# Patient Record
Sex: Female | Born: 1965 | Race: White | Hispanic: No | Marital: Married | Smoking: Current every day smoker
Health system: Southern US, Community
[De-identification: ages and names within clinical notes are randomized; demographics above are authoritative.]

## PROBLEM LIST (undated history)

## (undated) DIAGNOSIS — I219 Acute myocardial infarction, unspecified: Secondary | ICD-10-CM

## (undated) DIAGNOSIS — M5430 Sciatica, unspecified side: Secondary | ICD-10-CM

## (undated) DIAGNOSIS — M722 Plantar fascial fibromatosis: Secondary | ICD-10-CM

## (undated) DIAGNOSIS — M549 Dorsalgia, unspecified: Secondary | ICD-10-CM

## (undated) DIAGNOSIS — I10 Essential (primary) hypertension: Secondary | ICD-10-CM

## (undated) DIAGNOSIS — F419 Anxiety disorder, unspecified: Secondary | ICD-10-CM

## (undated) DIAGNOSIS — G8929 Other chronic pain: Secondary | ICD-10-CM

## (undated) HISTORY — PX: TUBAL LIGATION: SHX77

## (undated) HISTORY — DX: Sciatica, unspecified side: M54.30

## (undated) HISTORY — DX: Other chronic pain: M54.9

## (undated) HISTORY — DX: Other chronic pain: G89.29

## (undated) HISTORY — PX: APPENDECTOMY: SHX54

## (undated) HISTORY — PX: CARDIAC CATHETERIZATION: SHX172

## (undated) HISTORY — DX: Plantar fascial fibromatosis: M72.2

## (undated) HISTORY — DX: Anxiety disorder, unspecified: F41.9

## (undated) HISTORY — DX: Essential (primary) hypertension: I10

---

## 2000-10-20 ENCOUNTER — Emergency Department (HOSPITAL_COMMUNITY): Admission: EM | Admit: 2000-10-20 | Discharge: 2000-10-20 | Payer: Self-pay | Admitting: Dermatology

## 2003-05-11 ENCOUNTER — Emergency Department (HOSPITAL_COMMUNITY): Admission: EM | Admit: 2003-05-11 | Discharge: 2003-05-11 | Payer: Self-pay | Admitting: Emergency Medicine

## 2011-03-16 ENCOUNTER — Emergency Department (HOSPITAL_COMMUNITY)
Admission: EM | Admit: 2011-03-16 | Discharge: 2011-03-16 | Disposition: A | Discharge status: Home / Self Care | Payer: Medicaid Other | Attending: Emergency Medicine | Admitting: Emergency Medicine

## 2011-03-16 ENCOUNTER — Encounter: Payer: Self-pay | Admitting: *Deleted

## 2011-03-16 ENCOUNTER — Emergency Department (HOSPITAL_COMMUNITY): Payer: Medicaid Other

## 2011-03-16 ENCOUNTER — Other Ambulatory Visit: Payer: Self-pay

## 2011-03-16 DIAGNOSIS — R110 Nausea: Secondary | ICD-10-CM | POA: Insufficient documentation

## 2011-03-16 DIAGNOSIS — R079 Chest pain, unspecified: Secondary | ICD-10-CM | POA: Insufficient documentation

## 2011-03-16 DIAGNOSIS — R062 Wheezing: Secondary | ICD-10-CM

## 2011-03-16 DIAGNOSIS — R42 Dizziness and giddiness: Secondary | ICD-10-CM | POA: Insufficient documentation

## 2011-03-16 DIAGNOSIS — F17200 Nicotine dependence, unspecified, uncomplicated: Secondary | ICD-10-CM | POA: Insufficient documentation

## 2011-03-16 DIAGNOSIS — R0602 Shortness of breath: Secondary | ICD-10-CM | POA: Insufficient documentation

## 2011-03-16 DIAGNOSIS — I10 Essential (primary) hypertension: Secondary | ICD-10-CM | POA: Insufficient documentation

## 2011-03-16 LAB — CARDIAC PANEL(CRET KIN+CKTOT+MB+TROPI): Relative Index: INVALID (ref 0.0–2.5)

## 2011-03-16 LAB — CBC
HCT: 39.8 % (ref 36.0–46.0)
Hemoglobin: 13.2 g/dL (ref 12.0–15.0)
MCHC: 33.2 g/dL (ref 30.0–36.0)
MCV: 91.1 fL (ref 78.0–100.0)
RDW: 13.9 % (ref 11.5–15.5)
WBC: 7.5 10*3/uL (ref 4.0–10.5)

## 2011-03-16 LAB — BASIC METABOLIC PANEL
BUN: 8 mg/dL (ref 6–23)
CO2: 23 mEq/L (ref 19–32)
Calcium: 9.1 mg/dL (ref 8.4–10.5)
Chloride: 111 mEq/L (ref 96–112)
Creatinine, Ser: 0.67 mg/dL (ref 0.50–1.10)

## 2011-03-16 LAB — DIFFERENTIAL
Basophils Absolute: 0 10*3/uL (ref 0.0–0.1)
Eosinophils Relative: 4 % (ref 0–5)
Lymphocytes Relative: 25 % (ref 12–46)
Monocytes Absolute: 0.6 10*3/uL (ref 0.1–1.0)
Monocytes Relative: 8 % (ref 3–12)
Neutro Abs: 4.7 10*3/uL (ref 1.7–7.7)

## 2011-03-16 MED ORDER — ASPIRIN 81 MG PO CHEW: 324.0000 mg | CHEWABLE_TABLET | Freq: Once | ORAL | Status: DC

## 2011-03-16 MED ORDER — ALBUTEROL SULFATE (5 MG/ML) 0.5% IN NEBU
2.5000 mg | INHALATION_SOLUTION | Freq: Once | RESPIRATORY_TRACT | Status: AC
  Administered 2011-03-16: 2.5 mg via RESPIRATORY_TRACT
  Filled 2011-03-16: qty 0.5

## 2011-03-16 MED ORDER — ALBUTEROL SULFATE HFA 108 (90 BASE) MCG/ACT IN AERS: 1.0000 | INHALATION_SPRAY | Freq: Four times a day (QID) | RESPIRATORY_TRACT | Status: AC | PRN

## 2011-03-16 NOTE — ED Notes (Signed)
Pt awoken with left sided chest pain.  States has since resolved.  Per EMS pain did radiate to left side of neck.

## 2011-03-16 NOTE — ED Notes (Signed)
Edp at bedside °

## 2011-03-16 NOTE — ED Notes (Signed)
Paged respiratory 

## 2011-03-16 NOTE — ED Provider Notes (Signed)
Scribed for [employer]. Colon Branch, MD, the patient was seen in room APA14/APA14 . This chart was scribed by Ellie Lunch.   CSN: [id number] Arrival date & time: 03/16/2011  5:54 AM   First MD Initiated Contact with Patient 03/16/11 [phone]      Chief Complaint  Patient presents with  . Chest Pain    (Consider location/radiation/quality/duration/timing/severity/associated sxs/prior treatment) Patient is a 45 y.o. female presenting with chest pain. The history is provided by the patient. No language interpreter was used.  Chest Pain   Pt seen at 7:18 AM Jean Nelson is a 45 y.o. female brought in by ambulance, who presents to the Emergency Department complaining of 1 episode of left sided chest pain. Pt c/o a sharp, stabbing pain that awoke her ~3.5 hours ago. Pt was in supine position when pain started in her left axilla and radiated to her left arm.  Cp was associated with SOB, dizziness, and nausea. Pain was made worse by movement, but then resolved when she went for a walk outside. Pt denies vomiting, fever, or chills. Pt currently reports no CP. Additionally Pt reports some wheezing. Pt is a daily smoker ~1pack/day.   PCP Dr. Denzil Magnuson Past Medical History  Diagnosis Date  . Hypertension     History reviewed. No pertinent past surgical history.  No family history on file.  History  Substance Use Topics  . Smoking status: Current Everyday Smoker  . Smokeless tobacco: Not on file  . Alcohol Use: No    Review of Systems  All other systems reviewed and are negative.  10 Systems reviewed and are negative for acute change except as noted in the HPI.  Allergies  Codeine  Home Medications   Current Outpatient Rx  Name Route Sig Dispense Refill  . HYDROCODONE-ACETAMINOPHEN 5-325 MG PO TABS Oral Take 1 tablet by mouth every 6 (six) hours as needed.      . IBUPROFEN 800 MG PO TABS Oral Take 800 mg by mouth every 8 (eight) hours as needed.      Marland Kitchen LISINOPRIL 10 MG PO TABS Oral Take 20  mg by mouth daily.      . MELOXICAM 7.5 MG PO TABS Oral Take 7.5 mg by mouth daily.        BP 143/73  Pulse 68  Temp(Src) 98 F (36.7 C) (Oral)  Resp 18  Ht 5\' 3"  (1.6 m)  Wt 171 lb (77.565 kg)  BMI 30.29 kg/m2  SpO2 98%  LMP 02/16/2011  Physical Exam  Nursing note and vitals reviewed. Constitutional: She is oriented to person, place, and time. She appears well-developed and well-nourished.  HENT:  Right Ear: Tympanic membrane and external ear normal.  Left Ear: Tympanic membrane and external ear normal.  Mouth/Throat: Oropharynx is clear and moist.  Eyes: EOM are normal. Pupils are equal, round, and reactive to light.  Neck: Neck supple.  Cardiovascular: Normal rate, regular rhythm and normal heart sounds.  Exam reveals no gallop and no friction rub.   No murmur heard. Pulmonary/Chest: Effort normal. She has wheezes (right greater than left).  Abdominal: Soft. Bowel sounds are normal. There is no tenderness.  Musculoskeletal: Normal range of motion. She exhibits no edema.  Neurological: She is alert and oriented to person, place, and time.  Skin: Skin is warm and dry.    ED Course  Procedures (including critical care time) OTHER DATA REVIEWED: Nursing notes, vital signs, and past medical records reviewed.   DIAGNOSTIC STUDIES: Oxygen Saturation is 98%  on room air, normal by my interpretation.     Results for orders placed during the hospital encounter of 03/16/11  CBC      Component Value Range   WBC 7.5  4.0 - 10.5 (K/uL)   RBC 4.37  3.87 - 5.11 (MIL/uL)   Hemoglobin 13.2  12.0 - 15.0 (g/dL)   HCT 40.9  81.1 - 91.4 (%)   MCV 91.1  78.0 - 100.0 (fL)   MCH 30.2  26.0 - 34.0 (pg)   MCHC 33.2  30.0 - 36.0 (g/dL)   RDW 78.2  95.6 - 21.3 (%)   Platelets 205  150 - 400 (K/uL)  DIFFERENTIAL      Component Value Range   Neutrophils Relative 62  43 - 77 (%)   Neutro Abs 4.7  1.7 - 7.7 (K/uL)   Lymphocytes Relative 25  12 - 46 (%)   Lymphs Abs 1.9  0.7 - 4.0 (K/uL)     Monocytes Relative 8  3 - 12 (%)   Monocytes Absolute 0.6  0.1 - 1.0 (K/uL)   Eosinophils Relative 4  0 - 5 (%)   Eosinophils Absolute 0.3  0.0 - 0.7 (K/uL)   Basophils Relative 0  0 - 1 (%)   Basophils Absolute 0.0  0.0 - 0.1 (K/uL)  BASIC METABOLIC PANEL      Component Value Range   Sodium 141  135 - 145 (mEq/L)   Potassium 3.4 (*) 3.5 - 5.1 (mEq/L)   Chloride 111  96 - 112 (mEq/L)   CO2 23  19 - 32 (mEq/L)   Glucose, Bld 108 (*) 70 - 99 (mg/dL)   BUN 8  6 - 23 (mg/dL)   Creatinine, Ser 0.86  0.50 - 1.10 (mg/dL)   Calcium 9.1  8.4 - 57.8 (mg/dL)   GFR calc non Af Amer >90  >90 (mL/min)   GFR calc Af Amer >90  >90 (mL/min)  CARDIAC PANEL(CRET KIN+CKTOT+MB+TROPI)      Component Value Range   Total CK 97  7 - 177 (U/L)   CK, MB 2.6  0.3 - 4.0 (ng/mL)   Troponin I <0.30  <0.30 (ng/mL)   Relative Index RELATIVE INDEX IS INVALID  0.0 - 2.5    Dg Chest 2 View  03/16/2011  *RADIOLOGY REPORT*  Clinical Data: Chest pain.  CHEST - 2 VIEW  Comparison: Report from 05/11/2003  Findings: Cardiac and mediastinal contours appear normal.  The lungs appear clear.  No pleural effusion is identified.  IMPRESSION:  No significant abnormality identified.  Original Report Authenticated By: Dellia Cloud, M.D.    Date: 03/16/2011  0554  Rate: 78  Rhythm: normal sinus rhythm  QRS Axis: normal  Intervals: normal  ST/T Wave abnormalities: normal  Conduction Disutrbances:none  Narrative Interpretation:   Old EKG Reviewed: none available  ED MEDICATIONS  Medications  aspirin chewable tablet 324 mg (324 mg Oral Not Given 03/16/11 0615)  albuterol (PROVENTIL) (5 MG/ML) 0.5% nebulizer solution 2.5 mg (2.5 mg Nebulization Given 03/16/11 0834)   9:08 AM Pt recheck. Pt feels improved after breathing treatment. Discussed labs and xray look good. Plan to discharge with inhaler.     MDM  Patient with sharp left axillary pain that radiated to the left neck. Pain resolved on its own. PE with  wheezing throughout. Improved with albuterol. Pt feels improved after observation and/or treatment in ED.Pt stable in ED with no significant deterioration in condition.The patient appears reasonably screened and/or stabilized for discharge and  I doubt any other medical condition or other [facility] requiring further screening, evaluation, or treatment in the ED at this time prior to discharge. MDM Reviewed: nursing note and vitals Interpretation: labs, ECG and x-ray    I personally performed the services described in this documentation, which was scribed in my presence. The recorded information has been reviewed and considered.       Nicoletta Dress. Colon Branch, MD 03/16/11 [phone]

## 2013-11-29 ENCOUNTER — Telehealth (HOSPITAL_COMMUNITY): Payer: Self-pay

## 2014-01-18 ENCOUNTER — Ambulatory Visit (INDEPENDENT_AMBULATORY_CARE_PROVIDER_SITE_OTHER): Payer: 59 | Admitting: Psychiatry

## 2014-01-18 ENCOUNTER — Encounter (HOSPITAL_COMMUNITY): Payer: Self-pay | Admitting: Psychiatry

## 2014-01-18 VITALS — BP 180/90 | Ht 63.0 in | Wt 176.0 lb

## 2014-01-18 DIAGNOSIS — F909 Attention-deficit hyperactivity disorder, unspecified type: Secondary | ICD-10-CM

## 2014-01-18 DIAGNOSIS — F902 Attention-deficit hyperactivity disorder, combined type: Secondary | ICD-10-CM

## 2014-01-18 DIAGNOSIS — F411 Generalized anxiety disorder: Secondary | ICD-10-CM | POA: Insufficient documentation

## 2014-01-18 MED ORDER — AMPHETAMINE-DEXTROAMPHETAMINE 20 MG PO TABS: 20.0000 mg | ORAL_TABLET | Freq: Four times a day (QID) | ORAL | Status: DC

## 2014-01-18 MED ORDER — ALPRAZOLAM 2 MG PO TABS: 2.0000 mg | ORAL_TABLET | Freq: Two times a day (BID) | ORAL | Status: DC
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
## 2014-02-08 ENCOUNTER — Ambulatory Visit (INDEPENDENT_AMBULATORY_CARE_PROVIDER_SITE_OTHER): Payer: MEDICAID | Admitting: Psychiatry

## 2014-02-08 ENCOUNTER — Encounter (HOSPITAL_COMMUNITY): Payer: Self-pay | Admitting: Psychiatry

## 2014-02-08 DIAGNOSIS — F411 Generalized anxiety disorder: Secondary | ICD-10-CM

## 2014-02-08 DIAGNOSIS — F902 Attention-deficit hyperactivity disorder, combined type: Secondary | ICD-10-CM
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
## 2014-02-08 NOTE — Patient Instructions (Signed)
Discussed orally 

## 2014-02-15 ENCOUNTER — Ambulatory Visit (HOSPITAL_COMMUNITY): Payer: Self-pay | Admitting: Psychiatry

## 2014-02-15 ENCOUNTER — Ambulatory Visit (INDEPENDENT_AMBULATORY_CARE_PROVIDER_SITE_OTHER): Payer: MEDICAID | Admitting: Psychiatry

## 2014-02-15 ENCOUNTER — Encounter (HOSPITAL_COMMUNITY): Payer: Self-pay | Admitting: Psychiatry

## 2014-02-15 VITALS — BP 159/87 | HR 84 | Ht 63.0 in | Wt 177.0 lb

## 2014-02-15 DIAGNOSIS — F411 Generalized anxiety disorder: Secondary | ICD-10-CM

## 2014-02-15 DIAGNOSIS — F902 Attention-deficit hyperactivity disorder, combined type: Secondary | ICD-10-CM

## 2014-02-15 MED ORDER — ALPRAZOLAM 2 MG PO TABS: 2.0000 mg | ORAL_TABLET | Freq: Two times a day (BID) | ORAL | Status: DC

## 2014-02-15 MED ORDER — AMPHETAMINE-DEXTROAMPHETAMINE 20 MG PO TABS: 20.0000 mg | ORAL_TABLET | Freq: Four times a day (QID) | ORAL | Status: DC

## 2014-02-15 MED ORDER — BUPROPION HCL ER (XL) 150 MG PO TB24
150.0000 mg | ORAL_TABLET | ORAL | Status: DC
Start: 2014-02-15 — End: 2014-04-17
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
## 2014-03-06 ENCOUNTER — Ambulatory Visit (HOSPITAL_COMMUNITY): Payer: Self-pay | Admitting: Psychiatry

## 2014-03-27 ENCOUNTER — Ambulatory Visit (HOSPITAL_COMMUNITY): Payer: MEDICAID | Admitting: Psychiatry

## 2014-04-17 ENCOUNTER — Encounter (HOSPITAL_COMMUNITY): Payer: Self-pay | Admitting: Psychiatry

## 2014-04-17 ENCOUNTER — Ambulatory Visit (INDEPENDENT_AMBULATORY_CARE_PROVIDER_SITE_OTHER): Payer: MEDICAID | Admitting: Psychiatry

## 2014-04-17 VITALS — BP 166/96 | HR 60 | Ht 63.0 in | Wt 181.6 lb

## 2014-04-17 DIAGNOSIS — F902 Attention-deficit hyperactivity disorder, combined type: Secondary | ICD-10-CM

## 2014-04-17 DIAGNOSIS — F411 Generalized anxiety disorder: Secondary | ICD-10-CM

## 2014-04-17 MED ORDER — AMPHETAMINE-DEXTROAMPHETAMINE 20 MG PO TABS: 20.0000 mg | ORAL_TABLET | Freq: Four times a day (QID) | ORAL | Status: DC

## 2014-04-17 MED ORDER — ALPRAZOLAM 2 MG PO TABS: 2.0000 mg | ORAL_TABLET | Freq: Two times a day (BID) | ORAL | Status: DC

## 2014-04-17 MED ORDER — AMPHETAMINE-DEXTROAMPHETAMINE 20 MG PO TABS: 20.0000 mg | ORAL_TABLET | Freq: Every day | ORAL | Status: DC

## 2014-04-17 MED ORDER — BUPROPION HCL ER (XL) 150 MG PO TB24: 150.0000 mg | ORAL_TABLET | ORAL | Status: DC
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
## 2014-06-08 ENCOUNTER — Telehealth (HOSPITAL_COMMUNITY): Payer: Self-pay | Admitting: *Deleted

## 2014-06-08 ENCOUNTER — Other Ambulatory Visit (HOSPITAL_COMMUNITY): Payer: Self-pay | Admitting: Psychiatry

## 2014-06-08 MED ORDER — AMPHETAMINE-DEXTROAMPHETAMINE 20 MG PO TABS: 20.0000 mg | ORAL_TABLET | Freq: Four times a day (QID) | ORAL | Status: DC

## 2014-06-08 NOTE — Telephone Encounter (Signed)
last prescription written ( Adderall) can't be filled. patient says she take 4 a day.  script says take 1 a day.

## 2014-06-08 NOTE — Telephone Encounter (Signed)
reprinted

## 2014-06-08 NOTE — Telephone Encounter (Signed)
Pt is aware printed script is ready for pick up and per pt she will come by office 06-11-14 to pick up printed script

## 2014-06-11 ENCOUNTER — Telehealth (HOSPITAL_COMMUNITY): Payer: Self-pay | Admitting: *Deleted

## 2014-06-11 NOTE — Telephone Encounter (Signed)
Pt came to pick up printed script. Pt D/L is [id number]. Pt also dropped off extra printed script for her Adderall that had the wrong SIG. Pt agreed with printed script.

## 2014-07-17 ENCOUNTER — Ambulatory Visit (INDEPENDENT_AMBULATORY_CARE_PROVIDER_SITE_OTHER): Payer: MEDICAID | Admitting: Psychiatry

## 2014-07-17 ENCOUNTER — Encounter (HOSPITAL_COMMUNITY): Payer: Self-pay | Admitting: Psychiatry

## 2014-07-17 VITALS — BP 178/100 | HR 76 | Ht 63.0 in | Wt 183.0 lb

## 2014-07-17 DIAGNOSIS — F902 Attention-deficit hyperactivity disorder, combined type: Secondary | ICD-10-CM

## 2014-07-17 DIAGNOSIS — F411 Generalized anxiety disorder: Secondary | ICD-10-CM

## 2014-07-17 MED ORDER — ALPRAZOLAM 2 MG PO TABS: 2.0000 mg | ORAL_TABLET | Freq: Two times a day (BID) | ORAL | Status: DC

## 2014-07-17 MED ORDER — AMPHETAMINE-DEXTROAMPHETAMINE 20 MG PO TABS: 20.0000 mg | ORAL_TABLET | Freq: Four times a day (QID) | ORAL | Status: DC

## 2014-07-17 MED ORDER — BUPROPION HCL ER (XL) 150 MG PO TB24: 150.0000 mg | ORAL_TABLET | ORAL | Status: DC
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
## 2014-10-09 ENCOUNTER — Ambulatory Visit (INDEPENDENT_AMBULATORY_CARE_PROVIDER_SITE_OTHER): Payer: MEDICAID | Admitting: Psychiatry

## 2014-10-09 ENCOUNTER — Encounter (HOSPITAL_COMMUNITY): Payer: Self-pay | Admitting: Psychiatry

## 2014-10-09 VITALS — BP 171/99 | HR 80 | Ht 63.0 in | Wt 176.4 lb

## 2014-10-09 DIAGNOSIS — F411 Generalized anxiety disorder: Secondary | ICD-10-CM

## 2014-10-09 DIAGNOSIS — F902 Attention-deficit hyperactivity disorder, combined type: Secondary | ICD-10-CM | POA: Diagnosis not present

## 2014-10-09 MED ORDER — AMPHETAMINE-DEXTROAMPHETAMINE 20 MG PO TABS: 20.0000 mg | ORAL_TABLET | Freq: Four times a day (QID) | ORAL | Status: DC

## 2014-10-09 MED ORDER — ALPRAZOLAM 2 MG PO TABS: 2.0000 mg | ORAL_TABLET | Freq: Two times a day (BID) | ORAL | Status: DC

## 2014-10-09 MED ORDER — BUPROPION HCL ER (XL) 150 MG PO TB24: 150.0000 mg | ORAL_TABLET | ORAL | Status: DC
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
## 2014-10-16 ENCOUNTER — Ambulatory Visit (HOSPITAL_COMMUNITY): Payer: Self-pay | Admitting: Psychiatry

## 2015-01-09 ENCOUNTER — Encounter (HOSPITAL_COMMUNITY): Payer: Self-pay | Admitting: Psychiatry

## 2015-01-09 ENCOUNTER — Ambulatory Visit (INDEPENDENT_AMBULATORY_CARE_PROVIDER_SITE_OTHER): Payer: Medicaid Other | Admitting: Psychiatry

## 2015-01-09 VITALS — BP 165/91 | HR 81 | Ht 63.0 in | Wt 172.2 lb

## 2015-01-09 DIAGNOSIS — F902 Attention-deficit hyperactivity disorder, combined type: Secondary | ICD-10-CM

## 2015-01-09 DIAGNOSIS — F411 Generalized anxiety disorder: Secondary | ICD-10-CM

## 2015-01-09 MED ORDER — BUPROPION HCL ER (XL) 150 MG PO TB24: 150.0000 mg | ORAL_TABLET | ORAL | Status: DC

## 2015-01-09 MED ORDER — AMPHETAMINE-DEXTROAMPHETAMINE 20 MG PO TABS: 20.0000 mg | ORAL_TABLET | Freq: Four times a day (QID) | ORAL | Status: DC

## 2015-01-09 MED ORDER — ALPRAZOLAM 2 MG PO TABS: 2.0000 mg | ORAL_TABLET | Freq: Two times a day (BID) | ORAL | Status: DC
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
## 2015-04-10 ENCOUNTER — Encounter (HOSPITAL_COMMUNITY): Payer: Self-pay | Admitting: Psychiatry

## 2015-04-10 ENCOUNTER — Ambulatory Visit (INDEPENDENT_AMBULATORY_CARE_PROVIDER_SITE_OTHER): Payer: Medicaid Other | Admitting: Psychiatry

## 2015-04-10 VITALS — BP 179/123 | HR 77 | Ht 63.0 in | Wt 172.8 lb

## 2015-04-10 DIAGNOSIS — F902 Attention-deficit hyperactivity disorder, combined type: Secondary | ICD-10-CM | POA: Diagnosis not present

## 2015-04-10 DIAGNOSIS — F411 Generalized anxiety disorder: Secondary | ICD-10-CM

## 2015-04-10 MED ORDER — ALPRAZOLAM 2 MG PO TABS: 2.0000 mg | ORAL_TABLET | Freq: Two times a day (BID) | ORAL | Status: DC

## 2015-04-10 MED ORDER — BUPROPION HCL ER (XL) 150 MG PO TB24: 150.0000 mg | ORAL_TABLET | ORAL | Status: DC

## 2015-04-10 MED ORDER — AMPHETAMINE-DEXTROAMPHETAMINE 20 MG PO TABS: 20.0000 mg | ORAL_TABLET | Freq: Four times a day (QID) | ORAL | Status: DC
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
## 2015-07-08 ENCOUNTER — Telehealth (HOSPITAL_COMMUNITY): Payer: Self-pay | Admitting: *Deleted

## 2015-07-09 ENCOUNTER — Ambulatory Visit (HOSPITAL_COMMUNITY): Payer: Self-pay | Admitting: Psychiatry

## 2015-07-10 ENCOUNTER — Other Ambulatory Visit (HOSPITAL_COMMUNITY): Payer: Self-pay | Admitting: Psychiatry

## 2015-07-11 ENCOUNTER — Telehealth (HOSPITAL_COMMUNITY): Payer: Self-pay | Admitting: *Deleted

## 2015-07-11 ENCOUNTER — Other Ambulatory Visit (HOSPITAL_COMMUNITY): Payer: Self-pay | Admitting: Psychiatry

## 2015-07-11 MED ORDER — BUPROPION HCL ER (XL) 150 MG PO TB24: 150.0000 mg | ORAL_TABLET | ORAL | Status: DC

## 2015-07-11 MED ORDER — ALPRAZOLAM 2 MG PO TABS: 2.0000 mg | ORAL_TABLET | Freq: Two times a day (BID) | ORAL | Status: DC

## 2015-07-11 MED ORDER — AMPHETAMINE-DEXTROAMPHETAMINE 20 MG PO TABS: 20.0000 mg | ORAL_TABLET | Freq: Four times a day (QID) | ORAL | Status: DC

## 2015-07-11 NOTE — Telephone Encounter (Signed)
phone call from patient.   her appointment was rescheduled and she need refills.

## 2015-07-11 NOTE — Telephone Encounter (Signed)
Pt is aware scripts are ready for pick up

## 2015-07-11 NOTE — Telephone Encounter (Signed)
Pt called stating she is out of her Adderall, Xanax and Wellbutrin. Pt appt from 07-09-15 was resch due to provider out of office. Pt f/u appt is scheduled for 07-24-15.

## 2015-07-11 NOTE — Telephone Encounter (Signed)
wellbutrin sent. She will need to pick up the others

## 2015-07-12 ENCOUNTER — Encounter (HOSPITAL_COMMUNITY): Payer: Self-pay | Admitting: *Deleted

## 2015-07-12 NOTE — Progress Notes (Signed)
Pt came into office to pick up printed scripts for Xanax and Adderall. Pt D/L number is [phone] with expiration date of 12-21-15. Pt showed understanding and agreed with scripts.

## 2015-07-24 ENCOUNTER — Ambulatory Visit (HOSPITAL_COMMUNITY): Payer: Self-pay | Admitting: Psychiatry

## 2015-07-25 ENCOUNTER — Encounter (HOSPITAL_COMMUNITY): Payer: Self-pay | Admitting: Psychiatry

## 2015-07-31 ENCOUNTER — Encounter (HOSPITAL_COMMUNITY): Payer: Self-pay | Admitting: Psychiatry

## 2015-07-31 ENCOUNTER — Ambulatory Visit (INDEPENDENT_AMBULATORY_CARE_PROVIDER_SITE_OTHER): Payer: Medicaid Other | Admitting: Psychiatry

## 2015-07-31 VITALS — BP 170/99 | HR 84 | Ht 63.0 in | Wt 169.0 lb

## 2015-07-31 DIAGNOSIS — F902 Attention-deficit hyperactivity disorder, combined type: Secondary | ICD-10-CM | POA: Diagnosis not present

## 2015-07-31 DIAGNOSIS — F411 Generalized anxiety disorder: Secondary | ICD-10-CM

## 2015-07-31 MED ORDER — AMPHETAMINE-DEXTROAMPHETAMINE 20 MG PO TABS: 20.0000 mg | ORAL_TABLET | Freq: Four times a day (QID) | ORAL | Status: DC

## 2015-07-31 MED ORDER — BUPROPION HCL ER (XL) 150 MG PO TB24: 150.0000 mg | ORAL_TABLET | ORAL | Status: DC

## 2015-07-31 MED ORDER — ALPRAZOLAM 2 MG PO TABS: 2.0000 mg | ORAL_TABLET | Freq: Two times a day (BID) | ORAL | Status: DC
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
## 2015-10-19 ENCOUNTER — Other Ambulatory Visit (HOSPITAL_COMMUNITY): Payer: Self-pay | Admitting: Psychiatry

## 2015-10-31 ENCOUNTER — Encounter (HOSPITAL_COMMUNITY): Payer: Self-pay | Admitting: Psychiatry

## 2015-10-31 ENCOUNTER — Ambulatory Visit (INDEPENDENT_AMBULATORY_CARE_PROVIDER_SITE_OTHER): Payer: Medicaid Other | Admitting: Psychiatry

## 2015-10-31 VITALS — BP 174/106 | HR 88 | Ht 63.0 in | Wt 164.6 lb

## 2015-10-31 DIAGNOSIS — F902 Attention-deficit hyperactivity disorder, combined type: Secondary | ICD-10-CM | POA: Diagnosis not present

## 2015-10-31 MED ORDER — AMPHETAMINE-DEXTROAMPHETAMINE 20 MG PO TABS: 20.0000 mg | ORAL_TABLET | Freq: Three times a day (TID) | ORAL | Status: DC

## 2015-10-31 MED ORDER — ALPRAZOLAM 2 MG PO TABS: 2.0000 mg | ORAL_TABLET | Freq: Two times a day (BID) | ORAL | Status: DC

## 2015-10-31 MED ORDER — BUPROPION HCL ER (XL) 150 MG PO TB24: 150.0000 mg | ORAL_TABLET | ORAL | Status: DC
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
## 2015-12-27 ENCOUNTER — Ambulatory Visit (INDEPENDENT_AMBULATORY_CARE_PROVIDER_SITE_OTHER): Payer: Medicaid Other | Admitting: Psychiatry

## 2015-12-27 ENCOUNTER — Encounter (HOSPITAL_COMMUNITY): Payer: Self-pay | Admitting: Psychiatry

## 2015-12-27 VITALS — BP 146/85 | HR 81 | Ht 63.0 in | Wt 165.8 lb

## 2015-12-27 DIAGNOSIS — F902 Attention-deficit hyperactivity disorder, combined type: Secondary | ICD-10-CM | POA: Diagnosis not present

## 2015-12-27 MED ORDER — BUPROPION HCL ER (XL) 150 MG PO TB24: 150.0000 mg | ORAL_TABLET | ORAL | 2 refills | Status: DC

## 2015-12-27 MED ORDER — AMPHETAMINE-DEXTROAMPHETAMINE 20 MG PO TABS: 20.0000 mg | ORAL_TABLET | Freq: Three times a day (TID) | ORAL | 0 refills | Status: DC
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
## 2015-12-31 ENCOUNTER — Ambulatory Visit (HOSPITAL_COMMUNITY): Payer: Self-pay | Admitting: Psychiatry

## 2016-01-22 ENCOUNTER — Telehealth (HOSPITAL_COMMUNITY): Payer: Self-pay | Admitting: *Deleted

## 2016-01-22 NOTE — Telephone Encounter (Signed)
lmtcb

## 2016-01-22 NOTE — Telephone Encounter (Signed)
returned phone call, not mentioned what call was about.  no answer, left voice message.

## 2016-01-27 ENCOUNTER — Telehealth (HOSPITAL_COMMUNITY): Payer: Self-pay | Admitting: *Deleted

## 2016-01-27 NOTE — Telephone Encounter (Signed)
Pt called stating she need refills for her Xanax. Per pt the pharmacy do not have refills on file. Called pt pharmacy and spoke with Arlys JohnBrian the pharmacist. Per Arlys JohnBrian, the last written script they received for pt was 07-09-2015 which was filled on 10-19-2015 and pt is out of refills. Called pt and informed her that per her chart, Dr. Tenny Crawoss printed Xanax script for her on 10-31-2015 and to see if she can locate that script. Per pt, she will look around to see if she can find script and call office back tomorrow if she can not find it. Staff agreed.

## 2016-02-03 ENCOUNTER — Other Ambulatory Visit (HOSPITAL_COMMUNITY): Payer: Self-pay | Admitting: Psychiatry

## 2016-02-06 ENCOUNTER — Telehealth (HOSPITAL_COMMUNITY): Payer: Self-pay | Admitting: *Deleted

## 2016-02-10 ENCOUNTER — Telehealth (HOSPITAL_COMMUNITY): Payer: Self-pay | Admitting: *Deleted

## 2016-02-10 MED ORDER — ALPRAZOLAM 2 MG PO TABS: 2.0000 mg | ORAL_TABLET | Freq: Two times a day (BID) | ORAL | 0 refills | Status: DC

## 2016-02-10 NOTE — Telephone Encounter (Signed)
Per Dr. Tenny Crawoss to call in 30days refills for pt. Ca;;ed pharmacy and spoke with [facility]. Pt is aware

## 2016-02-10 NOTE — Telephone Encounter (Signed)
You may call in one month supply, she has appt 11/3

## 2016-02-10 NOTE — Telephone Encounter (Signed)
Called medication into pt pharmacy and spoke with Herbert SetaHeather ([facility] [place]).

## 2016-02-10 NOTE — Telephone Encounter (Signed)
Pt calling stating she is out of her Xanax. Per pt, he last fill was 01-07-2016 and not she is out of tablets. Per pt chart, her Xanax was last filled on 10-31-2015 with 60 tablets 2 refills. [facility] and pt phone number is [phone].

## 2016-03-06 ENCOUNTER — Ambulatory Visit (INDEPENDENT_AMBULATORY_CARE_PROVIDER_SITE_OTHER): Payer: Medicaid Other | Admitting: Psychiatry

## 2016-03-06 ENCOUNTER — Encounter (HOSPITAL_COMMUNITY): Payer: Self-pay | Admitting: Psychiatry

## 2016-03-06 VITALS — BP 181/105 | HR 81 | Ht 63.0 in | Wt 168.2 lb

## 2016-03-06 DIAGNOSIS — F411 Generalized anxiety disorder: Secondary | ICD-10-CM | POA: Diagnosis not present

## 2016-03-06 DIAGNOSIS — F902 Attention-deficit hyperactivity disorder, combined type: Secondary | ICD-10-CM | POA: Diagnosis not present

## 2016-03-06 DIAGNOSIS — Z811 Family history of alcohol abuse and dependence: Secondary | ICD-10-CM | POA: Diagnosis not present

## 2016-03-06 DIAGNOSIS — Z818 Family history of other mental and behavioral disorders: Secondary | ICD-10-CM

## 2016-03-06 DIAGNOSIS — Z79899 Other long term (current) drug therapy: Secondary | ICD-10-CM

## 2016-03-06 MED ORDER — ALPRAZOLAM 1 MG PO TABS: 1.0000 mg | ORAL_TABLET | Freq: Three times a day (TID) | ORAL | 0 refills | Status: DC

## 2016-03-06 MED ORDER — BUPROPION HCL ER (XL) 150 MG PO TB24: 150.0000 mg | ORAL_TABLET | ORAL | 2 refills | Status: DC

## 2016-03-06 MED ORDER — AMPHETAMINE-DEXTROAMPHETAMINE 20 MG PO TABS: 20.0000 mg | ORAL_TABLET | Freq: Three times a day (TID) | ORAL | 0 refills | Status: DC

## 2016-03-06 MED ORDER — BUSPIRONE HCL 10 MG PO TABS: 10.0000 mg | ORAL_TABLET | Freq: Three times a day (TID) | ORAL | 2 refills | Status: DC
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
## 2016-03-24 ENCOUNTER — Encounter (HOSPITAL_COMMUNITY): Payer: Self-pay | Admitting: Psychiatry

## 2016-03-24 ENCOUNTER — Ambulatory Visit (INDEPENDENT_AMBULATORY_CARE_PROVIDER_SITE_OTHER): Payer: Medicaid Other | Admitting: Psychiatry

## 2016-03-24 VITALS — BP 175/115 | HR 77 | Wt 166.4 lb

## 2016-03-24 DIAGNOSIS — F902 Attention-deficit hyperactivity disorder, combined type: Secondary | ICD-10-CM | POA: Diagnosis not present

## 2016-03-24 DIAGNOSIS — F411 Generalized anxiety disorder: Secondary | ICD-10-CM

## 2016-03-24 DIAGNOSIS — Z79899 Other long term (current) drug therapy: Secondary | ICD-10-CM | POA: Diagnosis not present

## 2016-03-24 DIAGNOSIS — Z818 Family history of other mental and behavioral disorders: Secondary | ICD-10-CM

## 2016-03-24 DIAGNOSIS — Z811 Family history of alcohol abuse and dependence: Secondary | ICD-10-CM | POA: Diagnosis not present

## 2016-03-24 MED ORDER — ALPRAZOLAM 1 MG PO TABS: 1.0000 mg | ORAL_TABLET | Freq: Three times a day (TID) | ORAL | 2 refills | Status: DC
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
## 2016-04-09 ENCOUNTER — Telehealth (HOSPITAL_COMMUNITY): Payer: Self-pay | Admitting: *Deleted

## 2016-04-09 NOTE — Telephone Encounter (Signed)
Prior authorization for Alprazolam received. Called [place] [facility] was told by Billey ChangJulianne that no authorization is required for generic Xanax. Called to notify pharmacy. It did go through without problems.

## 2016-04-10 NOTE — Telephone Encounter (Signed)
noted 

## 2016-05-22 ENCOUNTER — Telehealth (HOSPITAL_COMMUNITY): Payer: Self-pay | Admitting: *Deleted

## 2016-05-22 ENCOUNTER — Other Ambulatory Visit (HOSPITAL_COMMUNITY): Payer: Self-pay | Admitting: Psychiatry

## 2016-05-22 ENCOUNTER — Ambulatory Visit (HOSPITAL_COMMUNITY): Payer: Self-pay | Admitting: Psychiatry

## 2016-05-22 MED ORDER — AMPHETAMINE-DEXTROAMPHETAMINE 20 MG PO TABS: 20.0000 mg | ORAL_TABLET | Freq: Three times a day (TID) | ORAL | 0 refills | Status: DC

## 2016-05-22 MED ORDER — ALPRAZOLAM 1 MG PO TABS: 1.0000 mg | ORAL_TABLET | Freq: Three times a day (TID) | ORAL | 2 refills | Status: DC

## 2016-05-22 NOTE — Telephone Encounter (Signed)
patient was scheduled for this a.m.   she need refill of Adderall & Xanax.

## 2016-05-22 NOTE — Telephone Encounter (Signed)
printed

## 2016-05-25 ENCOUNTER — Encounter (HOSPITAL_COMMUNITY): Payer: Self-pay | Admitting: *Deleted

## 2016-05-25 NOTE — Progress Notes (Signed)
Pt came into office to pick up her printed script for Adderall and Xanax. Adderall ID number is [id number] and Order ID number is [id number] and pt Xanax ID number is [id number] and order ID number is [id number]. Pt D.L number is [id number] and expiration number is 12-21-2015. Pt showed understanding for both medications.

## 2016-05-26 NOTE — Telephone Encounter (Signed)
Pt came into office to pick up her printed script yesterday 05-25-2016

## 2016-06-01 ENCOUNTER — Inpatient Hospital Stay (HOSPITAL_COMMUNITY)
Admission: EM | Admit: 2016-06-01 | Discharge: 2016-06-03 | DRG: 690 | Disposition: A | Discharge status: Home / Self Care | Payer: Medicaid Other | Attending: Family Medicine | Admitting: Family Medicine

## 2016-06-01 ENCOUNTER — Emergency Department (HOSPITAL_COMMUNITY): Payer: Medicaid Other

## 2016-06-01 ENCOUNTER — Encounter (HOSPITAL_COMMUNITY): Payer: Self-pay | Admitting: Emergency Medicine

## 2016-06-01 DIAGNOSIS — I700 Atherosclerosis of aorta: Secondary | ICD-10-CM | POA: Diagnosis present

## 2016-06-01 DIAGNOSIS — Z791 Long term (current) use of non-steroidal anti-inflammatories (NSAID): Secondary | ICD-10-CM | POA: Diagnosis not present

## 2016-06-01 DIAGNOSIS — Z818 Family history of other mental and behavioral disorders: Secondary | ICD-10-CM | POA: Diagnosis not present

## 2016-06-01 DIAGNOSIS — E279 Disorder of adrenal gland, unspecified: Secondary | ICD-10-CM | POA: Diagnosis present

## 2016-06-01 DIAGNOSIS — E876 Hypokalemia: Secondary | ICD-10-CM | POA: Diagnosis present

## 2016-06-01 DIAGNOSIS — N10 Acute tubulo-interstitial nephritis: Principal | ICD-10-CM | POA: Diagnosis present

## 2016-06-01 DIAGNOSIS — N12 Tubulo-interstitial nephritis, not specified as acute or chronic: Secondary | ICD-10-CM | POA: Diagnosis present

## 2016-06-01 DIAGNOSIS — Z885 Allergy status to narcotic agent status: Secondary | ICD-10-CM | POA: Diagnosis not present

## 2016-06-01 DIAGNOSIS — I10 Essential (primary) hypertension: Secondary | ICD-10-CM | POA: Diagnosis present

## 2016-06-01 DIAGNOSIS — N151 Renal and perinephric abscess: Secondary | ICD-10-CM | POA: Diagnosis present

## 2016-06-01 DIAGNOSIS — Z79891 Long term (current) use of opiate analgesic: Secondary | ICD-10-CM

## 2016-06-01 DIAGNOSIS — Z79899 Other long term (current) drug therapy: Secondary | ICD-10-CM | POA: Diagnosis not present

## 2016-06-01 DIAGNOSIS — R1030 Lower abdominal pain, unspecified: Secondary | ICD-10-CM | POA: Diagnosis present

## 2016-06-01 DIAGNOSIS — F902 Attention-deficit hyperactivity disorder, combined type: Secondary | ICD-10-CM | POA: Diagnosis present

## 2016-06-01 DIAGNOSIS — F17200 Nicotine dependence, unspecified, uncomplicated: Secondary | ICD-10-CM | POA: Diagnosis present

## 2016-06-01 DIAGNOSIS — N179 Acute kidney failure, unspecified: Secondary | ICD-10-CM | POA: Diagnosis present

## 2016-06-01 DIAGNOSIS — F411 Generalized anxiety disorder: Secondary | ICD-10-CM | POA: Diagnosis present

## 2016-06-01 LAB — COMPREHENSIVE METABOLIC PANEL
ALT: 10 U/L — ABNORMAL LOW (ref 14–54)
AST: 14 U/L — ABNORMAL LOW (ref 15–41)
Albumin: 2.8 g/dL — ABNORMAL LOW (ref 3.5–5.0)
Alkaline Phosphatase: 82 U/L (ref 38–126)
Anion gap: 12 (ref 5–15)
BUN: 19 mg/dL (ref 6–20)
CHLORIDE: 98 mmol/L — AB (ref 101–111)
CO2: 26 mmol/L (ref 22–32)
CREATININE: 1.5 mg/dL — AB (ref 0.44–1.00)
Calcium: 8.1 mg/dL — ABNORMAL LOW (ref 8.9–10.3)
GFR, EST AFRICAN AMERICAN: 47 mL/min — AB (ref >60)
GFR, EST NON AFRICAN AMERICAN: 40 mL/min — AB (ref >60)
Glucose, Bld: 101 mg/dL — ABNORMAL HIGH (ref 65–99)
POTASSIUM: 2.3 mmol/L — AB (ref 3.5–5.1)
SODIUM: 136 mmol/L (ref 135–145)
Total Bilirubin: 0.4 mg/dL (ref 0.3–1.2)
Total Protein: 6.8 g/dL (ref 6.5–8.1)

## 2016-06-01 LAB — URINALYSIS, ROUTINE W REFLEX MICROSCOPIC
Bilirubin Urine: NEGATIVE
Glucose, UA: NEGATIVE mg/dL
Ketones, ur: NEGATIVE mg/dL
Nitrite: NEGATIVE
PROTEIN: 100 mg/dL — AB
SPECIFIC GRAVITY, URINE: 1.016 (ref 1.005–1.030)
pH: 5 (ref 5.0–8.0)

## 2016-06-01 LAB — CBC
HEMATOCRIT: 40.9 % (ref 36.0–46.0)
HEMOGLOBIN: 14 g/dL (ref 12.0–15.0)
MCH: 32.5 pg (ref 26.0–34.0)
MCHC: 34.2 g/dL (ref 30.0–36.0)
MCV: 94.9 fL (ref 78.0–100.0)
PLATELETS: 253 10*3/uL (ref 150–400)
RBC: 4.31 MIL/uL (ref 3.87–5.11)
RDW: 15.3 % (ref 11.5–15.5)
WBC: 15.3 10*3/uL — AB (ref 4.0–10.5)

## 2016-06-01 LAB — LIPASE, BLOOD: LIPASE: 11 U/L (ref 11–51)

## 2016-06-01 LAB — MAGNESIUM: MAGNESIUM: 1.8 mg/dL (ref 1.7–2.4)

## 2016-06-01 MED ORDER — SODIUM CHLORIDE 0.9 % IV BOLUS (SEPSIS)
1000.0000 mL | Freq: Once | INTRAVENOUS | Status: AC
  Administered 2016-06-01: 1000 mL via INTRAVENOUS

## 2016-06-01 MED ORDER — IOPAMIDOL (ISOVUE-300) INJECTION 61%
INTRAVENOUS | Status: AC
  Filled 2016-06-01: qty 30

## 2016-06-01 MED ORDER — DEXTROSE 5 % IV SOLN
INTRAVENOUS | Status: AC
  Filled 2016-06-01: qty 10

## 2016-06-01 MED ORDER — POTASSIUM CHLORIDE 20 MEQ/15ML (10%) PO SOLN
40.0000 meq | Freq: Once | ORAL | Status: AC
  Administered 2016-06-01: 40 meq via ORAL
  Filled 2016-06-01: qty 30

## 2016-06-01 MED ORDER — IOPAMIDOL (ISOVUE-300) INJECTION 61%
80.0000 mL | Freq: Once | INTRAVENOUS | Status: AC | PRN
  Administered 2016-06-01: 80 mL via INTRAVENOUS

## 2016-06-01 MED ORDER — POTASSIUM CHLORIDE 10 MEQ/100ML IV SOLN
10.0000 meq | INTRAVENOUS | Status: AC
  Administered 2016-06-01 (×3): 10 meq via INTRAVENOUS
  Filled 2016-06-01 (×3): qty 100

## 2016-06-01 MED ORDER — DEXTROSE 5 % IV SOLN
1.0000 g | Freq: Once | INTRAVENOUS | Status: AC
  Administered 2016-06-01: 1 g via INTRAVENOUS
  Filled 2016-06-01: qty 10

## 2016-06-01 MED ORDER — SODIUM CHLORIDE 0.9 % IV BOLUS (SEPSIS): 1000.0000 mL | Freq: Once | INTRAVENOUS | Status: DC

## 2016-06-01 NOTE — ED Notes (Signed)
CRITICAL VALUE ALERT  Critical value received:  Potassium 2.3  Date of notification:  06/01/16  Time of notification:  1428  Critical value read back:Yes.    Nurse who received alert:  RMinter, RN  MD notified (1st page):  Dr. Jodi MourningZavitz  Time of first page:  1429  MD notified (2nd page):  Time of second page:  Responding MD:  Dr. Jodi MourningZavitz  Time MD responded:  [phone]

## 2016-06-01 NOTE — ED Triage Notes (Signed)
Pt stated went to doctor be worked up for diverticulitis.  Pt states was called about lab work and was told to come to ED. WBC low.

## 2016-06-01 NOTE — ED Provider Notes (Signed)
[facility]EM[facility]Y DEPT Provider Note   CSN: [id number] Arrival date & time: 06/01/16  1156     History   Chief Complaint Chief Complaint  Patient presents with  . Abnormal Lab    HPI Jean Nelson is a 51 y.o. female.  HPI 51 year old female presents today to being seen by primary care for diverticulitis yesterday. She was started on antibiotics. She was called by her doctor and told that she had abnormal lab work and to come to the ED. He states that she's been having some lower abdominal pain for several days. She is having nausea but no vomiting, diarrhea, fever, or chills. She has had some increased frequency of urination. She denies similar symptoms in the past.  Past Medical History:  Diagnosis Date  . Back pain   . Hypertension     Patient Active Problem List   Diagnosis Date Noted  . ADHD (attention deficit hyperactivity disorder), combined type 01/18/2014  . Generalized anxiety disorder 01/18/2014    Past Surgical History:  Procedure Laterality Date  . APPENDECTOMY    . TUBAL LIGATION      OB History    No data available       Home Medications    Prior to Admission medications   Medication Sig Start Date End Date Taking? Authorizing Provider  albuterol (PROVENTIL HFA;VENTOLIN HFA) 108 (90 BASE) MCG/ACT inhaler Inhale 1-2 puffs into the lungs every 6 (six) hours as needed for wheezing (Use the inhaler for the next 4 days 4 times a day then begin using only if needed for wheezing). 03/16/11 06/01/16 Yes Annamarie Dawley, MD  ALPRAZolam Prudy Feeler) 1 MG tablet Take 1 tablet (1 mg total) by mouth 3 (three) times daily. 05/22/16 05/22/17 Yes Myrlene Broker, MD  amLODipine (NORVASC) 5 MG tablet Take 5 mg by mouth daily.   Yes Historical Provider, MD  amphetamine-dextroamphetamine (ADDERALL) 20 MG tablet Take 1 tablet (20 mg total) by mouth 3 (three) times daily. 03/06/16  Yes Myrlene Broker, MD  buPROPion (WELLBUTRIN XL) 150 MG 24 hr tablet Take 1 tablet (150 mg total) by  mouth every morning. 03/06/16 03/06/17 Yes Myrlene Broker, MD  busPIRone (BUSPAR) 10 MG tablet Take 1 tablet (10 mg total) by mouth 3 (three) times daily. 03/06/16  Yes Myrlene Broker, MD  ciprofloxacin (CIPRO) 500 MG tablet Take 500 mg by mouth 2 (two) times daily. 10 day course starting on 05/31/2016 05/31/16  Yes Historical Provider, MD  cyclobenzaprine (FLEXERIL) 10 MG tablet Take 1 tablet by mouth 3 (three) times daily as needed for muscle spasms.  05/26/16  Yes Historical Provider, MD  HYDROcodone-acetaminophen (NORCO/VICODIN) 5-325 MG tablet Take 1 tablet by mouth 3 (three) times daily. 12/21/15  Yes Historical Provider, MD  Ketorolac Tromethamine (TORADOL IJ) Inject as directed once.   Yes Historical Provider, MD  lisinopril (PRINIVIL,ZESTRIL) 20 MG tablet Take 30 mg by mouth at bedtime.    Yes Historical Provider, MD  meloxicam (MOBIC) 15 MG tablet Take 15 mg by mouth daily. 05/31/16  Yes Historical Provider, MD  metoprolol tartrate (LOPRESSOR) 25 MG tablet Take 25 mg by mouth 2 (two) times daily.   Yes Historical Provider, MD  metroNIDAZOLE (FLAGYL) 500 MG tablet Take 500 mg by mouth 3 (three) times daily. 10 day course starting on 05/31/2016 05/31/16  Yes Historical Provider, MD  potassium chloride (K-DUR) 10 MEQ tablet Take 10 mEq by mouth daily.   Yes Historical Provider, MD  traZODone (DESYREL) 50 MG tablet  Take 50 mg by mouth at bedtime. 05/05/16  Yes Historical Provider, MD  amphetamine-dextroamphetamine (ADDERALL) 20 MG tablet Take 1 tablet (20 mg total) by mouth 3 (three) times daily. 03/06/16 03/06/17  Myrlene Broker, MD  amphetamine-dextroamphetamine (ADDERALL) 20 MG tablet Take 1 tablet (20 mg total) by mouth 3 (three) times daily. 05/22/16 05/22/17  Myrlene Broker, MD    Family History Family History  Problem Relation Age of Onset  . ADD / ADHD Mother   . Alcohol abuse Maternal Uncle   . Depression Maternal Grandfather   . Alcohol abuse Maternal Grandfather     Social History Social  History  Substance Use Topics  . Smoking status: Current Every Day Smoker    Packs/day: 1.00  . Smokeless tobacco: Never Used  . Alcohol use Yes     Allergies   Codeine   Review of Systems Review of Systems  All other systems reviewed and are negative.    Physical Exam Updated Vital Signs BP 159/91 (BP Location: Right Wrist)   Pulse 78   Temp 98.9 F (37.2 C) (Oral)   Resp 16   Ht 5\' 3"  (1.6 m)   Wt 73.5 kg   LMP 05/25/2016   SpO2 99%   BMI 28.70 kg/m   Physical Exam  Constitutional: She is oriented to person, place, and time. She appears well-developed and well-nourished. No distress.  HENT:  Head: Normocephalic and atraumatic.  Right Ear: External ear normal.  Left Ear: External ear normal.  Nose: Nose normal.  Eyes: Conjunctivae and EOM are normal. Pupils are equal, round, and reactive to light.  Neck: Normal range of motion. Neck supple.  Cardiovascular: Normal rate, regular rhythm and normal heart sounds.   Pulmonary/Chest: Effort normal.  Mild diffuse rhonchi  Abdominal: Soft. Bowel sounds are normal. There is tenderness.    Musculoskeletal: Normal range of motion.  Neurological: She is alert and oriented to person, place, and time. She exhibits normal muscle tone. Coordination normal.  Skin: Skin is warm and dry.  Psychiatric: She has a normal mood and affect. Her behavior is normal. Thought content normal.  Nursing note and vitals reviewed.    ED Treatments / Results  Labs (all labs ordered are listed, but only abnormal results are displayed) Labs Reviewed  COMPREHENSIVE METABOLIC PANEL - Abnormal; Notable for the following:       Result Value   Potassium 2.3 (*)    Chloride 98 (*)    Glucose, Bld 101 (*)    Creatinine, Ser 1.50 (*)    Calcium 8.1 (*)    Albumin 2.8 (*)    AST 14 (*)    ALT 10 (*)    GFR calc non Af Amer 40 (*)    GFR calc Af Amer 47 (*)    All other components within normal limits  CBC - Abnormal; Notable for the  following:    WBC 15.3 (*)    All other components within normal limits  URINALYSIS, ROUTINE W REFLEX MICROSCOPIC - Abnormal; Notable for the following:    APPearance HAZY (*)    Hgb urine dipstick SMALL (*)    Protein, ur 100 (*)    Leukocytes, UA MODERATE (*)    Bacteria, UA FEW (*)    All other components within normal limits  LIPASE, BLOOD    EKG  EKG Interpretation None       Radiology Ct Abdomen Pelvis W Contrast  Result Date: 06/01/2016 CLINICAL DATA:  Left lower quadrant  pain for 3 days. Elevated white count. EXAM: CT ABDOMEN AND PELVIS WITH CONTRAST TECHNIQUE: Multidetector CT imaging of the abdomen and pelvis was performed using the standard protocol following bolus administration of intravenous contrast. CONTRAST:  80mL ISOVUE-300 IOPAMIDOL (ISOVUE-300) INJECTION 61% COMPARISON:  None. FINDINGS: Lower chest: No acute abnormality. Hepatobiliary: Stones are seen within the contracted gallbladder but there is no evidence of acute cholecystitis. No bile duct dilatation. Liver appears normal. Pancreas: Unremarkable. No pancreatic ductal dilatation or surrounding inflammatory changes. Spleen: Normal in size without focal abnormality. Adrenals/Urinary Tract: Ill-defined edema is seen throughout both kidneys, with associated perinephric edema/inflammation, indicating bilateral pyelonephritis. Low-density focus within the upper pole of the left renal cortex may indicate an early developing cortical abscess (series 7, image 17). There is associated mild renal pelviectasis bilaterally and periureteral inflammation bilaterally. Bladder is unremarkable. No ureteral or bladder calculi identified. There is a left adrenal mass measuring 3.1 x 2 cm. Right adrenal gland appears normal. Stomach/Bowel: Bowel is normal in caliber. No bowel wall thickening or evidence of bowel wall inflammation seen. Appendix is not convincingly seen but there are no inflammatory changes about the cecum to suggest acute  appendicitis. Per report, patient is status post appendectomy. Vascular/Lymphatic: Scattered atherosclerotic changes of the normal caliber abdominal aorta. No enlarged lymph nodes appreciated within the abdomen or pelvis. Reproductive: Uterus and bilateral adnexa are unremarkable. Other: No free intraperitoneal air. Musculoskeletal: No acute or suspicious osseous lesion. IMPRESSION: 1. Findings are consistent with acute bilateral pyelonephritis. Ill-defined edema is appreciated throughout both kidneys, best seen on delayed images. Low density focus within the upper pole of the left renal cortex is suspicious for early developing renal abscess. Recommend follow-up imaging after appropriate course of antibiotics to ensure resolution. 2. Left adrenal mass, measuring 3.1 x 2 cm. CT density measurements are not compatible with a benign lipid rich adrenal adenoma. Recommend nonemergent adrenal protocol MRI after current issues are resolved. 3. Cholelithiasis without evidence of acute cholecystitis. 4. Aortic atherosclerosis. Electronically Signed   By: Bary Richard M.D.   On: 06/01/2016 20:38    Procedures Procedures (including critical care time)  Medications Ordered in ED Medications  potassium chloride 10 mEq in 100 mL IVPB (10 mEq Intravenous New Bag/Given 06/01/16 1811)  iopamidol (ISOVUE-300) 61 % injection (not administered)  sodium chloride 0.9 % bolus 1,000 mL (1,000 mLs Intravenous New Bag/Given 06/01/16 1811)  potassium chloride 20 MEQ/15ML (10%) solution 40 mEq (40 mEq Oral Given 06/01/16 1828)  cefTRIAXone (ROCEPHIN) 1 g in dextrose 5 % 50 mL IVPB (0 g Intravenous Stopped 06/01/16 1850)     Initial Impression / Assessment and Plan / ED Course  I have reviewed the triage vital signs and the nursing notes.  Pertinent labs & imaging results that were available during my care of the patient were reviewed by me and considered in my medical decision making (see chart for details).     She'll  with hypokalemia here. By mouth oral repletion and IV repletion and sitting. Patient with urine consistent with urinary tract infection 1 g of Rocephin being given. Plan CT of abdomen for further evaluation.  1- pyelonephritis- iv rocephin given here 2- hypokalemia- repletion ensuing 3-left adrrenal mass-will need mri 4- increased creatinine- 1.5 last in system .6 in 2012  Discussed with Dr.David and temp admit orders for telemetry bed placed.  Final Clinical Impressions(s) / ED Diagnoses   Final diagnoses:  Pyelonephritis    New Prescriptions New Prescriptions   No medications on file  Margarita Grizzleanielle Reilley Latorre, MD 06/01/16 2103

## 2016-06-01 NOTE — ED Notes (Signed)
Pt transported to CT ?

## 2016-06-01 NOTE — H&P (Signed)
History and Physical    Jean Nelson:[id number] DOB: [birth year] DOA: 06/01/2016  PCP: [facility]  Patient coming from:  home  Chief Complaint:  Lower abdominal pain, bilateral flank pain  HPI: Jean Nelson is a 51 y.o. female with medical history significant of HTN has had abdominal and back pain for a week.  She went to see her PCP yesterday it was thought she had acute diverticulitis and she was started on flagyl and cipro.  Her pain has continued.  She reports it started off in both flank areas and now has moved to across the lower abdomen.  She denies any dysuria or hematuria.  No fevers or chills.  She has not had kidney infections before.  No n/v/d.  Labs were checked yesterday in the office and they were abnormal she was called and advised to come to the ED.  She has not been eating or drinking well for over 3 days.  She has been overall very weak.  Pt found to have significant hypokalemia and bilateral pyelonephritis and referred for admission for treatment of both.  Review of Systems: As per HPI otherwise 10 point review of systems negative.   Past Medical History:  Diagnosis Date  . Back pain   . Hypertension     Past Surgical History:  Procedure Laterality Date  . APPENDECTOMY    . TUBAL LIGATION       reports that she has been smoking.  She has been smoking about 1.00 pack per day. She has never used smokeless tobacco. She reports that she drinks alcohol. She reports that she does not use drugs.  Allergies  Allergen Reactions  . Codeine Hives    Family History  Problem Relation Age of Onset  . ADD / ADHD Mother   . Alcohol abuse Maternal Uncle   . Depression Maternal Grandfather   . Alcohol abuse Maternal Grandfather     Prior to Admission medications   Medication Sig Start Date End Date Taking? Authorizing Provider  albuterol (PROVENTIL HFA;VENTOLIN HFA) 108 (90 BASE) MCG/ACT inhaler Inhale 1-2 puffs into the lungs every 6 (six) hours as  needed for wheezing (Use the inhaler for the next 4 days 4 times a day then begin using only if needed for wheezing). 03/16/11 06/01/16 Yes Annamarie Dawley, MD  ALPRAZolam Prudy Feeler) 1 MG tablet Take 1 tablet (1 mg total) by mouth 3 (three) times daily. 05/22/16 05/22/17 Yes Myrlene Broker, MD  amLODipine (NORVASC) 5 MG tablet Take 5 mg by mouth daily.   Yes Historical Provider, MD  amphetamine-dextroamphetamine (ADDERALL) 20 MG tablet Take 1 tablet (20 mg total) by mouth 3 (three) times daily. 03/06/16  Yes Myrlene Broker, MD  buPROPion (WELLBUTRIN XL) 150 MG 24 hr tablet Take 1 tablet (150 mg total) by mouth every morning. 03/06/16 03/06/17 Yes Myrlene Broker, MD  busPIRone (BUSPAR) 10 MG tablet Take 1 tablet (10 mg total) by mouth 3 (three) times daily. 03/06/16  Yes Myrlene Broker, MD  ciprofloxacin (CIPRO) 500 MG tablet Take 500 mg by mouth 2 (two) times daily. 10 day course starting on 05/31/2016 05/31/16  Yes Historical Provider, MD  cyclobenzaprine (FLEXERIL) 10 MG tablet Take 1 tablet by mouth 3 (three) times daily as needed for muscle spasms.  05/26/16  Yes Historical Provider, MD  HYDROcodone-acetaminophen (NORCO/VICODIN) 5-325 MG tablet Take 1 tablet by mouth 3 (three) times daily. 12/21/15  Yes Historical Provider, MD  Ketorolac Tromethamine (TORADOL IJ) Inject as  directed once.   Yes Historical Provider, MD  lisinopril (PRINIVIL,ZESTRIL) 20 MG tablet Take 30 mg by mouth at bedtime.    Yes Historical Provider, MD  meloxicam (MOBIC) 15 MG tablet Take 15 mg by mouth daily. 05/31/16  Yes Historical Provider, MD  metoprolol tartrate (LOPRESSOR) 25 MG tablet Take 25 mg by mouth 2 (two) times daily.   Yes Historical Provider, MD  metroNIDAZOLE (FLAGYL) 500 MG tablet Take 500 mg by mouth 3 (three) times daily. 10 day course starting on 05/31/2016 05/31/16  Yes Historical Provider, MD  potassium chloride (K-DUR) 10 MEQ tablet Take 10 mEq by mouth daily.   Yes Historical Provider, MD  traZODone (DESYREL) 50 MG  tablet Take 50 mg by mouth at bedtime. 05/05/16  Yes Historical Provider, MD  amphetamine-dextroamphetamine (ADDERALL) 20 MG tablet Take 1 tablet (20 mg total) by mouth 3 (three) times daily. 03/06/16 03/06/17  Myrlene Broker, MD  amphetamine-dextroamphetamine (ADDERALL) 20 MG tablet Take 1 tablet (20 mg total) by mouth 3 (three) times daily. 05/22/16 05/22/17  Myrlene Broker, MD    Physical Exam: Vitals:   06/01/16 1843 06/01/16 1910 06/01/16 2030 06/01/16 2130  BP: 159/91 103/82 146/80 148/78  Pulse: 78 91 80 75  Resp: 16 17 17 18   Temp: 98.9 F (37.2 C)     TempSrc: Oral     SpO2: 99% 98% 97% 96%  Weight:      Height:        Constitutional: NAD, calm, comfortable Vitals:   06/01/16 1843 06/01/16 1910 06/01/16 2030 06/01/16 2130  BP: 159/91 103/82 146/80 148/78  Pulse: 78 91 80 75  Resp: 16 17 17 18   Temp: 98.9 F (37.2 C)     TempSrc: Oral     SpO2: 99% 98% 97% 96%  Weight:      Height:       Eyes: PERRL, lids and conjunctivae normal ENMT: Mucous membranes are moist. Posterior pharynx clear of any exudate or lesions.Normal dentition.  Neck: normal, supple, no masses, no thyromegaly Respiratory: clear to auscultation bilaterally, no wheezing, no crackles. Normal respiratory effort. No accessory muscle use.  Cardiovascular: Regular rate and rhythm, no murmurs / rubs / gallops. No extremity edema. 2+ pedal pulses. No carotid bruits.  Abdomen:  Lower abd tenderness, no masses palpated. No hepatosplenomegaly. Bowel sounds positive.  Musculoskeletal: no clubbing / cyanosis. No joint deformity upper and lower extremities. Good ROM, no contractures. Normal muscle tone.  Skin: no rashes, lesions, ulcers. No induration Neurologic: CN 2-12 grossly intact. Sensation intact, DTR normal. Strength 5/5 in all 4.  Psychiatric: Normal judgment and insight. Alert and oriented x 3. Normal mood.    Labs on Admission: I have personally reviewed following labs and imaging studies  CBC:  Recent  Labs Lab 06/01/16 1406  WBC 15.3*  HGB 14.0  HCT 40.9  MCV 94.9  PLT 253   Basic Metabolic Panel:  Recent Labs Lab 06/01/16 1406  NA 136  K 2.3*  CL 98*  CO2 26  GLUCOSE 101*  BUN 19  CREATININE 1.50*  CALCIUM 8.1*  MG 1.8   GFR: Estimated Creatinine Clearance: 44 mL/min (by C-G formula based on SCr of 1.5 mg/dL (H)). Liver Function Tests:  Recent Labs Lab 06/01/16 1406  AST 14*  ALT 10*  ALKPHOS 82  BILITOT 0.4  PROT 6.8  ALBUMIN 2.8*    Recent Labs Lab 06/01/16 1406  LIPASE 11    Urine analysis:    Component Value Date/Time  COLORURINE YELLOW 06/01/2016 1227   APPEARANCEUR HAZY (A) 06/01/2016 1227   LABSPEC 1.016 06/01/2016 1227   PHURINE 5.0 06/01/2016 1227   GLUCOSEU NEGATIVE 06/01/2016 1227   HGBUR SMALL (A) 06/01/2016 1227   BILIRUBINUR NEGATIVE 06/01/2016 1227   KETONESUR NEGATIVE 06/01/2016 1227   PROTEINUR 100 (A) 06/01/2016 1227   NITRITE NEGATIVE 06/01/2016 1227   LEUKOCYTESUR MODERATE (A) 06/01/2016 1227   Radiological Exams on Admission: Ct Abdomen Pelvis W Contrast  Result Date: 06/01/2016 CLINICAL DATA:  Left lower quadrant pain for 3 days. Elevated white count. EXAM: CT ABDOMEN AND PELVIS WITH CONTRAST TECHNIQUE: Multidetector CT imaging of the abdomen and pelvis was performed using the standard protocol following bolus administration of intravenous contrast. CONTRAST:  80mL ISOVUE-300 IOPAMIDOL (ISOVUE-300) INJECTION 61% COMPARISON:  None. FINDINGS: Lower chest: No acute abnormality. Hepatobiliary: Stones are seen within the contracted gallbladder but there is no evidence of acute cholecystitis. No bile duct dilatation. Liver appears normal. Pancreas: Unremarkable. No pancreatic ductal dilatation or surrounding inflammatory changes. Spleen: Normal in size without focal abnormality. Adrenals/Urinary Tract: Ill-defined edema is seen throughout both kidneys, with associated perinephric edema/inflammation, indicating bilateral  pyelonephritis. Low-density focus within the upper pole of the left renal cortex may indicate an early developing cortical abscess (series 7, image 17). There is associated mild renal pelviectasis bilaterally and periureteral inflammation bilaterally. Bladder is unremarkable. No ureteral or bladder calculi identified. There is a left adrenal mass measuring 3.1 x 2 cm. Right adrenal gland appears normal. Stomach/Bowel: Bowel is normal in caliber. No bowel wall thickening or evidence of bowel wall inflammation seen. Appendix is not convincingly seen but there are no inflammatory changes about the cecum to suggest acute appendicitis. Per report, patient is status post appendectomy. Vascular/Lymphatic: Scattered atherosclerotic changes of the normal caliber abdominal aorta. No enlarged lymph nodes appreciated within the abdomen or pelvis. Reproductive: Uterus and bilateral adnexa are unremarkable. Other: No free intraperitoneal air. Musculoskeletal: No acute or suspicious osseous lesion. IMPRESSION: 1. Findings are consistent with acute bilateral pyelonephritis. Ill-defined edema is appreciated throughout both kidneys, best seen on delayed images. Low density focus within the upper pole of the left renal cortex is suspicious for early developing renal abscess. Recommend follow-up imaging after appropriate course of antibiotics to ensure resolution. 2. Left adrenal mass, measuring 3.1 x 2 cm. CT density measurements are not compatible with a benign lipid rich adrenal adenoma. Recommend nonemergent adrenal protocol MRI after current issues are resolved. 3. Cholelithiasis without evidence of acute cholecystitis. 4. Aortic atherosclerosis. Electronically Signed   By: Bary Richard M.D.   On: 06/01/2016 20:38    EKG:  Has not been done yet in ED, pending   Assessment/Plan 51 yo female with a week of abdominal pain and not eating well found to have bilateral pyelonephritis and hypokalemia  Principal Problem:    Pyelonephritis- bilateral without any hydronephrosis but with possible evidence of early absess forming.  Place on iv rocephin, if clinically does not improve would repeat imaging in 48 hours or so to reassess kidney.  Urine cx sent.    Active Problems:   Generalized anxiety disorder- stable at this time   Hypokalemia- replete, repeat level around 1100pm tonight and in the am.  Check mag level   AKI (acute kidney injury) (HCC)- mild, should improve with ivf, cr 1.5   Adrenal mass- outpt follow up    DVT prophylaxis:  scds  Code Status:  full Family Communication: none  Disposition Plan:  Per day team Consults called:  none Admission status:  admission   Jaquail Mclees A MD [facility]  If 7PM-7AM, please contact night-coverage [email or web address] Password [email or web address]  06/01/2016, 10:04 PM

## 2016-06-01 NOTE — ED Notes (Signed)
Pt given meal tray- ate 100%.

## 2016-06-02 ENCOUNTER — Encounter (HOSPITAL_COMMUNITY): Payer: Self-pay

## 2016-06-02 LAB — BASIC METABOLIC PANEL
ANION GAP: 9 (ref 5–15)
Anion gap: 10 (ref 5–15)
BUN: 15 mg/dL (ref 6–20)
BUN: 17 mg/dL (ref 6–20)
CALCIUM: 7.7 mg/dL — AB (ref 8.9–10.3)
CALCIUM: 7.9 mg/dL — AB (ref 8.9–10.3)
CO2: 23 mmol/L (ref 22–32)
CO2: 25 mmol/L (ref 22–32)
Chloride: 104 mmol/L (ref 101–111)
Chloride: 106 mmol/L (ref 101–111)
Creatinine, Ser: 1.17 mg/dL — ABNORMAL HIGH (ref 0.44–1.00)
Creatinine, Ser: 1.27 mg/dL — ABNORMAL HIGH (ref 0.44–1.00)
GFR calc Af Amer: 57 mL/min — ABNORMAL LOW (ref >60)
GFR calc Af Amer: >60 mL/min (ref >60)
GFR calc non Af Amer: 54 mL/min — ABNORMAL LOW (ref >60)
GFR, EST NON AFRICAN AMERICAN: 49 mL/min — AB (ref >60)
GLUCOSE: 128 mg/dL — AB (ref 65–99)
GLUCOSE: 93 mg/dL (ref 65–99)
Potassium: 2.7 mmol/L — CL (ref 3.5–5.1)
Potassium: 3 mmol/L — ABNORMAL LOW (ref 3.5–5.1)
Sodium: 138 mmol/L (ref 135–145)
Sodium: 139 mmol/L (ref 135–145)

## 2016-06-02 LAB — CBC
HCT: 36.9 % (ref 36.0–46.0)
HEMOGLOBIN: 12.3 g/dL (ref 12.0–15.0)
MCH: 31.8 pg (ref 26.0–34.0)
MCHC: 33.3 g/dL (ref 30.0–36.0)
MCV: 95.3 fL (ref 78.0–100.0)
Platelets: 227 10*3/uL (ref 150–400)
RBC: 3.87 MIL/uL (ref 3.87–5.11)
RDW: 15.4 % (ref 11.5–15.5)
WBC: 9.7 10*3/uL (ref 4.0–10.5)

## 2016-06-02 LAB — MAGNESIUM: Magnesium: 1.6 mg/dL — ABNORMAL LOW (ref 1.7–2.4)

## 2016-06-02 MED ORDER — POTASSIUM CHLORIDE IN NACL 40-0.9 MEQ/L-% IV SOLN
INTRAVENOUS | Status: DC
  Administered 2016-06-02 (×2): 100 mL/h via INTRAVENOUS
  Filled 2016-06-02 (×2): qty 1000

## 2016-06-02 MED ORDER — POTASSIUM CHLORIDE CRYS ER 20 MEQ PO TBCR
40.0000 meq | EXTENDED_RELEASE_TABLET | Freq: Once | ORAL | Status: AC
  Administered 2016-06-02: 40 meq via ORAL
  Filled 2016-06-02: qty 2

## 2016-06-02 MED ORDER — OXYCODONE HCL 5 MG PO TABS
5.0000 mg | ORAL_TABLET | ORAL | Status: DC | PRN
  Administered 2016-06-02 – 2016-06-03 (×7): 5 mg via ORAL
  Filled 2016-06-02 (×7): qty 1

## 2016-06-02 MED ORDER — BUSPIRONE HCL 5 MG PO TABS
10.0000 mg | ORAL_TABLET | Freq: Three times a day (TID) | ORAL | Status: DC
  Administered 2016-06-02 – 2016-06-03 (×4): 10 mg via ORAL
  Filled 2016-06-02 (×4): qty 2
  Filled 2016-06-02 (×2): qty 1
  Filled 2016-06-02 (×2): qty 2
  Filled 2016-06-02: qty 1
  Filled 2016-06-02 (×4): qty 2
  Filled 2016-06-02: qty 1

## 2016-06-02 MED ORDER — BUPROPION HCL ER (XL) 150 MG PO TB24
150.0000 mg | ORAL_TABLET | ORAL | Status: DC
  Administered 2016-06-02 – 2016-06-03 (×2): 150 mg via ORAL
  Filled 2016-06-02 (×2): qty 1

## 2016-06-02 MED ORDER — MAGNESIUM SULFATE 2 GM/50ML IV SOLN
2.0000 g | Freq: Once | INTRAVENOUS | Status: AC
  Administered 2016-06-02: 2 g via INTRAVENOUS
  Filled 2016-06-02: qty 50

## 2016-06-02 MED ORDER — ALBUTEROL SULFATE HFA 108 (90 BASE) MCG/ACT IN AERS
1.0000 | INHALATION_SPRAY | Freq: Four times a day (QID) | RESPIRATORY_TRACT | Status: DC | PRN
  Filled 2016-06-02: qty 6.7

## 2016-06-02 MED ORDER — DEXTROSE 5 % IV SOLN
2.0000 g | INTRAVENOUS | Status: DC
  Administered 2016-06-02: 2 g via INTRAVENOUS
  Filled 2016-06-02 (×2): qty 2

## 2016-06-02 MED ORDER — ALPRAZOLAM 1 MG PO TABS
1.0000 mg | ORAL_TABLET | Freq: Three times a day (TID) | ORAL | Status: DC
  Administered 2016-06-02 – 2016-06-03 (×2): 1 mg via ORAL
  Filled 2016-06-02 (×4): qty 1

## 2016-06-02 MED ORDER — ONDANSETRON HCL 4 MG PO TABS: 4.0000 mg | ORAL_TABLET | Freq: Four times a day (QID) | ORAL | Status: DC | PRN

## 2016-06-02 MED ORDER — ALBUTEROL SULFATE (2.5 MG/3ML) 0.083% IN NEBU: 3.0000 mL | INHALATION_SOLUTION | Freq: Four times a day (QID) | RESPIRATORY_TRACT | Status: DC | PRN

## 2016-06-02 MED ORDER — AMLODIPINE BESYLATE 5 MG PO TABS
5.0000 mg | ORAL_TABLET | Freq: Every day | ORAL | Status: DC
  Administered 2016-06-02 – 2016-06-03 (×2): 5 mg via ORAL
  Filled 2016-06-02 (×2): qty 1

## 2016-06-02 MED ORDER — POTASSIUM CHLORIDE 10 MEQ/100ML IV SOLN
10.0000 meq | INTRAVENOUS | Status: AC
  Administered 2016-06-02 (×3): 10 meq via INTRAVENOUS
  Filled 2016-06-02 (×3): qty 100

## 2016-06-02 MED ORDER — METOPROLOL TARTRATE 25 MG PO TABS
25.0000 mg | ORAL_TABLET | Freq: Two times a day (BID) | ORAL | Status: DC
  Administered 2016-06-02 – 2016-06-03 (×4): 25 mg via ORAL
  Filled 2016-06-02 (×4): qty 1

## 2016-06-02 MED ORDER — ONDANSETRON HCL 4 MG/2ML IJ SOLN: 4.0000 mg | Freq: Four times a day (QID) | INTRAMUSCULAR | Status: DC | PRN

## 2016-06-02 MED ORDER — POTASSIUM CHLORIDE IN NACL 40-0.9 MEQ/L-% IV SOLN
INTRAVENOUS | Status: AC
  Filled 2016-06-02: qty 1000

## 2016-06-02 MED ORDER — DEXTROSE 5 % IV SOLN: 2.0000 g | INTRAVENOUS | Status: DC

## 2016-06-02 NOTE — Progress Notes (Signed)
  PROGRESS NOTE  Jean LericheCandy V Nelson RUE:[id number]:[id number] DOB: 01/10/1966 DOA: 06/01/2016 [facility]  Brief Narrative: 51 year old woman presented with abdominal and back pain, started on Flagyl and Cipro for possible diverticulitis by PCP. Evaluation in the emergency department admitted pyelonephritis with possible developing abscess.  Assessment/Plan 1. Acute bilateral pyelonephritis with possible developing left renal abscess. 2. Hypokalemia, improved with repletion. 3. Acute kidney injury. Resolving with IV fluids. 4. Left adrenal mass. Recommend MRI after acute illness resolved. 5. Aortic atherosclerosis.   Subjectively much improved. Hemodynamics stable. Continue empiric antibiotics, follow-up culture data. Consider repeat imaging in the near future to reassess possible abscess.  Replace potassium.  DVT prophylaxis: SCDs Code Status: full code Family Communication: none Disposition Plan: home   Brendia Sacksaniel Khristian Seals, MD  [facility] Direct contact: [phone] --Via amion app OR  --[email or web address]; password [email or web address]  7PM-7AM contact night coverage as above 06/02/2016, 12:23 PM  LOS: 1 day   Consultants:  None   Procedures:    Antimicrobials:  Ceftriaxone 1/29 >>  Interval history/Subjective: Feels better today. Painless. No nausea or vomiting.  Objective: Vitals:   06/02/16 0030 06/02/16 0100 06/02/16 0300 06/02/16 0522  BP: 149/85 153/92 (!) 163/75 (!) 154/77  Pulse: 86 78 78 66  Resp: 20 20 (!) 21 20  Temp:   98.5 F (36.9 C) 99 F (37.2 C)  TempSrc:   Oral Oral  SpO2: 96% 98% 97% 98%  Weight:   74.6 kg (164 lb 8 oz)   Height:   5\' 3"  (1.6 m)     Intake/Output Summary (Last 24 hours) at 06/02/16 1223 Last data filed at 06/02/16 0345  Gross per 24 hour  Intake          2526.67 ml  Output                0 ml  Net          2526.67 ml     Filed Weights   06/01/16 1225 06/02/16 0300  Weight: 73.5 kg (162 lb) 74.6 kg (164 lb 8 oz)     Exam:    Constitutional: Appears calm, comfortable. Cardiovascular. Regular rate and rhythm. No murmur, rub or gallop. No lower extremity edema. Telemetry sinus rhythm. Respiratory clear to auscultation bilaterally. No wheezes, rales or rhonchi. Normal respiratory effort. Abdomen soft, nontender, nondistended. Moderate left flank pain with palpation.   I have personally reviewed following labs and imaging studies:  Potassium 3.0. Creatinine trending down, 1.17.  CBC unremarkable. Leukocytosis has resolved.  Scheduled Meds: . ALPRAZolam  1 mg Oral TID  . amLODipine  5 mg Oral Daily  . buPROPion  150 mg Oral [id number]  . busPIRone  10 mg Oral TID  . cefTRIAXone (ROCEPHIN)  IV  2 g Intravenous Q24H  . metoprolol tartrate  25 mg Oral BID   Continuous Infusions: . 0.9 % NaCl with KCl 40 mEq / L 100 mL/hr (06/02/16 0059)    Principal Problem:   Pyelonephritis Active Problems:   Generalized anxiety disorder   Hypokalemia   AKI (acute kidney injury) (HCC)   LOS: 1 day

## 2016-06-02 NOTE — ED Notes (Signed)
CRITICAL VALUE ALERT  Critical value received:  K+- 2.7  Date of notification:  06/02/16  Time of notification:  0046  Critical value read back:Yes.    Nurse who received alert:  Marisa Hua Bryli Mantey, RN  MD notified (1st page):  Dr Onalee Huaavid  Time of first page:  0047  MD notified (2nd page):  Time of second page:  Responding MD:  Dr Onalee Huaavid  Time MD responded:  [phone]

## 2016-06-02 NOTE — ED Notes (Signed)
Olegario MessierKathy, [facility] notified [facility] need for 0.9 % NS with 40 mEq of K+

## 2016-06-03 ENCOUNTER — Telehealth (HOSPITAL_COMMUNITY): Payer: Self-pay | Admitting: *Deleted

## 2016-06-03 ENCOUNTER — Ambulatory Visit (HOSPITAL_COMMUNITY): Payer: Self-pay | Admitting: Psychiatry

## 2016-06-03 ENCOUNTER — Encounter (HOSPITAL_COMMUNITY): Payer: Self-pay

## 2016-06-03 LAB — BASIC METABOLIC PANEL
Anion gap: 10 (ref 5–15)
BUN: 10 mg/dL (ref 6–20)
CALCIUM: 8.1 mg/dL — AB (ref 8.9–10.3)
CO2: 24 mmol/L (ref 22–32)
CREATININE: 0.97 mg/dL (ref 0.44–1.00)
Chloride: 105 mmol/L (ref 101–111)
GFR calc Af Amer: >60 mL/min (ref >60)
GLUCOSE: 114 mg/dL — AB (ref 65–99)
Potassium: 3.7 mmol/L (ref 3.5–5.1)
Sodium: 139 mmol/L (ref 135–145)

## 2016-06-03 MED ORDER — HYDRALAZINE HCL 20 MG/ML IJ SOLN
10.0000 mg | INTRAMUSCULAR | Status: DC | PRN
Start: 2016-06-03 — End: 2016-06-03

## 2016-06-03 MED ORDER — AMLODIPINE BESYLATE 10 MG PO TABS: 10.0000 mg | ORAL_TABLET | Freq: Every day | ORAL | 0 refills | Status: DC

## 2016-06-03 MED ORDER — CEFPODOXIME PROXETIL 200 MG PO TABS: 200.0000 mg | ORAL_TABLET | Freq: Two times a day (BID) | ORAL | 0 refills | Status: AC

## 2016-06-03 MED ORDER — BLISTEX MEDICATED EX OINT
TOPICAL_OINTMENT | CUTANEOUS | Status: DC | PRN
  Filled 2016-06-03: qty 6.3

## 2016-06-03 MED ORDER — LISINOPRIL 40 MG PO TABS: 40.0000 mg | ORAL_TABLET | Freq: Every day | ORAL | 0 refills | Status: DC

## 2016-06-03 MED ORDER — METOPROLOL TARTRATE 37.5 MG PO TABS: 37.5000 mg | ORAL_TABLET | Freq: Two times a day (BID) | ORAL | 0 refills | Status: DC

## 2016-06-03 NOTE — Progress Notes (Signed)
Patient discharged with instructions, prescription, and care notes.  Verbalized understanding via teach back.  IV was removed and the site was WNL. Patient voiced no further complaints or concerns at the time of discharge.  Appointments scheduled per instructions.  Patient left the floor via w/c family  And staff in stable condition. 

## 2016-06-03 NOTE — Discharge Instructions (Signed)
Pyelonephritis, Adult °Introduction °Pyelonephritis is a kidney infection. The kidneys are organs that help clean your blood by moving waste out of your blood and into your pee (urine). This infection can happen quickly, or it can last for a long time. In most cases, it clears up with treatment and does not cause other problems. °Follow these instructions at home: °Medicines °· Take over-the-counter and prescription medicines only as told by your doctor. °· Take your antibiotic medicine as told by your doctor. Do not stop taking the medicine even if you start to feel better. °General instructions °· Drink enough fluid to keep your pee clear or pale yellow. °· Avoid caffeine, tea, and carbonated drinks. °· Pee (urinate) often. Avoid holding in pee for long periods of time. °· Pee before and after sex. °· After pooping (having a bowel movement), women should wipe from front to back. Use each tissue only once. °· Keep all follow-up visits as told by your doctor. This is important. °Contact a doctor if: °· You do not feel better after 2 days. °· Your symptoms get worse. °· You have a fever. °Get help right away if: °· You cannot take your medicine or drink fluids as told. °· You have chills and shaking. °· You throw up (vomit). °· You have very bad pain in your side (flank) or back. °· You feel very weak or you pass out (faint). °This information is not intended to replace advice given to you by your health care provider. Make sure you discuss any questions you have with your health care provider. °Document Released: 05/28/2004 Document Revised: 09/26/2015 Document Reviewed: 08/13/2014 °© 2017 [employer] ° °

## 2016-06-03 NOTE — Telephone Encounter (Signed)
PT CALLED AT 1:38PM ON 06-03-2016 STATING [facility] AND JUST REALIZED SHE HAD AN APPT TODAY 06-03-2016 AND WANTS TO RESCH APPT. PER PT SHE WILL NOT RUN OUT OF MEDICATIONS BEFORE NEXT APPT.../OR

## 2016-06-03 NOTE — Discharge Summary (Signed)
Physician Discharge Summary  [name] ZOX:[id number] DOB: [birth year] DOA: 06/01/2016  PCP: [facility]  Dr. Wynelle Link is her PCP  Admit date: 06/01/2016 Discharge date: 06/03/2016  Admitted From: Home  Disposition:  Home   Recommendations for Outpatient Follow-up:  1. Follow up with PCP in 2 days as scheduled 2. Please get follow up adrenal protocol MRI to evaluate left adrenal mass when completed treatment for infection 3. Please obtain BMP/CBC in one week.  Please follow up on final culture results.  4. Please adjust blood pressure meds as needed for better blood pressure control  Discharge Condition: STABLE  CODE STATUS: FULL  Diet recommendation: Heart Healthy   Brief/Interim Summary: HPI: Jean Nelson is a 51 y.o. female with medical history significant of HTN has had abdominal and back pain for a week.  She went to see her PCP yesterday it was thought she had acute diverticulitis and she was started on flagyl and cipro.  Her pain has continued.  She reports it started off in both flank areas and now has moved to across the lower abdomen.  She denies any dysuria or hematuria.  No fevers or chills.  She has not had kidney infections before.  No n/v/d.  Labs were checked yesterday in the office and they were abnormal she was called and advised to come to the ED.  She has not been eating or drinking well for over 3 days.  She has been overall very weak.  Pt found to have significant hypokalemia and bilateral pyelonephritis and referred for admission for treatment of both.  Assessment/Plan 1. Acute bilateral pyelonephritis with possible developing left renal abscess - Pt was treated with IV ceftriaxone and rapidly improved and now asking to go home.  Cultures pending at time of discharge.  Pt discharged home on cefpodoxime x 12 more days.  2. Hypokalemia - repleted. 3. Acute kidney injury. Resolved with IV fluids. 4. Left adrenal mass. Recommend adrenal protocol MRI after acute  illness resolved. Pt to ask PCP to get study done. Pt has appt in 2 days.  5. Aortic atherosclerosis. 6. Essential hypertension - suboptimally controlled, increased amlodipine to 10 mg daily, lisinopril increased to 40 mg and metoprolol increased to 37.5 mg BID, close outpatient follow up with PCP already arranged in 2 days.   7. Leukocytosis - resolved.    Subjectively much improved. Hemodynamics stable. Continue empiric antibiotics, follow-up culture data. Consider repeat imaging in the near future to reassess possible abscess.  Replaced potassium.  DVT prophylaxis: SCDs Code Status: full code Family Communication: none Disposition Plan: home   Discharge Diagnoses:  Principal Problem:   Pyelonephritis Active Problems:   Generalized anxiety disorder   Hypokalemia   AKI (acute kidney injury) [facility])  Discharge Instructions  Discharge Instructions    Diet - low sodium heart healthy    Complete by:  As directed    Increase activity slowly    Complete by:  As directed      Allergies as of 06/03/2016      Reactions   Codeine Hives      Medication List    STOP taking these medications   ciprofloxacin 500 MG tablet Commonly known as:  CIPRO   meloxicam 15 MG tablet Commonly known as:  MOBIC   metroNIDAZOLE 500 MG tablet Commonly known as:  FLAGYL   TORADOL IJ     TAKE these medications   albuterol 108 (90 Base) MCG/ACT inhaler Commonly known as:  PROVENTIL HFA;VENTOLIN  HFA Inhale 1-2 puffs into the lungs every 6 (six) hours as needed for wheezing (Use the inhaler for the next 4 days 4 times a day then begin using only if needed for wheezing).   ALPRAZolam 1 MG tablet Commonly known as:  XANAX Take 1 tablet (1 mg total) by mouth 3 (three) times daily.   amLODipine 10 MG tablet Commonly known as:  NORVASC Take 1 tablet (10 mg total) by mouth daily. What changed:  medication strength  how much to take   amphetamine-dextroamphetamine 20 MG tablet Commonly  known as:  ADDERALL Take 1 tablet (20 mg total) by mouth 3 (three) times daily. What changed:  Another medication with the same name was removed. Continue taking this medication, and follow the directions you see here.   buPROPion 150 MG 24 hr tablet Commonly known as:  WELLBUTRIN XL Take 1 tablet (150 mg total) by mouth every morning.   busPIRone 10 MG tablet Commonly known as:  BUSPAR Take 1 tablet (10 mg total) by mouth 3 (three) times daily.   cefpodoxime 200 MG tablet Commonly known as:  VANTIN Take 1 tablet (200 mg total) by mouth 2 (two) times daily.   cyclobenzaprine 10 MG tablet Commonly known as:  FLEXERIL Take 1 tablet by mouth 3 (three) times daily as needed for muscle spasms.   HYDROcodone-acetaminophen 5-325 MG tablet Commonly known as:  NORCO/VICODIN Take 1 tablet by mouth 3 (three) times daily.   lisinopril 40 MG tablet Commonly known as:  PRINIVIL,ZESTRIL Take 1 tablet (40 mg total) by mouth at bedtime. What changed:  medication strength  how much to take   Metoprolol Tartrate 37.5 MG Tabs Take 37.5 mg by mouth 2 (two) times daily. What changed:  medication strength  how much to take   potassium chloride 10 MEQ tablet Commonly known as:  K-DUR Take 10 mEq by mouth daily.   traZODone 50 MG tablet Commonly known as:  DESYREL Take 50 mg by mouth at bedtime.      Follow-up Information    [facility] Follow up on 06/05/2016.   Why:  As already scheduled with Dr. Elmer Sow information: [phone] [address] [place] [address] [phone]          Allergies  Allergen Reactions  . Codeine Hives    Procedures/Studies: Ct Abdomen Pelvis W Contrast  Result Date: 06/01/2016 CLINICAL DATA:  Left lower quadrant pain for 3 days. Elevated white count. EXAM: CT ABDOMEN AND PELVIS WITH CONTRAST TECHNIQUE: Multidetector CT imaging of the abdomen and pelvis was performed using the standard protocol following bolus administration of  intravenous contrast. CONTRAST:  80mL ISOVUE-300 IOPAMIDOL (ISOVUE-300) INJECTION 61% COMPARISON:  None. FINDINGS: Lower chest: No acute abnormality. Hepatobiliary: Stones are seen within the contracted gallbladder but there is no evidence of acute cholecystitis. No bile duct dilatation. Liver appears normal. Pancreas: Unremarkable. No pancreatic ductal dilatation or surrounding inflammatory changes. Spleen: Normal in size without focal abnormality. Adrenals/Urinary Tract: Ill-defined edema is seen throughout both kidneys, with associated perinephric edema/inflammation, indicating bilateral pyelonephritis. Low-density focus within the upper pole of the left renal cortex may indicate an early developing cortical abscess (series 7, image 17). There is associated mild renal pelviectasis bilaterally and periureteral inflammation bilaterally. Bladder is unremarkable. No ureteral or bladder calculi identified. There is a left adrenal mass measuring 3.1 x 2 cm. Right adrenal gland appears normal. Stomach/Bowel: Bowel is normal in caliber. No bowel wall thickening or evidence of bowel wall inflammation seen. Appendix  is not convincingly seen but there are no inflammatory changes about the cecum to suggest acute appendicitis. Per report, patient is status post appendectomy. Vascular/Lymphatic: Scattered atherosclerotic changes of the normal caliber abdominal aorta. No enlarged lymph nodes appreciated within the abdomen or pelvis. Reproductive: Uterus and bilateral adnexa are unremarkable. Other: No free intraperitoneal air. Musculoskeletal: No acute or suspicious osseous lesion. IMPRESSION: 1. Findings are consistent with acute bilateral pyelonephritis. Ill-defined edema is appreciated throughout both kidneys, best seen on delayed images. Low density focus within the upper pole of the left renal cortex is suspicious for early developing renal abscess. Recommend follow-up imaging after appropriate course of antibiotics to  ensure resolution. 2. Left adrenal mass, measuring 3.1 x 2 cm. CT density measurements are not compatible with a benign lipid rich adrenal adenoma. Recommend nonemergent adrenal protocol MRI after current issues are resolved. 3. Cholelithiasis without evidence of acute cholecystitis. 4. Aortic atherosclerosis. Electronically Signed   By: Bary RichardStan  Maynard M.D.   On: 06/01/2016 20:38   Subjective: Pt reports that she feels much better.  Pt wants to go home.  Pt has follow up in 2 days with PCP.   Discharge Exam: Vitals:   06/02/16 2024 06/03/16 0545  BP: (!) 165/91 (!) 173/97  Pulse: 70 71  Resp: 18 19  Temp: 98.8 F (37.1 C) 99.3 F (37.4 C)   Vitals:   06/02/16 0300 06/02/16 0522 06/02/16 2024 06/03/16 0545  BP: (!) 163/75 (!) 154/77 (!) 165/91 (!) 173/97  Pulse: 78 66 70 71  Resp: (!) 21 20 18 19   Temp: 98.5 F (36.9 C) 99 F (37.2 C) 98.8 F (37.1 C) 99.3 F (37.4 C)  TempSrc: Oral Oral Oral Oral  SpO2: 97% 98% 100% 99%  Weight: 74.6 kg (164 lb 8 oz)   75.1 kg (165 lb 9.6 oz)  Height: 5\' 3"  (1.6 m)       General: Pt is alert, awake, not in acute distress Cardiovascular: RRR, S1/S2 +, no rubs, no gallops Respiratory: CTA bilaterally, no wheezing, no rhonchi Abdominal: Soft, NT, ND, bowel sounds + Extremities: no edema, no cyanosis  The results of significant diagnostics from this hospitalization (including imaging, microbiology, ancillary and laboratory) are listed below for reference.     Microbiology: No results found for this or any previous visit (from the past 240 hour(s)).   Labs: BNP (last 3 results) No results for input(s): BNP in the last 8760 hours. Basic Metabolic Panel:  Recent Labs Lab 06/01/16 1406 06/02/16 0014 06/02/16 0426 06/03/16 0448  NA 136 139 138 139  K 2.3* 2.7* 3.0* 3.7  CL 98* 106 104 105  CO2 26 23 25 24   GLUCOSE 101* 128* 93 114*  BUN 19 17 15 10   CREATININE 1.50* 1.27* 1.17* 0.97  CALCIUM 8.1* 7.7* 7.9* 8.1*  MG 1.8 1.6*  --    --    Liver Function Tests:  Recent Labs Lab 06/01/16 1406  AST 14*  ALT 10*  ALKPHOS 82  BILITOT 0.4  PROT 6.8  ALBUMIN 2.8*    Recent Labs Lab 06/01/16 1406  LIPASE 11   No results for input(s): AMMONIA in the last 168 hours. CBC:  Recent Labs Lab 06/01/16 1406 06/02/16 0426  WBC 15.3* 9.7  HGB 14.0 12.3  HCT 40.9 36.9  MCV 94.9 95.3  PLT 253 227   Cardiac Enzymes: No results for input(s): CKTOTAL, CKMB, CKMBINDEX, TROPONINI in the last 168 hours. BNP: Invalid input(s): POCBNP CBG: No results for input(s): GLUCAP  in the last 168 hours. D-Dimer No results for input(s): DDIMER in the last 72 hours. Hgb A1c No results for input(s): HGBA1C in the last 72 hours. Lipid Profile No results for input(s): CHOL, HDL, LDLCALC, TRIG, CHOLHDL, LDLDIRECT in the last 72 hours. Thyroid function studies No results for input(s): TSH, T4TOTAL, T3FREE, THYROIDAB in the last 72 hours.  Invalid input(s): FREET3 Anemia work up No results for input(s): VITAMINB12, FOLATE, FERRITIN, TIBC, IRON, RETICCTPCT in the last 72 hours. Urinalysis    Component Value Date/Time   COLORURINE YELLOW 06/01/2016 1227   APPEARANCEUR HAZY (A) 06/01/2016 1227   LABSPEC 1.016 06/01/2016 1227   PHURINE 5.0 06/01/2016 1227   GLUCOSEU NEGATIVE 06/01/2016 1227   HGBUR SMALL (A) 06/01/2016 1227   BILIRUBINUR NEGATIVE 06/01/2016 1227   KETONESUR NEGATIVE 06/01/2016 1227   PROTEINUR 100 (A) 06/01/2016 1227   NITRITE NEGATIVE 06/01/2016 1227   LEUKOCYTESUR MODERATE (A) 06/01/2016 1227   Sepsis Labs Invalid input(s): PROCALCITONIN,  WBC,  LACTICIDVEN Microbiology No results found for this or any previous visit (from the past 240 hour(s)).  Time coordinating discharge: 33 minutes  SIGNED:   Standley Dakins, MD  [facility] 06/03/2016, 12:37 PM Pager   If 7PM-7AM, please contact night-coverage [email or web address] Password [email or web address]

## 2016-06-10 ENCOUNTER — Encounter (HOSPITAL_COMMUNITY): Payer: Self-pay

## 2016-06-10 ENCOUNTER — Ambulatory Visit (HOSPITAL_COMMUNITY): Payer: Medicaid Other | Admitting: Psychiatry

## 2016-06-15 ENCOUNTER — Telehealth (HOSPITAL_COMMUNITY): Payer: Self-pay | Admitting: *Deleted

## 2016-06-15 NOTE — Telephone Encounter (Signed)
Pt would like to reschedule appt. Pt has 3 "no shows". Would like to reschedule appt? Informed pt office will contact her tomorrow. Pt cb # [phone].

## 2016-06-16 ENCOUNTER — Telehealth (HOSPITAL_COMMUNITY): Payer: Self-pay | Admitting: *Deleted

## 2016-06-16 ENCOUNTER — Other Ambulatory Visit (HOSPITAL_COMMUNITY): Payer: Self-pay | Admitting: Psychiatry

## 2016-06-16 MED ORDER — AMPHETAMINE-DEXTROAMPHETAMINE 20 MG PO TABS: 20.0000 mg | ORAL_TABLET | Freq: Three times a day (TID) | ORAL | 0 refills | Status: DC

## 2016-06-16 MED ORDER — BUSPIRONE HCL 10 MG PO TABS: 10.0000 mg | ORAL_TABLET | Freq: Three times a day (TID) | ORAL | 2 refills | Status: DC

## 2016-06-16 MED ORDER — BUPROPION HCL ER (XL) 150 MG PO TB24: 150.0000 mg | ORAL_TABLET | ORAL | 2 refills | Status: DC

## 2016-06-16 MED ORDER — ALPRAZOLAM 1 MG PO TABS: 1.0000 mg | ORAL_TABLET | Freq: Three times a day (TID) | ORAL | 2 refills | Status: DC

## 2016-06-16 NOTE — Telephone Encounter (Signed)
Pt called wanting to make an appt with Dr. Tenny Crawoss. Pt was informed by staff that staff was unable to sch appt right now due to no show policy. Per pt Chart, pt no showed with Dr. Tenny Crawoss on 06-10-2016, 06-03-2016, 05-22-2016, 07-24-2015 and no showed with Peggy on 03-27-2014. Pt number is [phone].

## 2016-06-16 NOTE — Telephone Encounter (Signed)
She can have one more chance to see me, then that's it

## 2016-06-16 NOTE — Telephone Encounter (Signed)
buspar and wellbutrin sent. Others printed

## 2016-06-16 NOTE — Telephone Encounter (Signed)
phone call from patient.   She had two NO Shows back to back.  She said she was in the hospital both times.  She is scheduled for 07/01/16, but said she will be out of her meds on 06/25/16 and need refills.

## 2016-06-16 NOTE — Telephone Encounter (Signed)
Message sent to provider 

## 2016-06-17 NOTE — Telephone Encounter (Signed)
Spoke with pt and informed her with what provider stated and she verbalized understanding  

## 2016-06-17 NOTE — Telephone Encounter (Signed)
Called pt and informed her that printed script is ready for pick up 

## 2016-06-23 ENCOUNTER — Encounter (HOSPITAL_COMMUNITY): Payer: Self-pay | Admitting: *Deleted

## 2016-06-23 NOTE — Progress Notes (Signed)
Pt came into office to pick up her printed script for her Xanax and Adderall. Scripts ID number is [id number]. Xanax order ID is [id number] and Adderall order ID number is [id number]. Pt showed understanding.

## 2016-07-01 ENCOUNTER — Ambulatory Visit (INDEPENDENT_AMBULATORY_CARE_PROVIDER_SITE_OTHER): Payer: Medicaid Other | Admitting: Psychiatry

## 2016-07-01 ENCOUNTER — Encounter (HOSPITAL_COMMUNITY): Payer: Self-pay | Admitting: Psychiatry

## 2016-07-01 ENCOUNTER — Encounter (INDEPENDENT_AMBULATORY_CARE_PROVIDER_SITE_OTHER): Payer: Self-pay

## 2016-07-01 VITALS — BP 158/100 | HR 71 | Ht 63.0 in | Wt 153.4 lb

## 2016-07-01 DIAGNOSIS — Z79899 Other long term (current) drug therapy: Secondary | ICD-10-CM

## 2016-07-01 DIAGNOSIS — Z818 Family history of other mental and behavioral disorders: Secondary | ICD-10-CM

## 2016-07-01 DIAGNOSIS — F411 Generalized anxiety disorder: Secondary | ICD-10-CM | POA: Diagnosis not present

## 2016-07-01 DIAGNOSIS — Z888 Allergy status to other drugs, medicaments and biological substances status: Secondary | ICD-10-CM | POA: Diagnosis not present

## 2016-07-01 DIAGNOSIS — F902 Attention-deficit hyperactivity disorder, combined type: Secondary | ICD-10-CM | POA: Diagnosis not present

## 2016-07-01 DIAGNOSIS — Z811 Family history of alcohol abuse and dependence: Secondary | ICD-10-CM

## 2016-07-01 MED ORDER — AMPHETAMINE-DEXTROAMPHETAMINE 20 MG PO TABS: 20.0000 mg | ORAL_TABLET | Freq: Three times a day (TID) | ORAL | 0 refills | Status: DC

## 2016-07-01 MED ORDER — BUPROPION HCL ER (XL) 150 MG PO TB24: 150.0000 mg | ORAL_TABLET | ORAL | 2 refills | Status: DC

## 2016-07-01 MED ORDER — BUSPIRONE HCL 10 MG PO TABS: 10.0000 mg | ORAL_TABLET | Freq: Three times a day (TID) | ORAL | 2 refills | Status: DC

## 2016-07-01 MED ORDER — ALPRAZOLAM 1 MG PO TABS: 1.0000 mg | ORAL_TABLET | Freq: Three times a day (TID) | ORAL | 2 refills | Status: DC
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
## 2016-07-20 ENCOUNTER — Other Ambulatory Visit (HOSPITAL_COMMUNITY): Payer: Self-pay | Admitting: Psychiatry

## 2016-08-12 ENCOUNTER — Other Ambulatory Visit (HOSPITAL_COMMUNITY): Payer: Self-pay | Admitting: Psychiatry

## 2016-08-13 ENCOUNTER — Other Ambulatory Visit (HOSPITAL_COMMUNITY): Payer: Self-pay | Admitting: Psychiatry

## 2016-08-25 ENCOUNTER — Encounter (HOSPITAL_COMMUNITY): Payer: Self-pay | Admitting: Psychiatry

## 2016-08-25 ENCOUNTER — Ambulatory Visit (INDEPENDENT_AMBULATORY_CARE_PROVIDER_SITE_OTHER): Payer: Medicaid Other | Admitting: Psychiatry

## 2016-08-25 VITALS — BP 156/111 | HR 78 | Ht 63.0 in | Wt 155.2 lb

## 2016-08-25 DIAGNOSIS — F411 Generalized anxiety disorder: Secondary | ICD-10-CM

## 2016-08-25 DIAGNOSIS — F902 Attention-deficit hyperactivity disorder, combined type: Secondary | ICD-10-CM

## 2016-08-25 DIAGNOSIS — Z818 Family history of other mental and behavioral disorders: Secondary | ICD-10-CM | POA: Diagnosis not present

## 2016-08-25 MED ORDER — TRAZODONE HCL 50 MG PO TABS: 50.0000 mg | ORAL_TABLET | Freq: Every day | ORAL | 2 refills | Status: DC

## 2016-08-25 MED ORDER — BUSPIRONE HCL 10 MG PO TABS: 10.0000 mg | ORAL_TABLET | Freq: Three times a day (TID) | ORAL | 2 refills | Status: DC

## 2016-08-25 MED ORDER — AMPHETAMINE-DEXTROAMPHETAMINE 20 MG PO TABS: 20.0000 mg | ORAL_TABLET | Freq: Three times a day (TID) | ORAL | 0 refills | Status: DC

## 2016-08-25 MED ORDER — BUPROPION HCL ER (XL) 150 MG PO TB24: 150.0000 mg | ORAL_TABLET | ORAL | 2 refills | Status: DC

## 2016-08-25 MED ORDER — ALPRAZOLAM 1 MG PO TABS: 1.0000 mg | ORAL_TABLET | Freq: Three times a day (TID) | ORAL | 2 refills | Status: DC
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
## 2016-10-12 ENCOUNTER — Other Ambulatory Visit (HOSPITAL_COMMUNITY): Payer: Self-pay | Admitting: Psychiatry

## 2016-10-16 ENCOUNTER — Telehealth (HOSPITAL_COMMUNITY): Payer: Self-pay | Admitting: *Deleted

## 2016-10-16 NOTE — Telephone Encounter (Signed)
Prior authorization for Adderall received. Called [facility] spoke with [place] who states that an over ride code is needed on the pharmacy end and they must document why dosage is needed on the hard copy. Called to notify pharmacy and they did get it approved.

## 2016-10-19 NOTE — Telephone Encounter (Signed)
noted 

## 2016-11-13 ENCOUNTER — Telehealth (HOSPITAL_COMMUNITY): Payer: Self-pay | Admitting: *Deleted

## 2016-11-18 ENCOUNTER — Encounter (HOSPITAL_COMMUNITY): Payer: Self-pay | Admitting: Psychiatry

## 2016-11-18 ENCOUNTER — Ambulatory Visit (INDEPENDENT_AMBULATORY_CARE_PROVIDER_SITE_OTHER): Payer: Medicaid Other | Admitting: Psychiatry

## 2016-11-18 VITALS — BP 211/111 | HR 80 | Ht 63.0 in | Wt 158.0 lb

## 2016-11-18 DIAGNOSIS — F902 Attention-deficit hyperactivity disorder, combined type: Secondary | ICD-10-CM | POA: Diagnosis not present

## 2016-11-18 DIAGNOSIS — F411 Generalized anxiety disorder: Secondary | ICD-10-CM

## 2016-11-18 DIAGNOSIS — Z818 Family history of other mental and behavioral disorders: Secondary | ICD-10-CM | POA: Diagnosis not present

## 2016-11-18 DIAGNOSIS — Z811 Family history of alcohol abuse and dependence: Secondary | ICD-10-CM

## 2016-11-18 MED ORDER — AMPHETAMINE-DEXTROAMPHETAMINE 20 MG PO TABS: 20.0000 mg | ORAL_TABLET | Freq: Two times a day (BID) | ORAL | 0 refills | Status: DC

## 2016-11-18 MED ORDER — ALPRAZOLAM 1 MG PO TABS: 1.0000 mg | ORAL_TABLET | Freq: Three times a day (TID) | ORAL | 2 refills | Status: DC

## 2016-11-18 MED ORDER — BUSPIRONE HCL 10 MG PO TABS: 10.0000 mg | ORAL_TABLET | Freq: Three times a day (TID) | ORAL | 2 refills | Status: DC

## 2016-11-18 MED ORDER — TRAZODONE HCL 50 MG PO TABS: 50.0000 mg | ORAL_TABLET | Freq: Every day | ORAL | 2 refills | Status: DC

## 2016-11-18 MED ORDER — BUPROPION HCL ER (XL) 150 MG PO TB24: 150.0000 mg | ORAL_TABLET | ORAL | 2 refills | Status: DC
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
## 2017-01-19 ENCOUNTER — Ambulatory Visit (INDEPENDENT_AMBULATORY_CARE_PROVIDER_SITE_OTHER): Payer: Medicaid Other | Admitting: Psychiatry

## 2017-01-19 ENCOUNTER — Encounter (HOSPITAL_COMMUNITY): Payer: Self-pay | Admitting: Psychiatry

## 2017-01-19 VITALS — BP 154/100 | HR 73 | Ht 63.0 in | Wt 154.0 lb

## 2017-01-19 DIAGNOSIS — Z79899 Other long term (current) drug therapy: Secondary | ICD-10-CM

## 2017-01-19 DIAGNOSIS — I10 Essential (primary) hypertension: Secondary | ICD-10-CM | POA: Diagnosis not present

## 2017-01-19 DIAGNOSIS — F17210 Nicotine dependence, cigarettes, uncomplicated: Secondary | ICD-10-CM

## 2017-01-19 DIAGNOSIS — F902 Attention-deficit hyperactivity disorder, combined type: Secondary | ICD-10-CM

## 2017-01-19 DIAGNOSIS — F329 Major depressive disorder, single episode, unspecified: Secondary | ICD-10-CM

## 2017-01-19 DIAGNOSIS — F411 Generalized anxiety disorder: Secondary | ICD-10-CM

## 2017-01-19 MED ORDER — AMPHETAMINE-DEXTROAMPHETAMINE 20 MG PO TABS: 20.0000 mg | ORAL_TABLET | Freq: Two times a day (BID) | ORAL | 0 refills | Status: DC

## 2017-01-19 MED ORDER — TRAZODONE HCL 50 MG PO TABS: 50.0000 mg | ORAL_TABLET | Freq: Every day | ORAL | 2 refills | Status: DC

## 2017-01-19 MED ORDER — ALPRAZOLAM 1 MG PO TABS: 1.0000 mg | ORAL_TABLET | Freq: Three times a day (TID) | ORAL | 2 refills | Status: DC

## 2017-01-19 MED ORDER — BUSPIRONE HCL 10 MG PO TABS: 10.0000 mg | ORAL_TABLET | Freq: Three times a day (TID) | ORAL | 2 refills | Status: DC

## 2017-01-19 MED ORDER — BUPROPION HCL ER (XL) 150 MG PO TB24: 150.0000 mg | ORAL_TABLET | ORAL | 2 refills | Status: DC
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
## 2017-01-29 ENCOUNTER — Encounter: Payer: Self-pay | Admitting: Cardiology

## 2017-01-29 NOTE — Progress Notes (Deleted)
Cardiology Office Note  Date: 01/29/2017   ID: Jean Nelson, DOB Nov 11, 1965, MRN [id number]  PCP: [facility]  Consulting Cardiologist: Nona Dell, MD   No chief complaint on file.   History of Present Illness: Jean Nelson is a 51 y.o. female referred for cardiology consultation by Dr. Charm Barges for the evaluation of hypertension.  Records indicate that she saw Dr. Charm Barges in late August for follow-up of blood pressure, had not been taking her medications regularly at that point. Blood pressure was 219/113. She was started on a door RV 80 mg daily with refill given for Norvasc 10 mg twice daily, and Lopressor 25 mg twice daily.  Past Medical History:  Diagnosis Date  . Back pain   . Hypertension     Past Surgical History:  Procedure Laterality Date  . APPENDECTOMY    . TUBAL LIGATION      Current Outpatient Prescriptions  Medication Sig Dispense Refill  . albuterol (PROVENTIL HFA;VENTOLIN HFA) 108 (90 BASE) MCG/ACT inhaler Inhale 1-2 puffs into the lungs every 6 (six) hours as needed for wheezing (Use the inhaler for the next 4 days 4 times a day then begin using only if needed for wheezing). 1 Inhaler 0  . ALPRAZolam (XANAX) 1 MG tablet Take 1 tablet (1 mg total) by mouth 3 (three) times daily. 90 tablet 2  . amLODipine (NORVASC) 10 MG tablet Take 1 tablet (10 mg total) by mouth daily. 30 tablet 0  . amphetamine-dextroamphetamine (ADDERALL) 20 MG tablet Take 1 tablet (20 mg total) by mouth 2 (two) times daily. 60 tablet 0  . amphetamine-dextroamphetamine (ADDERALL) 20 MG tablet Take 1 tablet (20 mg total) by mouth 2 (two) times daily. 60 tablet 0  . amphetamine-dextroamphetamine (ADDERALL) 20 MG tablet Take 1 tablet (20 mg total) by mouth 2 (two) times daily. 60 tablet 0  . Azilsartan Medoxomil (EDARBI) 80 MG TABS Take 80 mg by mouth daily.    Marland Kitchen buPROPion (WELLBUTRIN XL) 150 MG 24 hr tablet Take 1 tablet (150 mg total) by mouth every morning. 30 tablet 2    . busPIRone (BUSPAR) 10 MG tablet Take 1 tablet (10 mg total) by mouth 3 (three) times daily. 90 tablet 2  . cyclobenzaprine (FLEXERIL) 10 MG tablet Take 1 tablet by mouth 3 (three) times daily as needed for muscle spasms.   5  . metoprolol tartrate 37.5 MG TABS Take 37.5 mg by mouth 2 (two) times daily. 60 tablet 0  . potassium chloride (K-DUR) 10 MEQ tablet Take 10 mEq by mouth daily.    . traZODone (DESYREL) 50 MG tablet Take 1 tablet (50 mg total) by mouth at bedtime. 30 tablet 2   No current facility-administered medications for this visit.    Allergies:  Codeine   Social History: The patient  reports that she has been smoking.  She has been smoking about 1.00 pack per day. She has never used smokeless tobacco. She reports that she drinks alcohol. She reports that she does not use drugs.   Family History: The patient's family history includes ADD / ADHD in her mother; Alcohol abuse in her maternal grandfather and maternal uncle; Depression in her maternal grandfather.   ROS:  Please see the history of present illness. Otherwise, complete review of systems is positive for {NONE DEFAULTED:18576::"none"}.  All other systems are reviewed and negative.   Physical Exam: VS:  There were no vitals taken for this visit., BMI There is no height or weight on  file to calculate BMI.  Wt Readings from Last 3 Encounters:  06/03/16 165 lb 9.6 oz (75.1 kg)  03/16/11 171 lb (77.6 kg)    General: Patient appears comfortable at rest. HEENT: Conjunctiva and lids normal, oropharynx clear with moist mucosa. Neck: Supple, no elevated JVP or carotid bruits, no thyromegaly. Lungs: Clear to auscultation, nonlabored breathing at rest. Cardiac: Regular rate and rhythm, no S3 or significant systolic murmur, no pericardial rub. Abdomen: Soft, nontender, no hepatomegaly, bowel sounds present, no guarding or rebound. Extremities: No pitting edema, distal pulses 2+. Skin: Warm and dry. Musculoskeletal: No  kyphosis. Neuropsychiatric: Alert and oriented x3, affect grossly appropriate.  ECG: I personally reviewed the tracing from 06/01/2016 which showed normal sinus rhythm.  Recent Labwork: 06/01/2016: ALT 10; AST 14 06/02/2016: Hemoglobin 12.3; Magnesium 1.6; Platelets 227 06/03/2016: BUN 10; Creatinine, Ser 0.97; Potassium 3.7; Sodium 139   Other Studies Reviewed Today:  Abdominal and pelvic CT 06/01/2016: IMPRESSION: 1. Findings are consistent with acute bilateral pyelonephritis. Ill-defined edema is appreciated throughout both kidneys, best seen on delayed images. Low density focus within the upper pole of the left renal cortex is suspicious for early developing renal abscess. Recommend follow-up imaging after appropriate course of antibiotics to ensure resolution. 2. Left adrenal mass, measuring 3.1 x 2 cm. CT density measurements are not compatible with a benign lipid rich adrenal adenoma. Recommend nonemergent adrenal protocol MRI after current issues are resolved. 3. Cholelithiasis without evidence of acute cholecystitis. 4. Aortic atherosclerosis.  Assessment and Plan:   Current medicines were reviewed with the patient today.  No orders of the defined types were placed in this encounter.   Disposition:  Signed, Jonelle Sidle, MD, [facility] 01/29/2017 1:54 PM    [facility] [address] [place], [place] [address] Phone: [phone]; Fax: [phone]

## 2017-02-01 ENCOUNTER — Encounter: Payer: Self-pay | Admitting: Cardiology

## 2017-02-01 ENCOUNTER — Ambulatory Visit: Payer: Self-pay | Admitting: Cardiology

## 2017-02-10 ENCOUNTER — Ambulatory Visit (HOSPITAL_COMMUNITY): Payer: Self-pay | Admitting: Psychiatry

## 2017-03-06 ENCOUNTER — Other Ambulatory Visit (HOSPITAL_COMMUNITY): Payer: Self-pay | Admitting: Psychiatry

## 2017-03-25 ENCOUNTER — Other Ambulatory Visit (HOSPITAL_COMMUNITY): Payer: Self-pay | Admitting: Psychiatry

## 2017-04-20 ENCOUNTER — Ambulatory Visit (HOSPITAL_COMMUNITY): Payer: Medicaid Other | Admitting: Psychiatry

## 2017-04-20 ENCOUNTER — Encounter (HOSPITAL_COMMUNITY): Payer: Self-pay | Admitting: Psychiatry

## 2017-04-20 VITALS — BP 168/93 | HR 76 | Ht 63.0 in | Wt 162.0 lb

## 2017-04-20 DIAGNOSIS — F411 Generalized anxiety disorder: Secondary | ICD-10-CM

## 2017-04-20 DIAGNOSIS — F17210 Nicotine dependence, cigarettes, uncomplicated: Secondary | ICD-10-CM | POA: Diagnosis not present

## 2017-04-20 DIAGNOSIS — Z818 Family history of other mental and behavioral disorders: Secondary | ICD-10-CM | POA: Diagnosis not present

## 2017-04-20 DIAGNOSIS — F332 Major depressive disorder, recurrent severe without psychotic features: Secondary | ICD-10-CM | POA: Diagnosis not present

## 2017-04-20 DIAGNOSIS — Z811 Family history of alcohol abuse and dependence: Secondary | ICD-10-CM

## 2017-04-20 DIAGNOSIS — F902 Attention-deficit hyperactivity disorder, combined type: Secondary | ICD-10-CM | POA: Diagnosis not present

## 2017-04-20 MED ORDER — BUPROPION HCL ER (XL) 150 MG PO TB24: 150.0000 mg | ORAL_TABLET | ORAL | 2 refills | Status: DC

## 2017-04-20 MED ORDER — TRAZODONE HCL 50 MG PO TABS: 50.0000 mg | ORAL_TABLET | Freq: Every day | ORAL | 2 refills | Status: DC

## 2017-04-20 MED ORDER — ALPRAZOLAM 1 MG PO TABS: 1.0000 mg | ORAL_TABLET | Freq: Three times a day (TID) | ORAL | 2 refills | Status: DC

## 2017-04-20 MED ORDER — BUSPIRONE HCL 10 MG PO TABS: 10.0000 mg | ORAL_TABLET | Freq: Three times a day (TID) | ORAL | 2 refills | Status: DC

## 2017-04-20 MED ORDER — AMPHETAMINE-DEXTROAMPHETAMINE 20 MG PO TABS: 20.0000 mg | ORAL_TABLET | Freq: Two times a day (BID) | ORAL | 0 refills | Status: DC
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
## 2017-07-19 ENCOUNTER — Ambulatory Visit (HOSPITAL_COMMUNITY): Payer: Medicaid Other | Admitting: Psychiatry

## 2017-07-20 ENCOUNTER — Encounter (HOSPITAL_COMMUNITY): Payer: Self-pay | Admitting: Psychiatry

## 2017-07-20 ENCOUNTER — Ambulatory Visit (HOSPITAL_COMMUNITY): Payer: Medicaid Other | Admitting: Psychiatry

## 2017-07-20 VITALS — BP 159/96 | HR 79 | Ht 63.0 in | Wt 167.0 lb

## 2017-07-20 DIAGNOSIS — F411 Generalized anxiety disorder: Secondary | ICD-10-CM

## 2017-07-20 DIAGNOSIS — F17210 Nicotine dependence, cigarettes, uncomplicated: Secondary | ICD-10-CM | POA: Diagnosis not present

## 2017-07-20 DIAGNOSIS — Z818 Family history of other mental and behavioral disorders: Secondary | ICD-10-CM

## 2017-07-20 DIAGNOSIS — Z811 Family history of alcohol abuse and dependence: Secondary | ICD-10-CM

## 2017-07-20 DIAGNOSIS — F902 Attention-deficit hyperactivity disorder, combined type: Secondary | ICD-10-CM

## 2017-07-20 MED ORDER — ALPRAZOLAM 1 MG PO TABS: 1.0000 mg | ORAL_TABLET | Freq: Three times a day (TID) | ORAL | 2 refills | Status: DC

## 2017-07-20 MED ORDER — BUPROPION HCL ER (XL) 150 MG PO TB24: 150.0000 mg | ORAL_TABLET | ORAL | 2 refills | Status: DC

## 2017-07-20 MED ORDER — BUSPIRONE HCL 10 MG PO TABS: 10.0000 mg | ORAL_TABLET | Freq: Three times a day (TID) | ORAL | 2 refills | Status: DC

## 2017-07-20 MED ORDER — AMPHETAMINE-DEXTROAMPHETAMINE 20 MG PO TABS: 20.0000 mg | ORAL_TABLET | Freq: Two times a day (BID) | ORAL | 0 refills | Status: DC

## 2017-07-20 MED ORDER — TRAZODONE HCL 50 MG PO TABS: 50.0000 mg | ORAL_TABLET | Freq: Every day | ORAL | 2 refills | Status: DC

## 2017-07-20 NOTE — Progress Notes (Signed)
BH MD/PA/NP OP Progress Note  07/20/2017 11:02 AM Jean Nelson  MRN:  [id number]  Chief Complaint:  Chief Complaint    Anxiety; ADHD; Follow-up     HPI: this patient is a 52 year old separated white female who lives alone in [place]. She works as a Pharmacist, hospital.  The patient was referred by Darryl Lent PA from [facility] for further treatment and assessment of ADHD and anxiety.  The patient states that she desires a hyperactive child and has been on Ritalin since elementary school.. She got off medication and high school and got pregnant and quit in the 11th grade. She later got a GED and was reevaluated by her primary doctor and was put on Adderall. She's tried other medicines such as Vyvanse and Concerta but Adderall helped her the most. Without it she is unable to stay focused.  The patient also admits that she's been depressed and anxious since age 69. Bedtime or grandfather killed himself with down in front of her. She still has occasional flashbacks memories and bad dreams about this and became tearful when talking about it. Apparently her mother was a teenager when she had her and her grandparents were actually acting like her real parents. She's never had any counseling to help deal with this.  The patient denies being depressed right now. She's not suicidal. She's overwhelmed because she is the main parent for her grandchildren because her daughter is "too lazy" to do anything with them. She is either taking care of children or working The Xanax 2 mg twice a day is controlling her anxiety symptoms for the most part. Her blood pressure tends to stay high the diastolic often being around 100. I told her this was a risk factor for heart disease and stroke in the 80 mg a day of Adderal may be contributing to this. Her primary provider does have her on Norvasc and lisinopril. I've explained that unless her blood pressure can get control the next 4 weeks and I  going to have to look at other options for ADHD treatment.  The patient returns after 3 months.  She states that she is doing fairly well.  Her daughter and the grandchildren moved out and got their own place so she has much more time to herself.  She and her husband are still separated.  She states that they do better just talking on the phone.  She states that her mood has been good she is generally sleeping well and staying pretty focused.  She is trying to cut down her cigarettes and is under a pack a day now.  Her blood pressure is still a little bit high.  She is also trying to cut down the [employer].   Visit Diagnosis:    ICD-10-CM   1. ADHD (attention deficit hyperactivity disorder), combined type F90.2   2. Generalized anxiety disorder F41.1     Past Psychiatric History: none  Past Medical History:  Past Medical History:  Diagnosis Date  . Anxiety   . Chronic back pain   . Essential hypertension   . Plantar fasciitis   . Sciatica     Family Psychiatric History: See below  Family History:  Family History  Problem Relation Age of Onset  . ADD / ADHD Mother   . Hypertension Mother   . Alcohol abuse Maternal Uncle   . Depression Maternal Grandfather   . Alcohol abuse Maternal Grandfather     Social History:  Social History   Socioeconomic History  . Marital status: Married    Spouse name: None  . Number of children: None  . Years of education: None  . Highest education level: None  Social Needs  . Financial resource strain: None  . Food insecurity - worry: None  . Food insecurity - inability: None  . Transportation needs - medical: None  . Transportation needs - non-medical: None  Occupational History  . None  Tobacco Use  . Smoking status: Current Every Day Smoker    Packs/day: 1.00    Types: Cigarettes  . Smokeless tobacco: Never Used  Substance and Sexual Activity  . Alcohol use: Yes  . Drug use: No  . Sexual activity: Yes    Birth  control/protection: Surgical  Other Topics Concern  . None  Social History Narrative  . None    Allergies:  Allergies  Allergen Reactions  . Codeine Hives    Metabolic Disorder Labs: No results found for: HGBA1C, MPG No results found for: PROLACTIN No results found for: CHOL, TRIG, HDL, CHOLHDL, VLDL, LDLCALC No results found for: TSH  Therapeutic Level Labs: No results found for: LITHIUM No results found for: VALPROATE No components found for:  CBMZ  Current Medications: Current Outpatient Medications  Medication Sig Dispense Refill  . ALPRAZolam (XANAX) 1 MG tablet Take 1 tablet (1 mg total) by mouth 3 (three) times daily. 90 tablet 2  . amLODipine (NORVASC) 10 MG tablet Take 1 tablet (10 mg total) by mouth daily. 30 tablet 0  . amphetamine-dextroamphetamine (ADDERALL) 20 MG tablet Take 1 tablet (20 mg total) by mouth 2 (two) times daily. 60 tablet 0  . amphetamine-dextroamphetamine (ADDERALL) 20 MG tablet Take 1 tablet (20 mg total) by mouth 2 (two) times daily. 60 tablet 0  . amphetamine-dextroamphetamine (ADDERALL) 20 MG tablet Take 1 tablet (20 mg total) by mouth 2 (two) times daily. 60 tablet 0  . Azilsartan Medoxomil (EDARBI) 80 MG TABS Take 80 mg by mouth daily.    Marland Kitchen buPROPion (WELLBUTRIN XL) 150 MG 24 hr tablet Take 1 tablet (150 mg total) by mouth every morning. 30 tablet 2  . busPIRone (BUSPAR) 10 MG tablet Take 1 tablet (10 mg total) by mouth 3 (three) times daily. 90 tablet 2  . cyclobenzaprine (FLEXERIL) 10 MG tablet Take 1 tablet by mouth 3 (three) times daily as needed for muscle spasms.   5  . metoprolol tartrate 37.5 MG TABS Take 37.5 mg by mouth 2 (two) times daily. 60 tablet 0  . potassium chloride (K-DUR) 10 MEQ tablet Take 10 mEq by mouth daily.    . traZODone (DESYREL) 50 MG tablet Take 1 tablet (50 mg total) by mouth at bedtime. 30 tablet 2  . albuterol (PROVENTIL HFA;VENTOLIN HFA) 108 (90 BASE) MCG/ACT inhaler Inhale 1-2 puffs into the lungs every 6  (six) hours as needed for wheezing (Use the inhaler for the next 4 days 4 times a day then begin using only if needed for wheezing). 1 Inhaler 0   No current facility-administered medications for this visit.      Musculoskeletal: Strength & Muscle Tone: within normal limits Gait & Station: normal Patient leans: N/A  Psychiatric Specialty Exam: Review of Systems  Genitourinary: Positive for frequency and urgency.  All other systems reviewed and are negative.   Blood pressure (!) 159/96, pulse 79, height 5\' 3"  (1.6 m), weight 167 lb (75.8 kg), SpO2 98 %.Body mass index is 29.58 kg/m.  General Appearance: Casual, Neat  and Well Groomed  Eye Contact:  Good  Speech:  Pressured  Volume:  Normal  Mood:  Anxious  Affect:  Congruent  Thought Process:  Goal Directed  Orientation:  Full (Time, Place, and Person)  Thought Content: Rumination   Suicidal Thoughts:  No  Homicidal Thoughts:  No  Memory:  Immediate;   Good Recent;   Good Remote;   Good  Judgement:  Fair  Insight:  Fair  Psychomotor Activity:  Restlessness  Concentration:  Concentration: Good and Attention Span: Good  Recall:  Good  Fund of Knowledge: Good  Language: Good  Akathisia:  No  Handed:  Right  AIMS (if indicated): not done  Assets:  Communication Skills Desire for Improvement Resilience Social Support Talents/Skills Transportation  ADL's:  Intact  Cognition: WNL  Sleep:  Good   Screenings:   Assessment and Plan: This patient is a 52 year old female with a history of depression anxiety and ADHD.  She continues to do well on her current regimen.  She will continue trazodone 50 mg at bedtime for sleep, Wellbutrin XL 150 mg daily for mood, BuSpar 10 mg 3 times a day for anxiety as well as Xanax 1 mg 3 times a day for anxiety and Adderall 20 mg twice a day for ADHD.  She is continuing to try to improve her diet and cut down her cigarettes.  She will return to see me in 3 months   Diannia Rudereborah Ross,  MD 07/20/2017, 11:02 AM

## 2017-10-20 ENCOUNTER — Other Ambulatory Visit (HOSPITAL_COMMUNITY): Payer: Self-pay | Admitting: Psychiatry

## 2017-10-20 ENCOUNTER — Ambulatory Visit (HOSPITAL_COMMUNITY): Payer: Medicaid Other | Admitting: Psychiatry

## 2017-10-25 ENCOUNTER — Telehealth (HOSPITAL_COMMUNITY): Payer: Self-pay | Admitting: *Deleted

## 2017-10-25 ENCOUNTER — Other Ambulatory Visit (HOSPITAL_COMMUNITY): Payer: Self-pay | Admitting: Psychiatry

## 2017-10-25 MED ORDER — AMPHETAMINE-DEXTROAMPHETAMINE 20 MG PO TABS: 20.0000 mg | ORAL_TABLET | Freq: Two times a day (BID) | ORAL | 0 refills | Status: DC

## 2017-10-25 NOTE — Telephone Encounter (Signed)
ordered

## 2017-10-25 NOTE — Telephone Encounter (Signed)
Dr Vanetta ShawlHisada Dr Tenny Crawoss patient has been bumped/reschedule  X 2 due to provider's schedule. Patient is requesting refill on Adderall & next appointment is 11/17/17

## 2017-11-03 ENCOUNTER — Ambulatory Visit (HOSPITAL_COMMUNITY): Payer: Medicaid Other | Admitting: Psychiatry

## 2017-11-17 ENCOUNTER — Encounter (HOSPITAL_COMMUNITY): Payer: Self-pay | Admitting: Psychiatry

## 2017-11-17 ENCOUNTER — Ambulatory Visit (HOSPITAL_COMMUNITY): Payer: Self-pay | Admitting: Psychiatry

## 2017-11-17 VITALS — BP 171/89 | HR 80 | Resp 16 | Wt 159.8 lb

## 2017-11-17 DIAGNOSIS — Z813 Family history of other psychoactive substance abuse and dependence: Secondary | ICD-10-CM

## 2017-11-17 DIAGNOSIS — Z811 Family history of alcohol abuse and dependence: Secondary | ICD-10-CM

## 2017-11-17 DIAGNOSIS — F411 Generalized anxiety disorder: Secondary | ICD-10-CM

## 2017-11-17 DIAGNOSIS — F902 Attention-deficit hyperactivity disorder, combined type: Secondary | ICD-10-CM

## 2017-11-17 DIAGNOSIS — F17210 Nicotine dependence, cigarettes, uncomplicated: Secondary | ICD-10-CM

## 2017-11-17 DIAGNOSIS — Z818 Family history of other mental and behavioral disorders: Secondary | ICD-10-CM

## 2017-11-17 MED ORDER — AMPHETAMINE-DEXTROAMPHETAMINE 20 MG PO TABS: 20.0000 mg | ORAL_TABLET | Freq: Two times a day (BID) | ORAL | 0 refills | Status: DC

## 2017-11-17 MED ORDER — ALPRAZOLAM 1 MG PO TABS: ORAL_TABLET | ORAL | 2 refills | Status: DC

## 2017-11-17 MED ORDER — TRAZODONE HCL 50 MG PO TABS: 50.0000 mg | ORAL_TABLET | Freq: Every day | ORAL | 2 refills | Status: DC

## 2017-11-17 MED ORDER — AMPHETAMINE-DEXTROAMPHETAMINE 20 MG PO TABS
20.0000 mg | ORAL_TABLET | Freq: Two times a day (BID) | ORAL | 0 refills | Status: DC
Start: 2017-11-17 — End: 2018-01-24

## 2017-11-17 MED ORDER — BUPROPION HCL ER (XL) 150 MG PO TB24: 150.0000 mg | ORAL_TABLET | ORAL | 2 refills | Status: DC

## 2017-11-17 MED ORDER — BUSPIRONE HCL 10 MG PO TABS: 10.0000 mg | ORAL_TABLET | Freq: Three times a day (TID) | ORAL | 2 refills | Status: DC

## 2017-11-17 NOTE — Progress Notes (Signed)
BH MD/PA/NP OP Progress Note  11/17/2017 3:43 PM Jean Nelson  MRN:  [id number]  Chief Complaint:  Chief Complaint    Depression; Anxiety; ADHD; Follow-up     HPI: this patient is a 52 year old separated white female who lives alonein [place]. She works as a Pharmacist, hospital.  The patient was referred by Darryl Lent PA from [facility] for further treatment and assessment of ADHD and anxiety.  The patient states that she desires a hyperactive child and has been on Ritalin since elementary school.. She got off medication and high school and got pregnant and quit in the 11th grade. She later got a GED and was reevaluated by her primary doctor and was put on Adderall. She's tried other medicines such as Vyvanse and Concerta but Adderall helped her the most. Without it she is unable to stay focused.  The patient also admits that she's been depressed and anxious since age 54. Bedtime or grandfather killed himself with down in front of her. She still has occasional flashbacks memories and bad dreams about this and became tearful when talking about it. Apparently her mother was a teenager when she had her and her grandparents were actually acting like her real parents. She's never had any counseling to help deal with this.  The patient denies being depressed right now. She's not suicidal. She's overwhelmed because she is the main parent for her grandchildren because her daughter is "too lazy" to do anything with them. She is either taking care of children or working The Xanax 2 mg twice a day is controlling her anxiety symptoms for the most part. Her blood pressure tends to stay high the diastolic often being around 100. I told her this was a risk factor for heart disease and stroke in the 80 mg a day of Adderal may be contributing to this. Her primary provider does have her on Norvasc and lisinopril. I've explained that unless her blood pressure can get control the next 4  weeks and I going to have to look at other options for ADHD treatment.  The patient returns after 3 months.  She states overall she is doing well.  She is now working at a [employer] and really enjoys it.  She has cut down her smoking.  Her blood pressure is still somewhat high but better than it used to be.  Her family has moved out and she and her husband are divorcing she thinks this is a good thing and she feels better off living on her own.  She denies significant symptoms of depression or anxiety and she is sleeping well her focus is good Visit Diagnosis:    ICD-10-CM   1. ADHD (attention deficit hyperactivity disorder), combined type F90.2   2. Generalized anxiety disorder F41.1     Past Psychiatric History: none  Past Medical History:  Past Medical History:  Diagnosis Date  . Anxiety   . Chronic back pain   . Essential hypertension   . Plantar fasciitis   . Sciatica     Past Surgical History:  Procedure Laterality Date  . APPENDECTOMY    . TUBAL LIGATION      Family Psychiatric History: See below  Family History:  Family History  Problem Relation Age of Onset  . ADD / ADHD Mother   . Hypertension Mother   . Alcohol abuse Maternal Uncle   . Depression Maternal Grandfather   . Alcohol abuse Maternal Grandfather     Social  History:  Social History   Socioeconomic History  . Marital status: Married    Spouse name: Not on file  . Number of children: Not on file  . Years of education: Not on file  . Highest education level: Not on file  Occupational History  . Not on file  Social Needs  . Financial resource strain: Not on file  . Food insecurity:    Worry: Not on file    Inability: Not on file  . Transportation needs:    Medical: Not on file    Non-medical: Not on file  Tobacco Use  . Smoking status: Current Every Day Smoker    Packs/day: 1.00    Types: Cigarettes  . Smokeless tobacco: Never Used  Substance and Sexual Activity  . Alcohol use: Yes   . Drug use: No  . Sexual activity: Yes    Birth control/protection: Surgical  Lifestyle  . Physical activity:    Days per week: Not on file    Minutes per session: Not on file  . Stress: Not on file  Relationships  . Social connections:    Talks on phone: Not on file    Gets together: Not on file    Attends religious service: Not on file    Active member of club or organization: Not on file    Attends meetings of clubs or organizations: Not on file    Relationship status: Not on file  Other Topics Concern  . Not on file  Social History Narrative  . Not on file    Allergies:  Allergies  Allergen Reactions  . Codeine Hives    Metabolic Disorder Labs: No results found for: HGBA1C, MPG No results found for: PROLACTIN No results found for: CHOL, TRIG, HDL, CHOLHDL, VLDL, LDLCALC No results found for: TSH  Therapeutic Level Labs: No results found for: LITHIUM No results found for: VALPROATE No components found for:  CBMZ  Current Medications: Current Outpatient Medications  Medication Sig Dispense Refill  . albuterol (PROVENTIL HFA;VENTOLIN HFA) 108 (90 BASE) MCG/ACT inhaler Inhale 1-2 puffs into the lungs every 6 (six) hours as needed for wheezing (Use the inhaler for the next 4 days 4 times a day then begin using only if needed for wheezing). 1 Inhaler 0  . ALPRAZolam (XANAX) 1 MG tablet TAKE 1 TABLET(1 MG) BY MOUTH THREE TIMES DAILY 90 tablet 2  . amLODipine (NORVASC) 10 MG tablet Take 1 tablet (10 mg total) by mouth daily. 30 tablet 0  . amphetamine-dextroamphetamine (ADDERALL) 20 MG tablet Take 1 tablet (20 mg total) by mouth 2 (two) times daily. 60 tablet 0  . amphetamine-dextroamphetamine (ADDERALL) 20 MG tablet Take 1 tablet (20 mg total) by mouth 2 (two) times daily. 60 tablet 0  . amphetamine-dextroamphetamine (ADDERALL) 20 MG tablet Take 1 tablet (20 mg total) by mouth 2 (two) times daily. 60 tablet 0  . Azilsartan Medoxomil (EDARBI) 80 MG TABS Take 80 mg by  mouth daily.    Marland Kitchen. buPROPion (WELLBUTRIN XL) 150 MG 24 hr tablet Take 1 tablet (150 mg total) by mouth every morning. 30 tablet 2  . busPIRone (BUSPAR) 10 MG tablet Take 1 tablet (10 mg total) by mouth 3 (three) times daily. 90 tablet 2  . cyclobenzaprine (FLEXERIL) 10 MG tablet Take 1 tablet by mouth 3 (three) times daily as needed for muscle spasms.   5  . metoprolol tartrate 37.5 MG TABS Take 37.5 mg by mouth 2 (two) times daily. 60 tablet 0  .  potassium chloride (K-DUR) 10 MEQ tablet Take 10 mEq by mouth daily.    . traZODone (DESYREL) 50 MG tablet Take 1 tablet (50 mg total) by mouth at bedtime. 30 tablet 2   No current facility-administered medications for this visit.      Musculoskeletal: Strength & Muscle Tone: within normal limits Gait & Station: normal Patient leans: N/A  Psychiatric Specialty Exam: Review of Systems  All other systems reviewed and are negative.   Blood pressure (!) 171/89, pulse 80, resp. rate 16, weight 159 lb 12.8 oz (72.5 kg).Body mass index is 28.31 kg/m.  General Appearance: Casual and Fairly Groomed  Eye Contact:  Good  Speech:  Clear and Coherent  Volume:  Normal  Mood:  Euthymic  Affect:  Congruent  Thought Process:  Goal Directed  Orientation:  Full (Time, Place, and Person)  Thought Content: WDL   Suicidal Thoughts:  No  Homicidal Thoughts:  No  Memory:  Immediate;   Good Recent;   Good Remote;   Good  Judgement:  Fair  Insight:  Fair  Psychomotor Activity:  Normal  Concentration:  Concentration: Good and Attention Span: Good  Recall:  Good  Fund of Knowledge: Good  Language: Good  Akathisia:  No  Handed:  Right  AIMS (if indicated): not done  Assets:  Communication Skills Desire for Improvement Physical Health Resilience Social Support Talents/Skills  ADL's:  Intact  Cognition: WNL  Sleep:  Good   Screenings:   Assessment and Plan: A 50-year-old female with a history of depression anxiety and ADD.  She continues to do  well but still needs to quit smoking and try to get her blood pressure down.  She will continue trazodone 25-50 mill grams at bedtime for sleep, BuSpar 10 mg 3 times daily for anxiety, Wellbutrin XL 150  mg daily for depression, Xanax 1 mg 3 times daily for anxiety and Adderall 20 mg twice daily for ADHD.  She will return to see me in 3 months   [name], MD 11/17/2017, 3:43 PM

## 2018-01-24 ENCOUNTER — Ambulatory Visit (HOSPITAL_COMMUNITY): Payer: Self-pay | Admitting: Psychiatry

## 2018-01-24 ENCOUNTER — Encounter (HOSPITAL_COMMUNITY): Payer: Self-pay | Admitting: Psychiatry

## 2018-01-24 VITALS — BP 178/108 | HR 66 | Ht 63.0 in | Wt 164.0 lb

## 2018-01-24 DIAGNOSIS — F411 Generalized anxiety disorder: Secondary | ICD-10-CM

## 2018-01-24 DIAGNOSIS — Z818 Family history of other mental and behavioral disorders: Secondary | ICD-10-CM

## 2018-01-24 DIAGNOSIS — F17210 Nicotine dependence, cigarettes, uncomplicated: Secondary | ICD-10-CM

## 2018-01-24 DIAGNOSIS — Z811 Family history of alcohol abuse and dependence: Secondary | ICD-10-CM

## 2018-01-24 DIAGNOSIS — F902 Attention-deficit hyperactivity disorder, combined type: Secondary | ICD-10-CM

## 2018-01-24 MED ORDER — AMPHETAMINE-DEXTROAMPHETAMINE 20 MG PO TABS: 20.0000 mg | ORAL_TABLET | Freq: Two times a day (BID) | ORAL | 0 refills | Status: DC

## 2018-01-24 MED ORDER — BUSPIRONE HCL 10 MG PO TABS: 10.0000 mg | ORAL_TABLET | Freq: Three times a day (TID) | ORAL | 2 refills | Status: DC

## 2018-01-24 MED ORDER — BUPROPION HCL ER (XL) 150 MG PO TB24: 150.0000 mg | ORAL_TABLET | ORAL | 2 refills | Status: DC

## 2018-01-24 MED ORDER — TRAZODONE HCL 50 MG PO TABS: 50.0000 mg | ORAL_TABLET | Freq: Every day | ORAL | 2 refills | Status: DC

## 2018-01-24 MED ORDER — ALPRAZOLAM 1 MG PO TABS: ORAL_TABLET | ORAL | 2 refills | Status: DC

## 2018-01-24 NOTE — Progress Notes (Signed)
BH MD/PA/NP OP Progress Note  01/24/2018 10:02 AM Jean Nelson  MRN:  [id number]  Chief Complaint:  Chief Complaint    Depression; Anxiety; ADHD; Follow-up     HPI: this patient is a 52 year old divorced white female who livesalonein [place]. She is currently unemployed  The patient was referred by Darryl Lent PA from [facility] for further treatment and assessment of ADHD and anxiety.  The patient states that she desires a hyperactive child and has been on Ritalin since elementary school.. She got off medication and high school and got pregnant and quit in the 11th grade. She later got a GED and was reevaluated by her primary doctor and was put on Adderall. She's tried other medicines such as Vyvanse and Concerta but Adderall helped her the most. Without it she is unable to stay focused.  The patient also admits that she's been depressed and anxious since age 89. Bedtime or grandfather killed himself with down in front of her. She still has occasional flashbacks memories and bad dreams about this and became tearful when talking about it. Apparently her mother was a teenager when she had her and her grandparents were actually acting like her real parents. She's never had any counseling to help deal with this.  The patient denies being depressed right now. She's not suicidal. She's overwhelmed because she is the main parent for her grandchildren because her daughter is "too lazy" to do anything with them. She is either taking care of children or working The Xanax 2 mg twice a day is controlling her anxiety symptoms for the most part. Her blood pressure tends to stay high the diastolic often being around 100. I told her this was a risk factor for heart disease and stroke in the 80 mg a day of Adderal may be contributing to this. Her primary provider does have her on Norvasc and lisinopril. I've explained that unless her blood pressure can get control the next 4 weeks and I  going to have to look at other options for ADHD treatment.  The patient returns after 3 months.  She states that her 72 year old son is working for [employer] and has gotten a job temporarily in [place].  He did not want to go alone and she is spent the last 6 weeks there.  She is going back up with him for another 6 weeks and then hope she will feel comfortable enough to stay there on his own.  For the most part is going okay but it is not like being at home.  She states her and anxiety and depression are in good control.  She is sleeping well and her energy is good.  She denies being suicidal.  Her blood pressure is still high today.  She claims it is much lower at home and she is on a new drug for this. Visit Diagnosis:    ICD-10-CM   1. ADHD (attention deficit hyperactivity disorder), combined type F90.2   2. Generalized anxiety disorder F41.1     Past Psychiatric History: none  Past Medical History:  Past Medical History:  Diagnosis Date  . Anxiety   . Chronic back pain   . Essential hypertension   . Plantar fasciitis   . Sciatica     Past Surgical History:  Procedure Laterality Date  . APPENDECTOMY    . TUBAL LIGATION      Family Psychiatric History: see below  Family History:  Family History  Problem Relation Age of Onset  .  ADD / ADHD Mother   . Hypertension Mother   . Alcohol abuse Maternal Uncle   . Depression Maternal Grandfather   . Alcohol abuse Maternal Grandfather     Social History:  Social History   Socioeconomic History  . Marital status: Married    Spouse name: Not on file  . Number of children: Not on file  . Years of education: Not on file  . Highest education level: Not on file  Occupational History  . Not on file  Social Needs  . Financial resource strain: Not on file  . Food insecurity:    Worry: Not on file    Inability: Not on file  . Transportation needs:    Medical: Not on file    Non-medical: Not on file  Tobacco Use  .  Smoking status: Current Every Day Smoker    Packs/day: 1.00    Types: Cigarettes  . Smokeless tobacco: Never Used  Substance and Sexual Activity  . Alcohol use: Yes  . Drug use: No  . Sexual activity: Yes    Birth control/protection: Surgical  Lifestyle  . Physical activity:    Days per week: Not on file    Minutes per session: Not on file  . Stress: Not on file  Relationships  . Social connections:    Talks on phone: Not on file    Gets together: Not on file    Attends religious service: Not on file    Active member of club or organization: Not on file    Attends meetings of clubs or organizations: Not on file    Relationship status: Not on file  Other Topics Concern  . Not on file  Social History Narrative  . Not on file    Allergies:  Allergies  Allergen Reactions  . Codeine Hives    Metabolic Disorder Labs: No results found for: HGBA1C, MPG No results found for: PROLACTIN No results found for: CHOL, TRIG, HDL, CHOLHDL, VLDL, LDLCALC No results found for: TSH  Therapeutic Level Labs: No results found for: LITHIUM No results found for: VALPROATE No components found for:  CBMZ  Current Medications: Current Outpatient Medications  Medication Sig Dispense Refill  . ALPRAZolam (XANAX) 1 MG tablet TAKE 1 TABLET(1 MG) BY MOUTH THREE TIMES DAILY 90 tablet 2  . amLODipine (NORVASC) 10 MG tablet Take 1 tablet (10 mg total) by mouth daily. 30 tablet 0  . amphetamine-dextroamphetamine (ADDERALL) 20 MG tablet Take 1 tablet (20 mg total) by mouth 2 (two) times daily. 60 tablet 0  . amphetamine-dextroamphetamine (ADDERALL) 20 MG tablet Take 1 tablet (20 mg total) by mouth 2 (two) times daily. 60 tablet 0  . amphetamine-dextroamphetamine (ADDERALL) 20 MG tablet Take 1 tablet (20 mg total) by mouth 2 (two) times daily. 60 tablet 0  . Azilsartan Medoxomil (EDARBI) 80 MG TABS Take 80 mg by mouth daily.    . buPROPion (WEMarland KitchenLLBUTRIN XL) 150 MG 24 hr tablet Take 1 tablet (150 mg  total) by mouth every morning. 30 tablet 2  . busPIRone (BUSPAR) 10 MG tablet Take 1 tablet (10 mg total) by mouth 3 (three) times daily. 90 tablet 2  . cyclobenzaprine (FLEXERIL) 10 MG tablet Take 1 tablet by mouth 3 (three) times daily as needed for muscle spasms.   5  . metoprolol tartrate 37.5 MG TABS Take 37.5 mg by mouth 2 (two) times daily. 60 tablet 0  . potassium chloride (K-DUR) 10 MEQ tablet Take 10 mEq by mouth daily.    .Marland Kitchen  traZODone (DESYREL) 50 MG tablet Take 1 tablet (50 mg total) by mouth at bedtime. 30 tablet 2  . albuterol (PROVENTIL HFA;VENTOLIN HFA) 108 (90 BASE) MCG/ACT inhaler Inhale 1-2 puffs into the lungs every 6 (six) hours as needed for wheezing (Use the inhaler for the next 4 days 4 times a day then begin using only if needed for wheezing). 1 Inhaler 0   No current facility-administered medications for this visit.      Musculoskeletal: Strength & Muscle Tone: within normal limits Gait & Station: normal Patient leans: N/A  Psychiatric Specialty Exam: Review of Systems  All other systems reviewed and are negative.   Blood pressure (!) 178/108, pulse 66, height 5\' 3"  (1.6 m), weight 164 lb (74.4 kg), SpO2 98 %.Body mass index is 29.05 kg/m.  General Appearance: Casual, Neat and Well Groomed  Eye Contact:  Good  Speech:  Clear and Coherent  Volume:  Normal  Mood:  Anxious  Affect:  Congruent  Thought Process:  Goal Directed  Orientation:  Full (Time, Place, and Person)  Thought Content: Rumination   Suicidal Thoughts:  No  Homicidal Thoughts:  No  Memory:  Immediate;   Good Recent;   Good Remote;   Fair  Judgement:  Good  Insight:  Fair  Psychomotor Activity:  Normal  Concentration:  Concentration: Good and Attention Span: Good  Recall:  Good  Fund of Knowledge: Fair  Language: Good  Akathisia:  No  Handed:  Right  AIMS (if indicated): not done  Assets:  Communication Skills Desire for Improvement Resilience Social Support Talents/Skills   ADL's:  Intact  Cognition: WNL  Sleep:  Good   Screenings:   Assessment and Plan: Patient is a 52 year old female with a history of depression and anxiety.  She also has ADHD.  She is doing well on her current regimen but still needs to get the blood pressure down.  She will continue trazodone 50 mill grams at bedtime for sleep, BuSpar 10 mg 3 times daily for anxiety as well as Xanax 1 mg 3 times daily for anxiety, Wellbutrin XL 150 mg every morning for depression and Adderall 20 mg twice daily for ADHD.  She will return to see me in 3 months   [name], MD 01/24/2018, 10:02 AM

## 2018-02-14 ENCOUNTER — Other Ambulatory Visit (HOSPITAL_COMMUNITY): Payer: Self-pay | Admitting: Psychiatry

## 2018-02-17 ENCOUNTER — Ambulatory Visit (HOSPITAL_COMMUNITY): Payer: Self-pay | Admitting: Psychiatry

## 2018-03-24 ENCOUNTER — Encounter (HOSPITAL_COMMUNITY): Payer: Self-pay | Admitting: Psychiatry

## 2018-03-24 ENCOUNTER — Ambulatory Visit (INDEPENDENT_AMBULATORY_CARE_PROVIDER_SITE_OTHER): Payer: Medicaid Other | Admitting: Psychiatry

## 2018-03-24 VITALS — BP 170/110 | HR 73 | Ht 63.0 in | Wt 168.0 lb

## 2018-03-24 DIAGNOSIS — F411 Generalized anxiety disorder: Secondary | ICD-10-CM

## 2018-03-24 DIAGNOSIS — F902 Attention-deficit hyperactivity disorder, combined type: Secondary | ICD-10-CM

## 2018-03-24 MED ORDER — BUPROPION HCL ER (XL) 150 MG PO TB24: 150.0000 mg | ORAL_TABLET | ORAL | 2 refills | Status: DC

## 2018-03-24 MED ORDER — AMPHETAMINE-DEXTROAMPHETAMINE 20 MG PO TABS: 20.0000 mg | ORAL_TABLET | Freq: Two times a day (BID) | ORAL | 0 refills | Status: DC

## 2018-03-24 MED ORDER — BUSPIRONE HCL 10 MG PO TABS: 10.0000 mg | ORAL_TABLET | Freq: Three times a day (TID) | ORAL | 2 refills | Status: DC

## 2018-03-24 MED ORDER — ALPRAZOLAM 1 MG PO TABS: ORAL_TABLET | ORAL | 2 refills | Status: DC

## 2018-03-24 MED ORDER — TRAZODONE HCL 50 MG PO TABS: 50.0000 mg | ORAL_TABLET | Freq: Every day | ORAL | 2 refills | Status: DC

## 2018-03-24 NOTE — Progress Notes (Signed)
BH MD/PA/NP OP Progress Note  03/24/2018 10:15 AM Jean Nelson  MRN:  [id number]  Chief Complaint:  Chief Complaint    Depression; Anxiety; Follow-up     HPI: this patient is a 52 year old divorced white female who livesalonein [place]. She is currently unemployed  The patient was referred by Darryl Lent PA from [facility] for further treatment and assessment of ADHD and anxiety.  The patient states that she desires a hyperactive child and has been on Ritalin since elementary school.. She got off medication and high school and got pregnant and quit in the 11th grade. She later got a GED and was reevaluated by her primary doctor and was put on Adderall. She's tried other medicines such as Vyvanse and Concerta but Adderall helped her the most. Without it she is unable to stay focused.  The patient also admits that she's been depressed and anxious since age 89. Bedtime or grandfather killed himself with down in front of her. She still has occasional flashbacks memories and bad dreams about this and became tearful when talking about it. Apparently her mother was a teenager when she had her and her grandparents were actually acting like her real parents. She's never had any counseling to help deal with this.  The patient denies being depressed right now. She's not suicidal. She's overwhelmed because she is the main parent for her grandchildren because her daughter is "too lazy" to do anything with them. She is either taking care of children or working The Xanax 2 mg twice a day is controlling her anxiety symptoms for the most part. Her blood pressure tends to stay high the diastolic often being around 100. I told her this was a risk factor for heart disease and stroke in the 80 mg a day of Adderal may be contributing to this. Her primary provider does have her on Norvasc and lisinopril. I've explained that unless her blood pressure can get control the next 4 weeks and I going  to have to look at other options for ADHD treatment.  The patient returns after 3 months.  She states that she traveled to [place] to drive her new boyfriend therefore his job.  She states they are getting along fairly well.  Her blood pressure is again high today at 180/118.  We rechecked it several times.  She promises that she will call her primary doctor for an appointment as soon as she leaves here.  She states when she is checks it at home is usually 160/90.  The Adderall does help her focus but I am concerned that perhaps is contributing to her blood pressure problem.  She is going to discuss this with her primary doctor and get back to me.  She denies being depressed or anxious.  Her mood has been good and she denies suicidal ideation.  She is now living by herself and she really likes it. Visit Diagnosis:    ICD-10-CM   1. ADHD (attention deficit hyperactivity disorder), combined type F90.2   2. Generalized anxiety disorder F41.1     Past Psychiatric History: none  Past Medical History:  Past Medical History:  Diagnosis Date  . Anxiety   . Chronic back pain   . Essential hypertension   . Plantar fasciitis   . Sciatica     Past Surgical History:  Procedure Laterality Date  . APPENDECTOMY    . TUBAL LIGATION      Family Psychiatric History: See below  Family History:  Family History  Problem Relation Age of Onset  . ADD / ADHD Mother   . Hypertension Mother   . Alcohol abuse Maternal Uncle   . Depression Maternal Grandfather   . Alcohol abuse Maternal Grandfather     Social History:  Social History   Socioeconomic History  . Marital status: Married    Spouse name: Not on file  . Number of children: Not on file  . Years of education: Not on file  . Highest education level: Not on file  Occupational History  . Not on file  Social Needs  . Financial resource strain: Not on file  . Food insecurity:    Worry: Not on file    Inability: Not on file  .  Transportation needs:    Medical: Not on file    Non-medical: Not on file  Tobacco Use  . Smoking status: Current Every Day Smoker    Packs/day: 1.00    Types: Cigarettes  . Smokeless tobacco: Never Used  Substance and Sexual Activity  . Alcohol use: Yes  . Drug use: No  . Sexual activity: Yes    Birth control/protection: Surgical  Lifestyle  . Physical activity:    Days per week: Not on file    Minutes per session: Not on file  . Stress: Not on file  Relationships  . Social connections:    Talks on phone: Not on file    Gets together: Not on file    Attends religious service: Not on file    Active member of club or organization: Not on file    Attends meetings of clubs or organizations: Not on file    Relationship status: Not on file  Other Topics Concern  . Not on file  Social History Narrative  . Not on file    Allergies:  Allergies  Allergen Reactions  . Codeine Hives    Metabolic Disorder Labs: No results found for: HGBA1C, MPG No results found for: PROLACTIN No results found for: CHOL, TRIG, HDL, CHOLHDL, VLDL, LDLCALC No results found for: TSH  Therapeutic Level Labs: No results found for: LITHIUM No results found for: VALPROATE No components found for:  CBMZ  Current Medications: Current Outpatient Medications  Medication Sig Dispense Refill  . ALPRAZolam (XANAX) 1 MG tablet TAKE 1 TABLET(1 MG) BY MOUTH THREE TIMES DAILY 90 tablet 2  . amLODipine (NORVASC) 10 MG tablet Take 1 tablet (10 mg total) by mouth daily. 30 tablet 0  . amphetamine-dextroamphetamine (ADDERALL) 20 MG tablet Take 1 tablet (20 mg total) by mouth 2 (two) times daily. 60 tablet 0  . amphetamine-dextroamphetamine (ADDERALL) 20 MG tablet Take 1 tablet (20 mg total) by mouth 2 (two) times daily. 60 tablet 0  . amphetamine-dextroamphetamine (ADDERALL) 20 MG tablet Take 1 tablet (20 mg total) by mouth 2 (two) times daily. 60 tablet 0  . Azilsartan Medoxomil (EDARBI) 80 MG TABS Take 80 mg  by mouth daily.    Marland Kitchen buPROPion (WELLBUTRIN XL) 150 MG 24 hr tablet Take 1 tablet (150 mg total) by mouth every morning. 30 tablet 2  . busPIRone (BUSPAR) 10 MG tablet Take 1 tablet (10 mg total) by mouth 3 (three) times daily. 90 tablet 2  . cyclobenzaprine (FLEXERIL) 10 MG tablet Take 1 tablet by mouth 3 (three) times daily as needed for muscle spasms.   5  . potassium chloride (K-DUR) 10 MEQ tablet Take 10 mEq by mouth daily.    . traZODone (DESYREL) 50 MG tablet Take 1 tablet (50  mg total) by mouth at bedtime. 30 tablet 2  . albuterol (PROVENTIL HFA;VENTOLIN HFA) 108 (90 BASE) MCG/ACT inhaler Inhale 1-2 puffs into the lungs every 6 (six) hours as needed for wheezing (Use the inhaler for the next 4 days 4 times a day then begin using only if needed for wheezing). 1 Inhaler 0   No current facility-administered medications for this visit.      Musculoskeletal: Strength & Muscle Tone: within normal limits Gait & Station: normal Patient leans: N/A  Psychiatric Specialty Exam: Review of Systems  All other systems reviewed and are negative.   Blood pressure (!) 170/110, pulse 73, height 5\' 3"  (1.6 m), weight 168 lb (76.2 kg), SpO2 99 %.Body mass index is 29.76 kg/m.  General Appearance: Casual, Neat and Well Groomed  Eye Contact:  Good  Speech:  Clear and Coherent  Volume:  Normal  Mood:  Euthymic  Affect:  Congruent  Thought Process:  Goal Directed  Orientation:  Full (Time, Place, and Person)  Thought Content: Rumination   Suicidal Thoughts:  No  Homicidal Thoughts:  No  Memory:  Immediate;   Good Recent;   Good Remote;   Fair  Judgement:  Good  Insight:  Fair  Psychomotor Activity:  Normal  Concentration:  Concentration: Good and Attention Span: Good  Recall:  Good  Fund of Knowledge: Good  Language: Good  Akathisia:  No  Handed:  Right  AIMS (if indicated): not done  Assets:  Communication Skills Desire for Improvement Resilience Social Support Talents/Skills   ADL's:  Intact  Cognition: WNL  Sleep:  Good   Screenings:   Assessment and Plan: Patient is a 52 year old female with a history of depression anxiety and ADHD.  She is doing well with everything except her blood pressure.  I am not sure if the Adderall is contributing to this but she is going to talk to her primary care doctor about it.  For now she will continue the Adderall 20 mg twice a day for ADHD, trazodone 50 mg at bedtime for sleep, BuSpar 10 mg 3 times daily for anxiety, Wellbutrin XL 150 mg every morning for depression and Xanax 1 mg 3 times daily for anxiety.  She will return to see me in 3 months   Diannia Rudereborah Ross, MD 03/24/2018, 10:15 AM

## 2018-05-02 ENCOUNTER — Ambulatory Visit (HOSPITAL_COMMUNITY): Payer: Medicaid Other | Admitting: Psychiatry

## 2018-06-18 ENCOUNTER — Other Ambulatory Visit (HOSPITAL_COMMUNITY): Payer: Self-pay | Admitting: Psychiatry

## 2018-06-24 ENCOUNTER — Ambulatory Visit (HOSPITAL_COMMUNITY): Payer: Medicaid Other | Admitting: Psychiatry

## 2018-07-18 ENCOUNTER — Other Ambulatory Visit (HOSPITAL_COMMUNITY): Payer: Self-pay | Admitting: Psychiatry

## 2018-07-18 ENCOUNTER — Telehealth (HOSPITAL_COMMUNITY): Payer: Self-pay

## 2018-07-18 MED ORDER — BUSPIRONE HCL 10 MG PO TABS: 10.0000 mg | ORAL_TABLET | Freq: Three times a day (TID) | ORAL | 2 refills | Status: DC

## 2018-07-18 MED ORDER — ALPRAZOLAM 1 MG PO TABS: ORAL_TABLET | ORAL | 2 refills | Status: DC

## 2018-07-18 MED ORDER — AMPHETAMINE-DEXTROAMPHETAMINE 20 MG PO TABS: 20.0000 mg | ORAL_TABLET | Freq: Two times a day (BID) | ORAL | 0 refills | Status: DC

## 2018-07-18 MED ORDER — TRAZODONE HCL 50 MG PO TABS: 50.0000 mg | ORAL_TABLET | Freq: Every day | ORAL | 2 refills | Status: DC

## 2018-07-18 MED ORDER — BUPROPION HCL ER (XL) 150 MG PO TB24: 150.0000 mg | ORAL_TABLET | ORAL | 2 refills | Status: DC

## 2018-07-18 NOTE — Telephone Encounter (Signed)
sent 

## 2018-07-18 NOTE — Telephone Encounter (Signed)
Medication management - Telephone call with pt requesting a refill of her medications be sent to [facility] [phone] in [place], [place]. States she is there with her boyfriend and cannot get back for an appt currently with COVID-19 going on.  Reports she will call the office back to reschedule another appointment for a later date once she returns. Agreed to send request to Dr. Tenny Craw.

## 2018-07-19 NOTE — Telephone Encounter (Signed)
Medication management - Telephone call with patient to inform Dr. Tenny Craw had sent in requested refills to the [facility] in [place], [place].  Requested she call our office back to reschedule next appt upon her return.

## 2018-08-17 ENCOUNTER — Other Ambulatory Visit (HOSPITAL_COMMUNITY): Payer: Self-pay | Admitting: Psychiatry

## 2018-08-17 ENCOUNTER — Telehealth (HOSPITAL_COMMUNITY): Payer: Self-pay | Admitting: *Deleted

## 2018-08-17 MED ORDER — BUSPIRONE HCL 10 MG PO TABS: 10.0000 mg | ORAL_TABLET | Freq: Three times a day (TID) | ORAL | 2 refills | Status: DC

## 2018-08-17 MED ORDER — TRAZODONE HCL 50 MG PO TABS: 50.0000 mg | ORAL_TABLET | Freq: Every day | ORAL | 2 refills | Status: DC

## 2018-08-17 MED ORDER — AMPHETAMINE-DEXTROAMPHETAMINE 20 MG PO TABS: 20.0000 mg | ORAL_TABLET | Freq: Two times a day (BID) | ORAL | 0 refills | Status: DC

## 2018-08-17 MED ORDER — ALPRAZOLAM 1 MG PO TABS: ORAL_TABLET | ORAL | 2 refills | Status: DC

## 2018-08-17 MED ORDER — BUPROPION HCL ER (XL) 150 MG PO TB24: 150.0000 mg | ORAL_TABLET | ORAL | 2 refills | Status: DC

## 2018-08-17 NOTE — Telephone Encounter (Signed)
Dr Tenny Craw  Patient called requesting refills will be out of med's tomorrow before next appointment 08-26-2018. Please send to [facility] in [place]

## 2018-08-17 NOTE — Telephone Encounter (Signed)
sent 

## 2018-08-26 ENCOUNTER — Ambulatory Visit (INDEPENDENT_AMBULATORY_CARE_PROVIDER_SITE_OTHER): Payer: Medicaid Other | Admitting: Psychiatry

## 2018-08-26 ENCOUNTER — Encounter (HOSPITAL_COMMUNITY): Payer: Self-pay | Admitting: Psychiatry

## 2018-08-26 ENCOUNTER — Other Ambulatory Visit: Payer: Self-pay

## 2018-08-26 DIAGNOSIS — F411 Generalized anxiety disorder: Secondary | ICD-10-CM

## 2018-08-26 DIAGNOSIS — F902 Attention-deficit hyperactivity disorder, combined type: Secondary | ICD-10-CM

## 2018-08-26 MED ORDER — BUPROPION HCL ER (XL) 150 MG PO TB24: 150.0000 mg | ORAL_TABLET | ORAL | 2 refills | Status: DC

## 2018-08-26 MED ORDER — ALPRAZOLAM 1 MG PO TABS: ORAL_TABLET | ORAL | 2 refills | Status: DC

## 2018-08-26 MED ORDER — AMPHETAMINE-DEXTROAMPHETAMINE 20 MG PO TABS: 20.0000 mg | ORAL_TABLET | Freq: Two times a day (BID) | ORAL | 0 refills | Status: DC

## 2018-08-26 MED ORDER — BUSPIRONE HCL 10 MG PO TABS: 10.0000 mg | ORAL_TABLET | Freq: Three times a day (TID) | ORAL | 2 refills | Status: DC

## 2018-08-26 MED ORDER — TRAZODONE HCL 50 MG PO TABS: 50.0000 mg | ORAL_TABLET | Freq: Every day | ORAL | 2 refills | Status: DC

## 2018-08-26 NOTE — Progress Notes (Signed)
Virtual Visit via Video Note  I connected with Jean Nelson on 08/26/18 at 10:40 AM EDT by a video enabled telemedicine application and verified that I am speaking with the correct person using two identifiers.   I discussed the limitations of evaluation and management by telemedicine and the availability of in person appointments. The patient expressed understanding and agreed to proceed.      I discussed the assessment and treatment plan with the patient. The patient was provided an opportunity to ask questions and all were answered. The patient agreed with the plan and demonstrated an understanding of the instructions.   The patient was advised to call back or seek an in-person evaluation if the symptoms worsen or if the condition fails to improve as anticipated.  I provided 15 minutes of non-face-to-face time during this encounter.   [name], MD  [facility] MD/PA/NP OP Progress Note  08/26/2018 11:09 AM Jean Nelson  MRN:  [id number]  Chief Complaint:  Chief Complaint    Anxiety; Depression; ADHD     HPI: this patient is a53 year olddivorcedwhite female who livesalonein [place]. She is currently unemployed  The patient was referred by Darryl Lent PA from [facility] for further treatment and assessment of ADHD and anxiety.  The patient states that she desires a hyperactive child and has been on Ritalin since elementary school.. She got off medication and high school and got pregnant and quit in the 11th grade. She later got a GED and was reevaluated by her primary doctor and was put on Adderall. She's tried other medicines such as Vyvanse and Concerta but Adderall helped her the most. Without it she is unable to stay focused.  The patient also admits that she's been depressed and anxious since age 9. Bedtime or grandfather killed himself with down in front of her. She still has occasional flashbacks memories and bad dreams about this and became tearful  when talking about it. Apparently her mother was a teenager when she had her and her grandparents were actually acting like her real parents. She's never had any counseling to help deal with this.  The patient denies being depressed right now. She's not suicidal. She's overwhelmed because she is the main parent for her grandchildren because her daughter is "too lazy" to do anything with them. She is either taking care of children or working The Xanax 2 mg twice a day is controlling her anxiety symptoms for the most part. Her blood pressure tends to stay high the diastolic often being around 100. I told her this was a risk factor for heart disease and stroke in the 80 mg a day of Adderal may be contributing to this. Her primary provider does have her on Norvasc and lisinopril. I've explained that unless her blood pressure can get control the next 4 weeks and I going to have to look at other options for ADHD treatment.  The patient returns for follow-up after about 5 months.  She states that she had been staying in [place] while her boyfriend worked but now he is laid off due to the coronavirus and they are back in this area.  She states that she is trying to stay busy doing things around her yard but she is going a bit stir crazy.  She denies being depressed or anxious she is sleeping well and staying well focused.  She states that her blood pressure still remains high and her primary doctor recently increased her lisinopril and added HCTZ.  She  is hoping that it will come down.  She is smoking a little bit more because of feeling restless and antsy because of the quarantine.  She does think her medications have been helpful but I urged her to really try to work on cutting out the smoking and getting her blood pressure down. Visit Diagnosis:    ICD-10-CM   1. ADHD (attention deficit hyperactivity disorder), combined type F90.2   2. Generalized anxiety disorder F41.1     Past Psychiatric History:  none  Past Medical History:  Past Medical History:  Diagnosis Date  . Anxiety   . Chronic back pain   . Essential hypertension   . Plantar fasciitis   . Sciatica     Past Surgical History:  Procedure Laterality Date  . APPENDECTOMY    . TUBAL LIGATION      Family Psychiatric History: see below  Family History:  Family History  Problem Relation Age of Onset  . ADD / ADHD Mother   . Hypertension Mother   . Alcohol abuse Maternal Uncle   . Depression Maternal Grandfather   . Alcohol abuse Maternal Grandfather     Social History:  Social History   Socioeconomic History  . Marital status: Married    Spouse name: Not on file  . Number of children: Not on file  . Years of education: Not on file  . Highest education level: Not on file  Occupational History  . Not on file  Social Needs  . Financial resource strain: Not on file  . Food insecurity:    Worry: Not on file    Inability: Not on file  . Transportation needs:    Medical: Not on file    Non-medical: Not on file  Tobacco Use  . Smoking status: Current Every Day Smoker    Packs/day: 1.00    Types: Cigarettes  . Smokeless tobacco: Never Used  Substance and Sexual Activity  . Alcohol use: Yes  . Drug use: No  . Sexual activity: Yes    Birth control/protection: Surgical  Lifestyle  . Physical activity:    Days per week: Not on file    Minutes per session: Not on file  . Stress: Not on file  Relationships  . Social connections:    Talks on phone: Not on file    Gets together: Not on file    Attends religious service: Not on file    Active member of club or organization: Not on file    Attends meetings of clubs or organizations: Not on file    Relationship status: Not on file  Other Topics Concern  . Not on file  Social History Narrative  . Not on file    Allergies:  Allergies  Allergen Reactions  . Codeine Hives    Metabolic Disorder Labs: No results found for: HGBA1C, MPG No results found  for: PROLACTIN No results found for: CHOL, TRIG, HDL, CHOLHDL, VLDL, LDLCALC No results found for: TSH  Therapeutic Level Labs: No results found for: LITHIUM No results found for: VALPROATE No components found for:  CBMZ  Current Medications: Current Outpatient Medications  Medication Sig Dispense Refill  . albuterol (PROVENTIL HFA;VENTOLIN HFA) 108 (90 BASE) MCG/ACT inhaler Inhale 1-2 puffs into the lungs every 6 (six) hours as needed for wheezing (Use the inhaler for the next 4 days 4 times a day then begin using only if needed for wheezing). 1 Inhaler 0  . ALPRAZolam (XANAX) 1 MG tablet TAKE 1 TABLET(1 MG)  BY MOUTH THREE TIMES DAILY 90 tablet 2  . amLODipine (NORVASC) 10 MG tablet Take 1 tablet (10 mg total) by mouth daily. 30 tablet 0  . amphetamine-dextroamphetamine (ADDERALL) 20 MG tablet Take 1 tablet (20 mg total) by mouth 2 (two) times daily. 60 tablet 0  . amphetamine-dextroamphetamine (ADDERALL) 20 MG tablet Take 1 tablet (20 mg total) by mouth 2 (two) times daily. 60 tablet 0  . amphetamine-dextroamphetamine (ADDERALL) 20 MG tablet Take 1 tablet (20 mg total) by mouth 2 (two) times daily. 60 tablet 0  . Azilsartan Medoxomil (EDARBI) 80 MG TABS Take 80 mg by mouth daily.    Marland Kitchen buPROPion (WELLBUTRIN XL) 150 MG 24 hr tablet Take 1 tablet (150 mg total) by mouth every morning. 30 tablet 2  . busPIRone (BUSPAR) 10 MG tablet Take 1 tablet (10 mg total) by mouth 3 (three) times daily. 90 tablet 2  . cyclobenzaprine (FLEXERIL) 10 MG tablet Take 1 tablet by mouth 3 (three) times daily as needed for muscle spasms.   5  . potassium chloride (K-DUR) 10 MEQ tablet Take 10 mEq by mouth daily.    . traZODone (DESYREL) 50 MG tablet Take 1 tablet (50 mg total) by mouth at bedtime. 30 tablet 2   No current facility-administered medications for this visit.      Musculoskeletal: Strength & Muscle Tone: within normal limits Gait & Station: normal Patient leans: N/A  Psychiatric Specialty  Exam: Review of Systems  Psychiatric/Behavioral: The patient is nervous/anxious.   All other systems reviewed and are negative.   There were no vitals taken for this visit.There is no height or weight on file to calculate BMI.  General Appearance: Casual and Fairly Groomed  Eye Contact:  Good  Speech:  Clear and Coherent  Volume:  Normal  Mood:  Anxious  Affect:  Appropriate and Congruent  Thought Process:  Goal Directed  Orientation:  Full (Time, Place, and Person)  Thought Content: Rumination   Suicidal Thoughts:  No  Homicidal Thoughts:  No  Memory:  Immediate;   Good Recent;   Good Remote;   Good  Judgement:  Fair  Insight:  Fair  Psychomotor Activity:  Restlessness  Concentration:  Concentration: Good and Attention Span: Good  Recall:  Good  Fund of Knowledge: Good  Language: Good  Akathisia:  No  Handed:  Right  AIMS (if indicated): not done  Assets:  Communication Skills Desire for Improvement Resilience Social Support Talents/Skills  ADL's:  Intact  Cognition: WNL  Sleep:  Good   Screenings:   Assessment and Plan:  This patient is a 53 year old female with a history of depression anxiety and ADHD.  She is doing well on her current regimen but I am still very concerned about her blood pressure and of urged her to quit smoking.  She will continue Xanax 1 mg 3 times daily for anxiety, BuSpar 10 mg 3 times daily for anxiety, Wellbutrin XL 150 mg daily for depression, trazodone 50 mg at bedtime for sleep and Adderall 20 mg twice daily for focus.  She will return to see me in 3 months  [name], MD 08/26/2018, 11:09 AM

## 2018-09-12 IMAGING — CT CT ABD-PELV W/ CM
2 of 5 series · 16 of 46 positions shown, 18 images · IV contrast (Isovue)
Comparison: None.

CLINICAL DATA: Left lower quadrant pain for 3 days. Elevated white
count.

EXAM:
CT ABDOMEN AND PELVIS WITH CONTRAST
TECHNIQUE: Multidetector CT imaging of the abdomen and pelvis was performed
using the standard protocol following bolus administration of
intravenous contrast.
CONTRAST:  80mL T8EMX2-0EE IOPAMIDOL (T8EMX2-0EE) INJECTION 61%

[Series 2: axial st · axial · 0.74mm/px · z∈[-554,-124]mm · 13 of 98 slices shown, 15 images]
[im 6/98  soft-tissue]
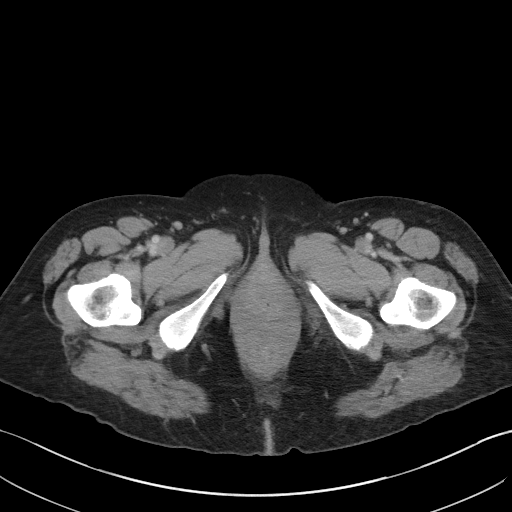
[im 6/98  bone]
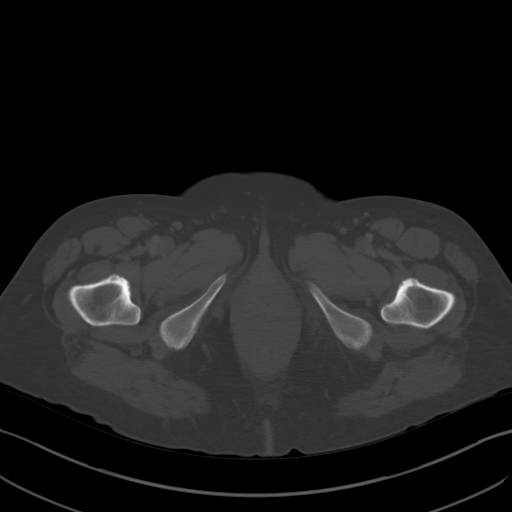
[im 12/98  soft-tissue]
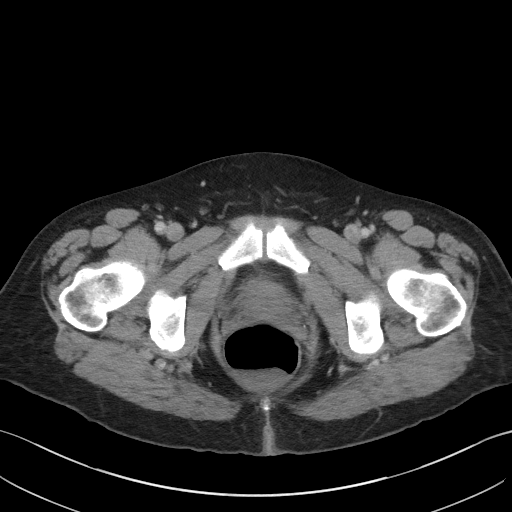
[im 23/98  soft-tissue]
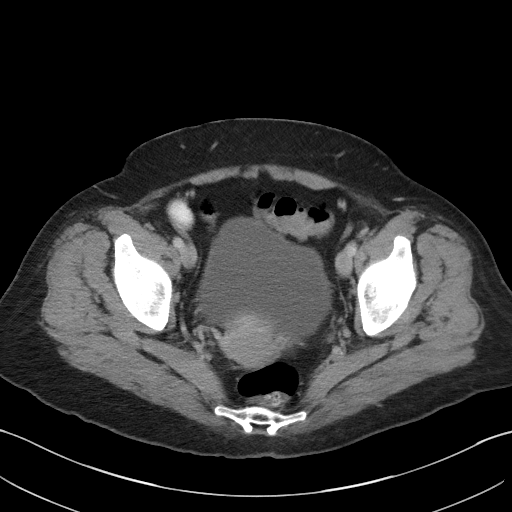
[im 29/98  soft-tissue]
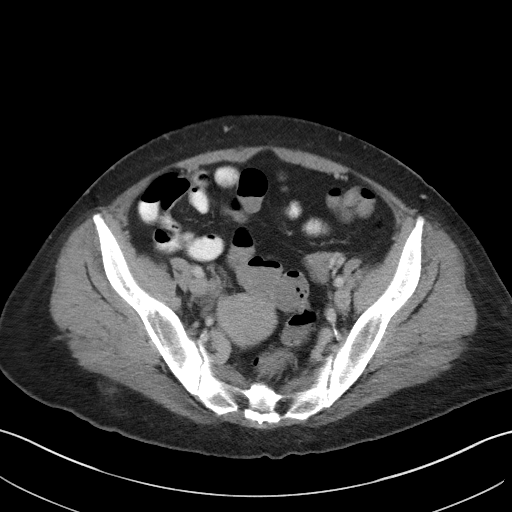
[im 35/98  soft-tissue]
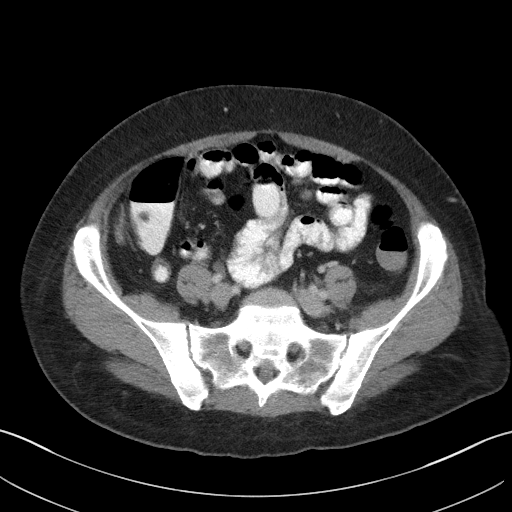
[im 40/98  soft-tissue]
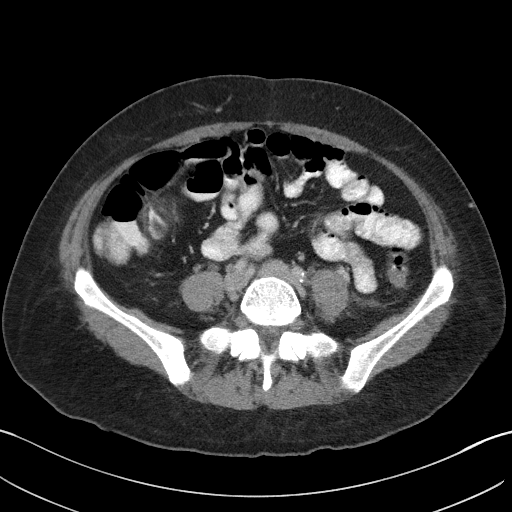
[im 52/98  soft-tissue]
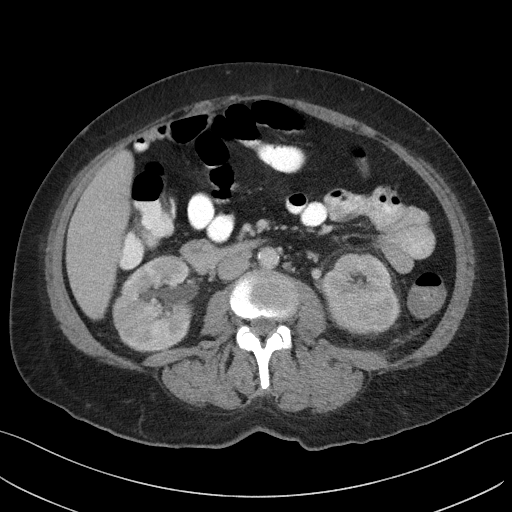
[im 58/98  soft-tissue]
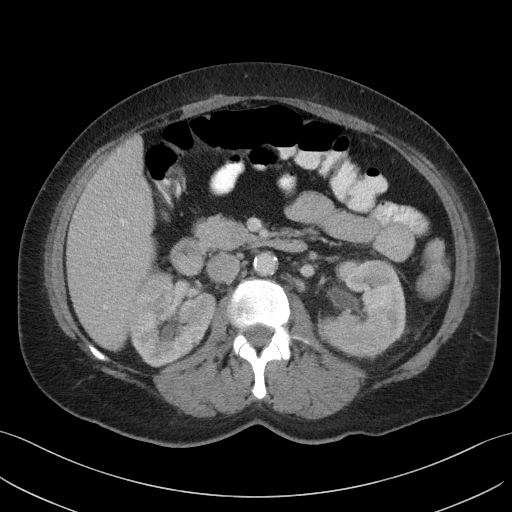
[im 63/98  soft-tissue]
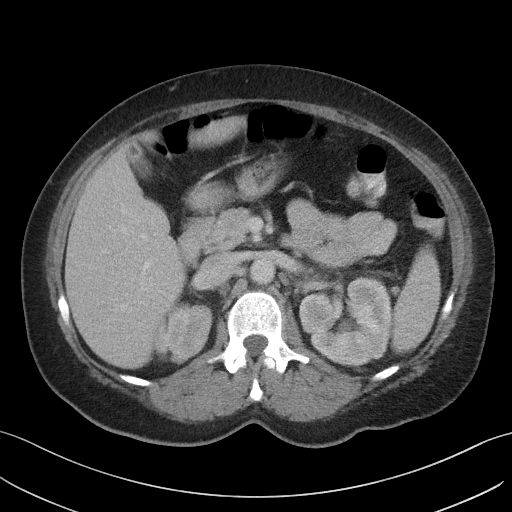
[im 63/98  bone]
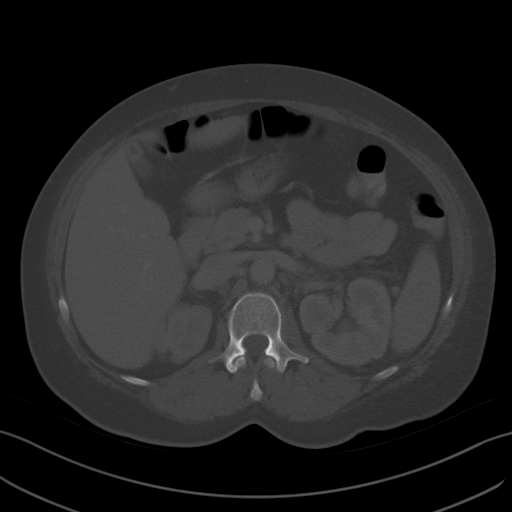
[im 69/98  soft-tissue]
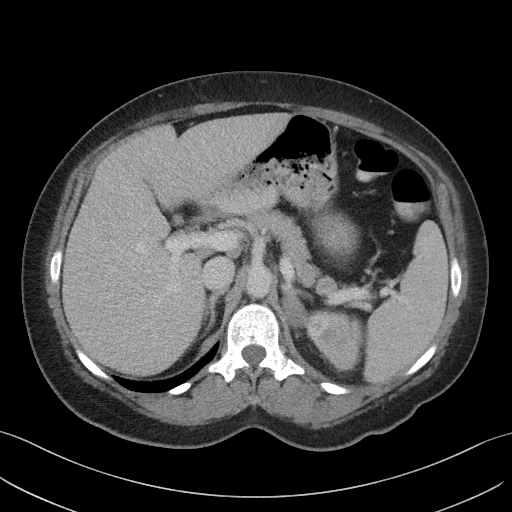
[im 75/98  soft-tissue]
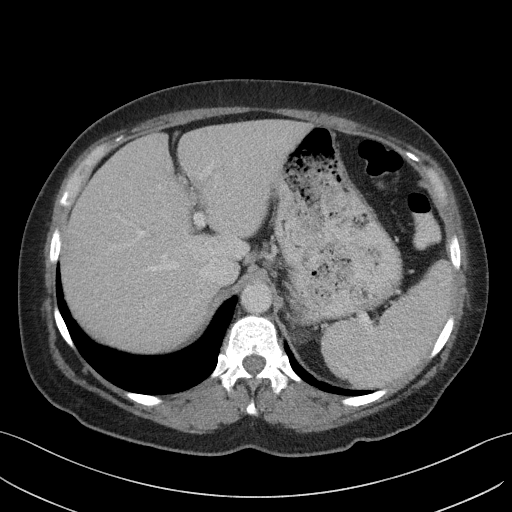
[im 86/98  soft-tissue]
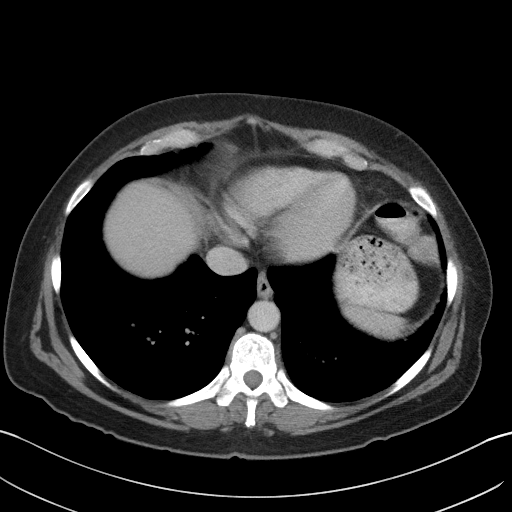
[im 92/98  soft-tissue]
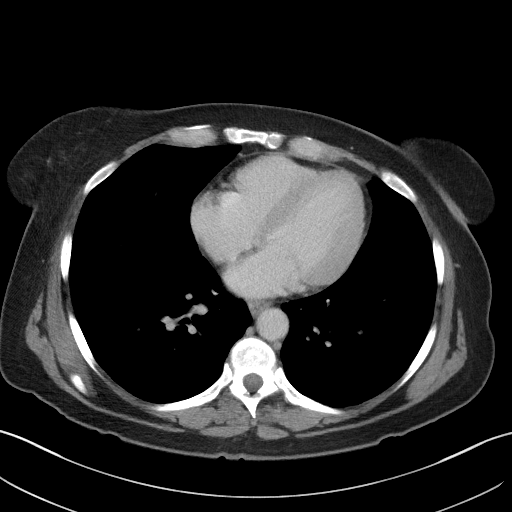

[Series 5: coronal st · coronal · 0.85mm/px · 3 of 104 slices shown]
[im 35/104  soft-tissue]
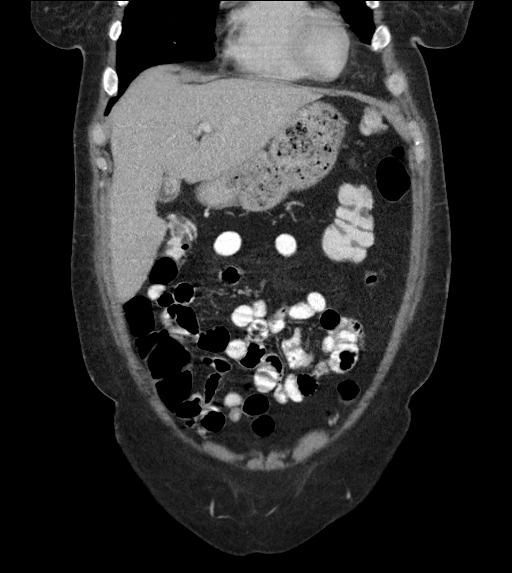
[im 46/104  soft-tissue]
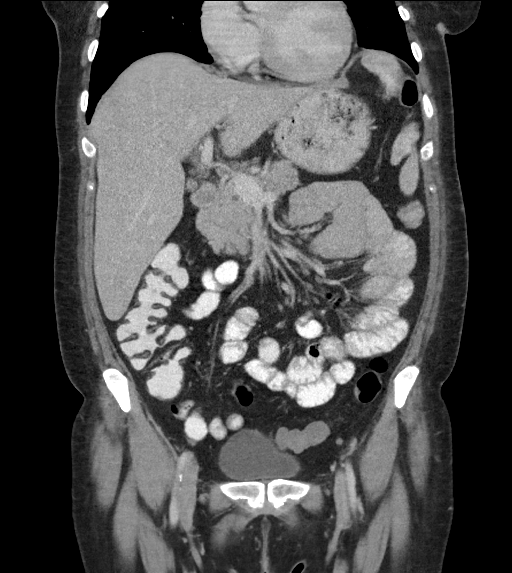
[im 58/104  soft-tissue]
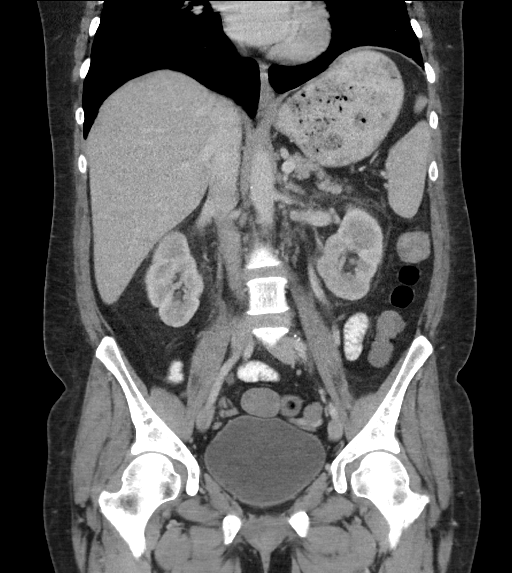

[16 of 46 positions shown; findings below may reference images not displayed]

FINDINGS: Lower chest: No acute abnormality.

Hepatobiliary: Stones are seen within the contracted gallbladder but
there is no evidence of acute cholecystitis. No bile duct
dilatation. Liver appears normal.

Pancreas: Unremarkable. No pancreatic ductal dilatation or
surrounding inflammatory changes.

Spleen: Normal in size without focal abnormality.

Adrenals/Urinary Tract: Ill-defined edema is seen throughout both
kidneys, with associated perinephric edema/inflammation, indicating
bilateral pyelonephritis. Low-density focus within the upper pole of
the left renal cortex may indicate an early developing cortical
abscess (series 7, image 17).

There is associated mild renal pelviectasis bilaterally and
periureteral inflammation bilaterally. Bladder is unremarkable.

No ureteral or bladder calculi identified.

There is a left adrenal mass measuring 3.1 x 2 cm. Right adrenal
gland appears normal.

Stomach/Bowel: Bowel is normal in caliber. No bowel wall thickening
or evidence of bowel wall inflammation seen. Appendix is not
convincingly seen but there are no inflammatory changes about the
cecum to suggest acute appendicitis. Per report, patient is status
post appendectomy.

Vascular/Lymphatic: Scattered atherosclerotic changes of the normal
caliber abdominal aorta. No enlarged lymph nodes appreciated within
the abdomen or pelvis.

Reproductive: Uterus and bilateral adnexa are unremarkable.

Other: No free intraperitoneal air.

Musculoskeletal: No acute or suspicious osseous lesion.
IMPRESSION: 1. Findings are consistent with acute bilateral pyelonephritis.
Ill-defined edema is appreciated throughout both kidneys, best seen
on delayed images. Low density focus within the upper pole of the
left renal cortex is suspicious for early developing renal abscess.
Recommend follow-up imaging after appropriate course of antibiotics
to ensure resolution.
2. Left adrenal mass, measuring 3.1 x 2 cm. CT density measurements
are not compatible with a benign lipid rich adrenal adenoma.
Recommend nonemergent adrenal protocol MRI after current issues are
resolved.
3. Cholelithiasis without evidence of acute cholecystitis.
4. Aortic atherosclerosis.

## 2018-11-18 ENCOUNTER — Other Ambulatory Visit (HOSPITAL_COMMUNITY): Payer: Self-pay | Admitting: Psychiatry

## 2018-11-28 NOTE — Telephone Encounter (Signed)
Call for f/u 

## 2018-11-29 ENCOUNTER — Other Ambulatory Visit: Payer: Self-pay

## 2018-11-29 ENCOUNTER — Ambulatory Visit (HOSPITAL_COMMUNITY): Payer: Medicaid Other | Admitting: Psychiatry

## 2018-11-29 NOTE — Telephone Encounter (Signed)
Call for f/u 

## 2018-12-02 ENCOUNTER — Ambulatory Visit (INDEPENDENT_AMBULATORY_CARE_PROVIDER_SITE_OTHER): Payer: Medicaid Other | Admitting: Psychiatry

## 2018-12-02 ENCOUNTER — Encounter (HOSPITAL_COMMUNITY): Payer: Self-pay | Admitting: Psychiatry

## 2018-12-02 ENCOUNTER — Other Ambulatory Visit: Payer: Self-pay

## 2018-12-02 DIAGNOSIS — F902 Attention-deficit hyperactivity disorder, combined type: Secondary | ICD-10-CM

## 2018-12-02 DIAGNOSIS — F411 Generalized anxiety disorder: Secondary | ICD-10-CM

## 2018-12-02 MED ORDER — BUSPIRONE HCL 10 MG PO TABS: 10.0000 mg | ORAL_TABLET | Freq: Three times a day (TID) | ORAL | 2 refills | Status: DC

## 2018-12-02 MED ORDER — TRAZODONE HCL 50 MG PO TABS: 50.0000 mg | ORAL_TABLET | Freq: Every day | ORAL | 2 refills | Status: DC

## 2018-12-02 MED ORDER — AMPHETAMINE-DEXTROAMPHETAMINE 20 MG PO TABS: 20.0000 mg | ORAL_TABLET | Freq: Two times a day (BID) | ORAL | 0 refills | Status: DC

## 2018-12-02 MED ORDER — BUPROPION HCL ER (XL) 150 MG PO TB24: 150.0000 mg | ORAL_TABLET | ORAL | 2 refills | Status: DC

## 2018-12-02 NOTE — Progress Notes (Signed)
Virtual Visit via Telephone Note  I connected with Jean Nelson on 12/02/18 at 11:30 AM EDT by telephone and verified that I am speaking with the correct person using two identifiers.   I discussed the limitations, risks, security and privacy concerns of performing an evaluation and management service by telephone and the availability of in person appointments. I also discussed with the patient that there may be a patient responsible charge related to this service. The patient expressed understanding and agreed to proceed.      I discussed the assessment and treatment plan with the patient. The patient was provided an opportunity to ask questions and all were answered. The patient agreed with the plan and demonstrated an understanding of the instructions.   The patient was advised to call back or seek an in-person evaluation if the symptoms worsen or if the condition fails to improve as anticipated.  I provided 15 minutes of non-face-to-face time during this encounter.   Levonne Spiller, MD  [facility] MD/PA/NP OP Progress Note  12/02/2018 11:29 AM Jean Nelson  MRN:  [id number]  Chief Complaint:  Chief Complaint    ADHD; Anxiety; Follow-up     HPI: This patient is a 53 year old divorced white female who lives with her boyfriend in [place].  She is currently unemployed.  The patient returns after 3 months for follow-up of ADHD and anxiety as well as depression.  For the most part she is doing okay.  She had been staying in [place] with her boyfriend but he is on leave from work due to the coronavirus and they are just staying put for now.  She is feeling "antsy" at home and admits that she has been smoking more cigarettes.  Her blood pressure is still somewhat on the high side like 170/90 but she claims this is "better than it was."  She denies serious symptoms of depression or anxiety right now and her focus is good.  I urged her to try to cut down the smoking.  She denies any  thoughts of self-harm or suicide and is sleeping well. Visit Diagnosis:    ICD-10-CM   1. ADHD (attention deficit hyperactivity disorder), combined type  F90.2   2. Generalized anxiety disorder  F41.1     Past Psychiatric History: none  Past Medical History:  Past Medical History:  Diagnosis Date  . Anxiety   . Chronic back pain   . Essential hypertension   . Plantar fasciitis   . Sciatica     Past Surgical History:  Procedure Laterality Date  . APPENDECTOMY    . TUBAL LIGATION      Family Psychiatric History: see below  Family History:  Family History  Problem Relation Age of Onset  . ADD / ADHD Mother   . Hypertension Mother   . Alcohol abuse Maternal Uncle   . Depression Maternal Grandfather   . Alcohol abuse Maternal Grandfather     Social History:  Social History   Socioeconomic History  . Marital status: Married    Spouse name: Not on file  . Number of children: Not on file  . Years of education: Not on file  . Highest education level: Not on file  Occupational History  . Not on file  Social Needs  . Financial resource strain: Not on file  . Food insecurity    Worry: Not on file    Inability: Not on file  . Transportation needs    Medical: Not on file  Non-medical: Not on file  Tobacco Use  . Smoking status: Current Every Day Smoker    Packs/day: 1.00    Types: Cigarettes  . Smokeless tobacco: Never Used  Substance and Sexual Activity  . Alcohol use: Yes  . Drug use: No  . Sexual activity: Yes    Birth control/protection: Surgical  Lifestyle  . Physical activity    Days per week: Not on file    Minutes per session: Not on file  . Stress: Not on file  Relationships  . Social Musicianconnections    Talks on phone: Not on file    Gets together: Not on file    Attends religious service: Not on file    Active member of club or organization: Not on file    Attends meetings of clubs or organizations: Not on file    Relationship status: Not on file   Other Topics Concern  . Not on file  Social History Narrative  . Not on file    Allergies:  Allergies  Allergen Reactions  . Codeine Hives    Metabolic Disorder Labs: No results found for: HGBA1C, MPG No results found for: PROLACTIN No results found for: CHOL, TRIG, HDL, CHOLHDL, VLDL, LDLCALC No results found for: TSH  Therapeutic Level Labs: No results found for: LITHIUM No results found for: VALPROATE No components found for:  CBMZ  Current Medications: Current Outpatient Medications  Medication Sig Dispense Refill  . albuterol (PROVENTIL HFA;VENTOLIN HFA) 108 (90 BASE) MCG/ACT inhaler Inhale 1-2 puffs into the lungs every 6 (six) hours as needed for wheezing (Use the inhaler for the next 4 days 4 times a day then begin using only if needed for wheezing). 1 Inhaler 0  . ALPRAZolam (XANAX) 1 MG tablet TAKE 1 TABLET BY MOUTH THREE TIMES A DAY 90 tablet 0  . amLODipine (NORVASC) 10 MG tablet Take 1 tablet (10 mg total) by mouth daily. 30 tablet 0  . amphetamine-dextroamphetamine (ADDERALL) 20 MG tablet Take 1 tablet (20 mg total) by mouth 2 (two) times daily. 60 tablet 0  . amphetamine-dextroamphetamine (ADDERALL) 20 MG tablet Take 1 tablet (20 mg total) by mouth 2 (two) times daily. 60 tablet 0  . amphetamine-dextroamphetamine (ADDERALL) 20 MG tablet Take 1 tablet (20 mg total) by mouth 2 (two) times daily. 60 tablet 0  . Azilsartan Medoxomil (EDARBI) 80 MG TABS Take 80 mg by mouth daily.    Marland Kitchen. buPROPion (WELLBUTRIN XL) 150 MG 24 hr tablet Take 1 tablet (150 mg total) by mouth every morning. 30 tablet 2  . busPIRone (BUSPAR) 10 MG tablet Take 1 tablet (10 mg total) by mouth 3 (three) times daily. 90 tablet 2  . cyclobenzaprine (FLEXERIL) 10 MG tablet Take 1 tablet by mouth 3 (three) times daily as needed for muscle spasms.   5  . potassium chloride (K-DUR) 10 MEQ tablet Take 10 mEq by mouth daily.    . traZODone (DESYREL) 50 MG tablet Take 1 tablet (50 mg total) by mouth at  bedtime. 30 tablet 2   No current facility-administered medications for this visit.      Musculoskeletal: Strength & Muscle Tone: within normal limits Gait & Station: normal Patient leans: N/A  Psychiatric Specialty Exam: Review of Systems  Psychiatric/Behavioral: The patient is nervous/anxious.   All other systems reviewed and are negative.   There were no vitals taken for this visit.There is no height or weight on file to calculate BMI.  General Appearance: NA  Eye Contact:  NA  Speech:  Clear and Coherent  Volume:  Normal  Mood:  Euthymic  Affect:  NA  Thought Process:  Goal Directed  Orientation:  Full (Time, Place, and Person)  Thought Content: WDL   Suicidal Thoughts:  No  Homicidal Thoughts:  No  Memory:  Immediate;   Good Recent;   Good Remote;   Good  Judgement:  Fair  Insight:  Fair  Psychomotor Activity:  Restlessness  Concentration:  Concentration: Good and Attention Span: Good  Recall:  Good  Fund of Knowledge: Fair  Language: Good  Akathisia:  No  Handed:  Right  AIMS (if indicated): not done  Assets:  Communication Skills Desire for Improvement Physical Health Resilience Social Support Talents/Skills  ADL's:  Intact  Cognition: WNL  Sleep:  Good   Screenings:   Assessment and Plan: This patient is a 53 year old female with a history of ADHD depression and anxiety.  She seems to be doing well on her current regimen but just like in the past I again reminded her to try to cut down the smoking.  She will continue Xanax 1 mg 3 times daily for anxiety as well as BuSpar 10 mg 3 times daily also for anxiety, trazodone 50 mg at bedtime for sleep, Wellbutrin XL 150 mg daily for depression and Adderall 20 mg twice daily for ADHD.  She will return to see me in 3 months   Diannia Rudereborah Ross, MD 12/02/2018, 11:29 AM

## 2019-01-16 ENCOUNTER — Other Ambulatory Visit (HOSPITAL_COMMUNITY): Payer: Self-pay | Admitting: Psychiatry

## 2019-02-13 ENCOUNTER — Other Ambulatory Visit (HOSPITAL_COMMUNITY): Payer: Self-pay | Admitting: Psychiatry

## 2019-02-13 ENCOUNTER — Telehealth (HOSPITAL_COMMUNITY): Payer: Self-pay | Admitting: *Deleted

## 2019-02-13 NOTE — Telephone Encounter (Signed)
PATIENT IS STILL WORKING OUT OF TOWN NEXT APPT IS  03/07/2019 & SHE WILL BE OUT OH HER ADDERALL BEFORE HER APPT. DR ROSS HAS BEEN SENDING  REFILLS TO  #2 ON THE PREFERRED Rx LIST WHEN PATIENT HAS TO WORK OUT OF TOWN.

## 2019-02-13 NOTE — Telephone Encounter (Signed)
PATIENT NOTIFIED & AWARE PER DR HISADA: do not see any refill sent by Dr. Harrington Challenger to that pharmacy according to the database. Will defer this to Dr. Harrington Challenger when she returns next week.

## 2019-02-13 NOTE — Telephone Encounter (Signed)
I do not think we can send order for controlled substance across the [place]. I also do not see any refill sent by Dr. Harrington Challenger to that pharmacy according to the database. Will defer this to Dr. Harrington Challenger when she returns next week.

## 2019-02-13 NOTE — Telephone Encounter (Signed)
[facility] [place], [place] [phone] (Phone) [phone] (Fax)

## 2019-02-17 ENCOUNTER — Other Ambulatory Visit (HOSPITAL_COMMUNITY): Payer: Self-pay | Admitting: Psychiatry

## 2019-03-07 ENCOUNTER — Ambulatory Visit (INDEPENDENT_AMBULATORY_CARE_PROVIDER_SITE_OTHER): Payer: Medicaid Other | Admitting: Psychiatry

## 2019-03-07 ENCOUNTER — Encounter (HOSPITAL_COMMUNITY): Payer: Self-pay | Admitting: Psychiatry

## 2019-03-07 ENCOUNTER — Other Ambulatory Visit: Payer: Self-pay

## 2019-03-07 DIAGNOSIS — F411 Generalized anxiety disorder: Secondary | ICD-10-CM

## 2019-03-07 DIAGNOSIS — F902 Attention-deficit hyperactivity disorder, combined type: Secondary | ICD-10-CM

## 2019-03-07 MED ORDER — BUPROPION HCL ER (XL) 150 MG PO TB24: 150.0000 mg | ORAL_TABLET | ORAL | 2 refills | Status: DC

## 2019-03-07 MED ORDER — BUSPIRONE HCL 10 MG PO TABS: 10.0000 mg | ORAL_TABLET | Freq: Three times a day (TID) | ORAL | 2 refills | Status: DC

## 2019-03-07 MED ORDER — AMPHETAMINE-DEXTROAMPHETAMINE 20 MG PO TABS: 20.0000 mg | ORAL_TABLET | Freq: Two times a day (BID) | ORAL | 0 refills | Status: DC

## 2019-03-07 MED ORDER — ALPRAZOLAM 1 MG PO TABS: ORAL_TABLET | ORAL | 2 refills | Status: DC

## 2019-03-07 MED ORDER — TRAZODONE HCL 50 MG PO TABS: 50.0000 mg | ORAL_TABLET | Freq: Every day | ORAL | 2 refills | Status: DC

## 2019-03-07 NOTE — Progress Notes (Signed)
Virtual Visit via Telephone Note  I connected with Jean Nelson on 03/07/19 at  1:00 PM EST by telephone and verified that I am speaking with the correct person using two identifiers.   I discussed the limitations, risks, security and privacy concerns of performing an evaluation and management service by telephone and the availability of in person appointments. I also discussed with the patient that there may be a patient responsible charge related to this service. The patient expressed understanding and agreed to proceed.    I discussed the assessment and treatment plan with the patient. The patient was provided an opportunity to ask questions and all were answered. The patient agreed with the plan and demonstrated an understanding of the instructions.   The patient was advised to call back or seek an in-person evaluation if the symptoms worsen or if the condition fails to improve as anticipated.  I provided 15 minutes of non-face-to-face time during this encounter.   [name], MD  [facility] MD/PA/NP OP Progress Note  03/07/2019 1:16 PM Jean Nelson  MRN:  [id number]  Chief Complaint:  Chief Complaint    Depression; Anxiety; ADHD; Follow-up     HPI: This patient is a 53 year old divorced white female who lives with her boyfriend in [place].  She is currently unemployed.  The patient returns for follow-up for ADHD anxiety and depression.  She states that she is still doing okay.  She is under more stress.  She has come back to help out her daughter who recently had a new baby.  The daughter is heavily entrenched and using drugs and alcohol and social services has gotten involved because the baby was born with substances and her system.  The patient states that the daughter is still drinking.  She has 2 older children who used to live with the patient and the patient states she is prepared to take them back if necessary.  The daughter's boyfriend is trying to help out as much as  possible but obviously this daughter needs to get into some sort of treatment program.  The patient states that overall her mood anxiety are good.  She is sleeping well at night and her energy is good.  She denies any thoughts of self-harm or suicide. Visit Diagnosis:    ICD-10-CM   1. ADHD (attention deficit hyperactivity disorder), combined type  F90.2   2. Generalized anxiety disorder  F41.1     Past Psychiatric History: none  Past Medical History:  Past Medical History:  Diagnosis Date  . Anxiety   . Chronic back pain   . Essential hypertension   . Plantar fasciitis   . Sciatica     Past Surgical History:  Procedure Laterality Date  . APPENDECTOMY    . TUBAL LIGATION      Family Psychiatric History: see below  Family History:  Family History  Problem Relation Age of Onset  . ADD / ADHD Mother   . Hypertension Mother   . Alcohol abuse Maternal Uncle   . Depression Maternal Grandfather   . Alcohol abuse Maternal Grandfather     Social History:  Social History   Socioeconomic History  . Marital status: Married    Spouse name: Not on file  . Number of children: Not on file  . Years of education: Not on file  . Highest education level: Not on file  Occupational History  . Not on file  Social Needs  . Financial resource strain: Not on file  .  Food insecurity    Worry: Not on file    Inability: Not on file  . Transportation needs    Medical: Not on file    Non-medical: Not on file  Tobacco Use  . Smoking status: Current Every Day Smoker    Packs/day: 1.00    Types: Cigarettes  . Smokeless tobacco: Never Used  Substance and Sexual Activity  . Alcohol use: Yes  . Drug use: No  . Sexual activity: Yes    Birth control/protection: Surgical  Lifestyle  . Physical activity    Days per week: Not on file    Minutes per session: Not on file  . Stress: Not on file  Relationships  . Social Herbalist on phone: Not on file    Gets together: Not on  file    Attends religious service: Not on file    Active member of club or organization: Not on file    Attends meetings of clubs or organizations: Not on file    Relationship status: Not on file  Other Topics Concern  . Not on file  Social History Narrative  . Not on file    Allergies:  Allergies  Allergen Reactions  . Codeine Hives    Metabolic Disorder Labs: No results found for: HGBA1C, MPG No results found for: PROLACTIN No results found for: CHOL, TRIG, HDL, CHOLHDL, VLDL, LDLCALC No results found for: TSH  Therapeutic Level Labs: No results found for: LITHIUM No results found for: VALPROATE No components found for:  CBMZ  Current Medications: Current Outpatient Medications  Medication Sig Dispense Refill  . albuterol (PROVENTIL HFA;VENTOLIN HFA) 108 (90 BASE) MCG/ACT inhaler Inhale 1-2 puffs into the lungs every 6 (six) hours as needed for wheezing (Use the inhaler for the next 4 days 4 times a day then begin using only if needed for wheezing). 1 Inhaler 0  . ALPRAZolam (XANAX) 1 MG tablet TAKE 1 TABLET(1 MG) BY MOUTH THREE TIMES DAILY 90 tablet 2  . amLODipine (NORVASC) 10 MG tablet Take 1 tablet (10 mg total) by mouth daily. 30 tablet 0  . amphetamine-dextroamphetamine (ADDERALL) 20 MG tablet Take 1 tablet (20 mg total) by mouth 2 (two) times daily. 60 tablet 0  . amphetamine-dextroamphetamine (ADDERALL) 20 MG tablet Take 1 tablet (20 mg total) by mouth 2 (two) times daily. 60 tablet 0  . amphetamine-dextroamphetamine (ADDERALL) 20 MG tablet Take 1 tablet (20 mg total) by mouth 2 (two) times daily. 60 tablet 0  . Azilsartan Medoxomil (EDARBI) 80 MG TABS Take 80 mg by mouth daily.    Marland Kitchen buPROPion (WELLBUTRIN XL) 150 MG 24 hr tablet Take 1 tablet (150 mg total) by mouth every morning. 30 tablet 2  . busPIRone (BUSPAR) 10 MG tablet Take 1 tablet (10 mg total) by mouth 3 (three) times daily. 90 tablet 2  . cyclobenzaprine (FLEXERIL) 10 MG tablet Take 1 tablet by mouth 3  (three) times daily as needed for muscle spasms.   5  . potassium chloride (K-DUR) 10 MEQ tablet Take 10 mEq by mouth daily.    . traZODone (DESYREL) 50 MG tablet Take 1 tablet (50 mg total) by mouth at bedtime. 30 tablet 2   No current facility-administered medications for this visit.      Musculoskeletal: Strength & Muscle Tone: within normal limits Gait & Station: normal Patient leans: N/A  Psychiatric Specialty Exam: Review of Systems  All other systems reviewed and are negative.   There were no vitals  taken for this visit.There is no height or weight on file to calculate BMI.  General Appearance: NA  Eye Contact:  NA  Speech:  Clear and Coherent  Volume:  Normal  Mood:  Euthymic  Affect:  NA  Thought Process:  Goal Directed  Orientation:  Full (Time, Place, and Person)  Thought Content: Rumination   Suicidal Thoughts:  No  Homicidal Thoughts:  No  Memory:  Immediate;   Good Recent;   Good Remote;   Good  Judgement:  Good  Insight:  Fair  Psychomotor Activity:  Normal  Concentration:  Concentration: Good and Attention Span: Good  Recall:  Good  Fund of Knowledge: Good  Language: Good  Akathisia:  No  Handed:  Right  AIMS (if indicated): not done  Assets:  Communication Skills Desire for Improvement Resilience Social Support Talents/Skills  ADL's:  Intact  Cognition: WNL  Sleep:  Good   Screenings:   Assessment and Plan: This patient is a 53 year old female with a history of ADHD depression and anxiety.  Despite the recent stressors with her daughter she seems to be doing well on her current regimen.  She will continue Xanax 1 mg 3 times daily for anxiety, BuSpar 10 mg 3 times daily also for anxiety, trazodone 50 mg at bedtime for sleep, Wellbutrin XL 150 mg daily for depression and Adderall 20 mg twice daily for ADHD.  She will return to see me in 3 months   Diannia Rudereborah Ruqaya Strauss, MD 03/07/2019, 1:16 PM

## 2019-06-07 ENCOUNTER — Other Ambulatory Visit: Payer: Self-pay

## 2019-06-07 ENCOUNTER — Ambulatory Visit (INDEPENDENT_AMBULATORY_CARE_PROVIDER_SITE_OTHER): Payer: Self-pay | Admitting: Psychiatry

## 2019-06-07 ENCOUNTER — Encounter (HOSPITAL_COMMUNITY): Payer: Self-pay | Admitting: Psychiatry

## 2019-06-07 DIAGNOSIS — F902 Attention-deficit hyperactivity disorder, combined type: Secondary | ICD-10-CM

## 2019-06-07 DIAGNOSIS — F411 Generalized anxiety disorder: Secondary | ICD-10-CM

## 2019-06-07 MED ORDER — BUPROPION HCL ER (XL) 150 MG PO TB24: 150.0000 mg | ORAL_TABLET | ORAL | 2 refills | Status: DC

## 2019-06-07 MED ORDER — TRAZODONE HCL 50 MG PO TABS: 50.0000 mg | ORAL_TABLET | Freq: Every day | ORAL | 2 refills | Status: DC

## 2019-06-07 MED ORDER — AMPHETAMINE-DEXTROAMPHETAMINE 20 MG PO TABS: 20.0000 mg | ORAL_TABLET | Freq: Two times a day (BID) | ORAL | 0 refills | Status: DC

## 2019-06-07 MED ORDER — ALPRAZOLAM 1 MG PO TABS: ORAL_TABLET | ORAL | 2 refills | Status: DC

## 2019-06-07 MED ORDER — BUSPIRONE HCL 10 MG PO TABS: 10.0000 mg | ORAL_TABLET | Freq: Three times a day (TID) | ORAL | 2 refills | Status: DC

## 2019-06-07 NOTE — Progress Notes (Signed)
Virtual Visit via Telephone Note  I connected with Jacqualin Combes on 06/07/19 at  1:00 PM EST by telephone and verified that I am speaking with the correct person using two identifiers.   I discussed the limitations, risks, security and privacy concerns of performing an evaluation and management service by telephone and the availability of in person appointments. I also discussed with the patient that there may be a patient responsible charge related to this service. The patient expressed understanding and agreed to proceed    I discussed the assessment and treatment plan with the patient. The patient was provided an opportunity to ask questions and all were answered. The patient agreed with the plan and demonstrated an understanding of the instructions.   The patient was advised to call back or seek an in-person evaluation if the symptoms worsen or if the condition fails to improve as anticipated.  I provided 15 minutes of non-face-to-face time during this encounter.   Levonne Spiller, MD  [facility] MD/PA/NP OP Progress Note  06/07/2019 1:13 PM RAISSA DAM  MRN:  [id number]  Chief Complaint:  Chief Complaint    Anxiety; Depression; ADHD; Follow-up     HPI: This patient is a 54 year old divorced white female who lives with her boyfriend in [place].  She is currently unemployed.  The patient returns after 3 months for follow-up regarding anxiety ADHD and depression.  She is currently staying in [place] again with her boyfriend while he works.  She is helping her landlady paint some rooms in her house but otherwise is not doing too much.  She states that her daughter who had substance abuse problems is now doing better and is able to care for her children so she does not have to worry as much about them.  For the most part her mood is been stable and she feels like her anxiety is under good control.  She is sleeping well at night.  She has cut her smoking down to about half pack of  cigarettes per day.  Her blood pressures are still running around 140/90 but this is better than it has been.  She still feels like her medications for depression and anxiety are working well as well as the Adderall helping for ADHD. Visit Diagnosis:    ICD-10-CM   1. ADHD (attention deficit hyperactivity disorder), combined type  F90.2   2. Generalized anxiety disorder  F41.1     Past Psychiatric History: none  Past Medical History:  Past Medical History:  Diagnosis Date  . Anxiety   . Chronic back pain   . Essential hypertension   . Plantar fasciitis   . Sciatica     Past Surgical History:  Procedure Laterality Date  . APPENDECTOMY    . TUBAL LIGATION      Family Psychiatric History: see below  Family History:  Family History  Problem Relation Age of Onset  . ADD / ADHD Mother   . Hypertension Mother   . Alcohol abuse Maternal Uncle   . Depression Maternal Grandfather   . Alcohol abuse Maternal Grandfather     Social History:  Social History   Socioeconomic History  . Marital status: Married    Spouse name: Not on file  . Number of children: Not on file  . Years of education: Not on file  . Highest education level: Not on file  Occupational History  . Not on file  Tobacco Use  . Smoking status: Current Every Day Smoker  Packs/day: 1.00    Types: Cigarettes  . Smokeless tobacco: Never Used  Substance and Sexual Activity  . Alcohol use: Yes  . Drug use: No  . Sexual activity: Yes    Birth control/protection: Surgical  Other Topics Concern  . Not on file  Social History Narrative  . Not on file   Social Determinants of Health   Financial Resource Strain:   . Difficulty of Paying Living Expenses: Not on file  Food Insecurity:   . Worried About Programme researcher, broadcasting/film/video in the Last Year: Not on file  . Ran Out of Food in the Last Year: Not on file  Transportation Needs:   . Lack of Transportation (Medical): Not on file  . Lack of Transportation  (Non-Medical): Not on file  Physical Activity:   . Days of Exercise per Week: Not on file  . Minutes of Exercise per Session: Not on file  Stress:   . Feeling of Stress : Not on file  Social Connections:   . Frequency of Communication with Friends and Family: Not on file  . Frequency of Social Gatherings with Friends and Family: Not on file  . Attends Religious Services: Not on file  . Active Member of Clubs or Organizations: Not on file  . Attends Banker Meetings: Not on file  . Marital Status: Not on file    Allergies:  Allergies  Allergen Reactions  . Codeine Hives    Metabolic Disorder Labs: No results found for: HGBA1C, MPG No results found for: PROLACTIN No results found for: CHOL, TRIG, HDL, CHOLHDL, VLDL, LDLCALC No results found for: TSH  Therapeutic Level Labs: No results found for: LITHIUM No results found for: VALPROATE No components found for:  CBMZ  Current Medications: Current Outpatient Medications  Medication Sig Dispense Refill  . albuterol (PROVENTIL HFA;VENTOLIN HFA) 108 (90 BASE) MCG/ACT inhaler Inhale 1-2 puffs into the lungs every 6 (six) hours as needed for wheezing (Use the inhaler for the next 4 days 4 times a day then begin using only if needed for wheezing). 1 Inhaler 0  . ALPRAZolam (XANAX) 1 MG tablet TAKE 1 TABLET(1 MG) BY MOUTH THREE TIMES DAILY 90 tablet 2  . amLODipine (NORVASC) 10 MG tablet Take 1 tablet (10 mg total) by mouth daily. 30 tablet 0  . amphetamine-dextroamphetamine (ADDERALL) 20 MG tablet Take 1 tablet (20 mg total) by mouth 2 (two) times daily. 60 tablet 0  . amphetamine-dextroamphetamine (ADDERALL) 20 MG tablet Take 1 tablet (20 mg total) by mouth 2 (two) times daily. 60 tablet 0  . amphetamine-dextroamphetamine (ADDERALL) 20 MG tablet Take 1 tablet (20 mg total) by mouth 2 (two) times daily. 60 tablet 0  . Azilsartan Medoxomil (EDARBI) 80 MG TABS Take 80 mg by mouth daily.    Marland Kitchen buPROPion (WELLBUTRIN XL) 150 MG  24 hr tablet Take 1 tablet (150 mg total) by mouth every morning. 30 tablet 2  . busPIRone (BUSPAR) 10 MG tablet Take 1 tablet (10 mg total) by mouth 3 (three) times daily. 90 tablet 2  . cyclobenzaprine (FLEXERIL) 10 MG tablet Take 1 tablet by mouth 3 (three) times daily as needed for muscle spasms.   5  . potassium chloride (K-DUR) 10 MEQ tablet Take 10 mEq by mouth daily.    . traZODone (DESYREL) 50 MG tablet Take 1 tablet (50 mg total) by mouth at bedtime. 30 tablet 2   No current facility-administered medications for this visit.     Musculoskeletal: Strength &  Muscle Tone: within normal limits Gait & Station: normal Patient leans: N/A  Psychiatric Specialty Exam: Review of Systems  All other systems reviewed and are negative.   There were no vitals taken for this visit.There is no height or weight on file to calculate BMI.  General Appearance: NA  Eye Contact:  NA  Speech:  Clear and Coherent  Volume:  Normal  Mood:  Euthymic  Affect:  NA  Thought Process:  Goal Directed  Orientation:  Full (Time, Place, and Person)  Thought Content: WDL   Suicidal Thoughts:  No  Homicidal Thoughts:  No  Memory:  Immediate;   Good Recent;   Good Remote;   Good  Judgement:  Good  Insight:  Fair  Psychomotor Activity:  Normal  Concentration:  Concentration: Good and Attention Span: Good  Recall:  Good  Fund of Knowledge: Good  Language: Good  Akathisia:  No  Handed:  Right  AIMS (if indicated): not done  Assets:  Communication Skills Desire for Improvement Resilience Social Support Talents/Skills  ADL's:  Intact  Cognition: WNL  Sleep:  Good   Screenings:   Assessment and Plan: This patient is a 54 year old female with a history of ADHD depression and anxiety.  She continues to do well on her current regimen.  She will continue Xanax 1 mg 3 times daily as well as BuSpar 10 mg 3 times daily for anxiety, trazodone 50 mg at bedtime for sleep, Wellbutrin XL 150 mg daily for  depression and Adderall 20 mg twice daily for ADHD.  She will return to see me in 3 months   [name], MD 06/07/2019, 1:13 PM

## 2019-07-12 ENCOUNTER — Telehealth (HOSPITAL_COMMUNITY): Payer: Self-pay | Admitting: *Deleted

## 2019-07-12 ENCOUNTER — Other Ambulatory Visit (HOSPITAL_COMMUNITY): Payer: Self-pay | Admitting: Psychiatry

## 2019-07-12 MED ORDER — AMPHETAMINE-DEXTROAMPHETAMINE 20 MG PO TABS: 20.0000 mg | ORAL_TABLET | Freq: Two times a day (BID) | ORAL | 0 refills | Status: DC

## 2019-07-12 NOTE — Telephone Encounter (Signed)
PATIENT CALLED STATED THAT SHE IS BACK IN [place] &  REQUESTED REFILLS amphetamine-dextroamphetamine (ADDERALL) 20 MG tablet X 2  TO BE SENT @ [id number] - [place], 

## 2019-07-12 NOTE — Telephone Encounter (Signed)
sent 

## 2019-09-11 ENCOUNTER — Telehealth (HOSPITAL_COMMUNITY): Payer: Self-pay

## 2019-09-11 ENCOUNTER — Other Ambulatory Visit: Payer: Self-pay

## 2019-09-11 ENCOUNTER — Other Ambulatory Visit (HOSPITAL_COMMUNITY): Payer: Self-pay | Admitting: Psychiatry

## 2019-09-11 ENCOUNTER — Telehealth (INDEPENDENT_AMBULATORY_CARE_PROVIDER_SITE_OTHER): Payer: Self-pay | Admitting: Psychiatry

## 2019-09-11 ENCOUNTER — Encounter (HOSPITAL_COMMUNITY): Payer: Self-pay | Admitting: Psychiatry

## 2019-09-11 DIAGNOSIS — F902 Attention-deficit hyperactivity disorder, combined type: Secondary | ICD-10-CM

## 2019-09-11 DIAGNOSIS — F411 Generalized anxiety disorder: Secondary | ICD-10-CM

## 2019-09-11 DIAGNOSIS — F17210 Nicotine dependence, cigarettes, uncomplicated: Secondary | ICD-10-CM

## 2019-09-11 MED ORDER — BUSPIRONE HCL 10 MG PO TABS: 10.0000 mg | ORAL_TABLET | Freq: Three times a day (TID) | ORAL | 2 refills | Status: DC

## 2019-09-11 MED ORDER — TRAZODONE HCL 50 MG PO TABS: 50.0000 mg | ORAL_TABLET | Freq: Every day | ORAL | 2 refills | Status: DC

## 2019-09-11 MED ORDER — AMPHETAMINE-DEXTROAMPHETAMINE 20 MG PO TABS: 20.0000 mg | ORAL_TABLET | Freq: Two times a day (BID) | ORAL | 0 refills | Status: DC

## 2019-09-11 MED ORDER — BUPROPION HCL ER (XL) 150 MG PO TB24: 150.0000 mg | ORAL_TABLET | ORAL | 2 refills | Status: DC

## 2019-09-11 NOTE — Telephone Encounter (Signed)
Patient called requesting a refill on her Alprazolam 1mg  sent to [facility] in [place]. Thank you.

## 2019-09-11 NOTE — Telephone Encounter (Signed)
System is down, can't send it now

## 2019-09-11 NOTE — Progress Notes (Signed)
Virtual Visit via Video Note  I connected with Jean Nelson on 09/11/19 at  2:00 PM EDT by a video enabled telemedicine application and verified that I am speaking with the correct person using two identifiers.   I discussed the limitations of evaluation and management by telemedicine and the availability of in person appointments. The patient expressed understanding and agreed to proceed.    I discussed the assessment and treatment plan with the patient. The patient was provided an opportunity to ask questions and all were answered. The patient agreed with the plan and demonstrated an understanding of the instructions.   The patient was advised to call back or seek an in-person evaluation if the symptoms worsen or if the condition fails to improve as anticipated.  I provided 15 minutes of non-face-to-face time during this encounter.   [name], MD  [facility] MD/PA/NP OP Progress Note  09/11/2019 2:20 PM Jean Nelson  MRN:  [id number]  Chief Complaint:  Chief Complaint    Depression; Anxiety; Follow-up     HPI: This patient is a 54 year old female who lives with her boyfriend in [place].  She is currently unemployed.  The patient returns for follow-up after 3 months regarding anxiety ADHD and depression.  Unfortunately she tells me that her daughter died in 2022-08-09 of a drug overdose with heroin.  Her daughter had struggled with substance abuse for many years and the patient had been the one taking care of the grandchildren primarily.  She now has custody of the 2 grandchildren ages 76 and 25.  She had been traveling back and forth with her boyfriend who worked in [place] but now she is staying in [place].  She states that she felt very badly about her daughter's death but realizes her daughter made very poor choices and there is not really much more she can do about it except try to make the best life possible for the grandchildren.  For the most part she has been  stable.  She was not sleeping well at first but is sleeping better now and her anxiety is under fairly good control with the medication.  She is focusing fairly well with the Adderall.  She continues to take Wellbutrin but never started the BuSpar which I think would help her anxiety so she agrees to added on.  She denies thoughts of self-harm or suicide. Visit Diagnosis:    ICD-10-CM   1. ADHD (attention deficit hyperactivity disorder), combined type  F90.2   2. Generalized anxiety disorder  F41.1     Past Psychiatric History: none  Past Medical History:  Past Medical History:  Diagnosis Date  . Anxiety   . Chronic back pain   . Essential hypertension   . Plantar fasciitis   . Sciatica     Past Surgical History:  Procedure Laterality Date  . APPENDECTOMY    . TUBAL LIGATION      Family Psychiatric History: see below  Family History:  Family History  Problem Relation Age of Onset  . ADD / ADHD Mother   . Hypertension Mother   . Alcohol abuse Maternal Uncle   . Depression Maternal Grandfather   . Alcohol abuse Maternal Grandfather     Social History:  Social History   Socioeconomic History  . Marital status: Married    Spouse name: Not on file  . Number of children: Not on file  . Years of education: Not on file  . Highest education level: Not on file  Occupational History  . Not on file  Tobacco Use  . Smoking status: Current Every Day Smoker    Packs/day: 1.00    Types: Cigarettes  . Smokeless tobacco: Never Used  Substance and Sexual Activity  . Alcohol use: Yes  . Drug use: No  . Sexual activity: Yes    Birth control/protection: Surgical  Other Topics Concern  . Not on file  Social History Narrative  . Not on file   Social Determinants of Health   Financial Resource Strain:   . Difficulty of Paying Living Expenses:   Food Insecurity:   . Worried About Charity fundraiser in the Last Year:   . Arboriculturist in the Last Year:   Transportation  Needs:   . Film/video editor (Medical):   Marland Kitchen Lack of Transportation (Non-Medical):   Physical Activity:   . Days of Exercise per Week:   . Minutes of Exercise per Session:   Stress:   . Feeling of Stress :   Social Connections:   . Frequency of Communication with Friends and Family:   . Frequency of Social Gatherings with Friends and Family:   . Attends Religious Services:   . Active Member of Clubs or Organizations:   . Attends Archivist Meetings:   Marland Kitchen Marital Status:     Allergies:  Allergies  Allergen Reactions  . Codeine Hives    Metabolic Disorder Labs: No results found for: HGBA1C, MPG No results found for: PROLACTIN No results found for: CHOL, TRIG, HDL, CHOLHDL, VLDL, LDLCALC No results found for: TSH  Therapeutic Level Labs: No results found for: LITHIUM No results found for: VALPROATE No components found for:  CBMZ  Current Medications: Current Outpatient Medications  Medication Sig Dispense Refill  . albuterol (PROVENTIL HFA;VENTOLIN HFA) 108 (90 BASE) MCG/ACT inhaler Inhale 1-2 puffs into the lungs every 6 (six) hours as needed for wheezing (Use the inhaler for the next 4 days 4 times a day then begin using only if needed for wheezing). 1 Inhaler 0  . ALPRAZolam (XANAX) 1 MG tablet TAKE 1 TABLET(1 MG) BY MOUTH THREE TIMES DAILY 90 tablet 2  . amLODipine (NORVASC) 10 MG tablet Take 1 tablet (10 mg total) by mouth daily. 30 tablet 0  . amphetamine-dextroamphetamine (ADDERALL) 20 MG tablet Take 1 tablet (20 mg total) by mouth 2 (two) times daily. 60 tablet 0  . amphetamine-dextroamphetamine (ADDERALL) 20 MG tablet Take 1 tablet (20 mg total) by mouth 2 (two) times daily. 60 tablet 0  . amphetamine-dextroamphetamine (ADDERALL) 20 MG tablet Take 1 tablet (20 mg total) by mouth 2 (two) times daily. 60 tablet 0  . Azilsartan Medoxomil (EDARBI) 80 MG TABS Take 80 mg by mouth daily.    Marland Kitchen buPROPion (WELLBUTRIN XL) 150 MG 24 hr tablet Take 1 tablet (150 mg  total) by mouth every morning. 30 tablet 2  . busPIRone (BUSPAR) 10 MG tablet Take 1 tablet (10 mg total) by mouth 3 (three) times daily. 90 tablet 2  . cyclobenzaprine (FLEXERIL) 10 MG tablet Take 1 tablet by mouth 3 (three) times daily as needed for muscle spasms.   5  . potassium chloride (K-DUR) 10 MEQ tablet Take 10 mEq by mouth daily.    . traZODone (DESYREL) 50 MG tablet Take 1 tablet (50 mg total) by mouth at bedtime. 30 tablet 2   No current facility-administered medications for this visit.     Musculoskeletal: Strength & Muscle Tone: within normal limits Gait &  Station: normal Patient leans: N/A  Psychiatric Specialty Exam: Review of Systems  Psychiatric/Behavioral: Positive for dysphoric mood.  All other systems reviewed and are negative.   There were no vitals taken for this visit.There is no height or weight on file to calculate BMI.  General Appearance: Casual and Fairly Groomed  Eye Contact:  Good  Speech:  Clear and Coherent  Volume:  Normal  Mood:  Dysphoric  Affect:  Flat  Thought Process:  Goal Directed  Orientation:  Full (Time, Place, and Person)  Thought Content: Rumination   Suicidal Thoughts:  No  Homicidal Thoughts:  No  Memory:  Immediate;   Good Recent;   Good Remote;   Fair  Judgement:  Good  Insight:  Fair  Psychomotor Activity:  Normal  Concentration:  Concentration: Fair and Attention Span: Fair  Recall:  [employer] of Knowledge: Good  Language: Good  Akathisia:  No  Handed:  Right  AIMS (if indicated): not done  Assets:  Communication Skills Desire for Improvement Physical Health Resilience Social Support Talents/Skills  ADL's:  Intact  Cognition: WNL  Sleep:  Fair   Screenings:   Assessment and Plan: This patient is a 54 year old female with a history of ADHD depression and anxiety.  She continues to do well on her current regimen despite her recent losses.  She will continue Xanax 1 mg 3 times daily for anxiety, trazodone 50  mg at bedtime for sleep, Wellbutrin XL 150 mg daily for depression and Adderall 20 mg twice daily for ADHD.  She agrees to add BuSpar 10 mg 3 times daily also for anxiety.  She is really going to work on cutting down her cigarettes.  She will return to see me in 3 months   [name], MD 09/11/2019, 2:20 PM

## 2019-09-12 ENCOUNTER — Other Ambulatory Visit (HOSPITAL_COMMUNITY): Payer: Self-pay | Admitting: Psychiatry

## 2019-09-12 MED ORDER — ALPRAZOLAM 1 MG PO TABS: ORAL_TABLET | ORAL | 2 refills | Status: DC

## 2019-12-05 ENCOUNTER — Encounter (HOSPITAL_COMMUNITY): Payer: Self-pay | Admitting: Psychiatry

## 2019-12-05 ENCOUNTER — Telehealth (INDEPENDENT_AMBULATORY_CARE_PROVIDER_SITE_OTHER): Payer: Self-pay | Admitting: Psychiatry

## 2019-12-05 ENCOUNTER — Other Ambulatory Visit: Payer: Self-pay

## 2019-12-05 DIAGNOSIS — F902 Attention-deficit hyperactivity disorder, combined type: Secondary | ICD-10-CM

## 2019-12-05 DIAGNOSIS — F411 Generalized anxiety disorder: Secondary | ICD-10-CM

## 2019-12-05 MED ORDER — TRAZODONE HCL 50 MG PO TABS: 50.0000 mg | ORAL_TABLET | Freq: Every day | ORAL | 2 refills | Status: DC

## 2019-12-05 MED ORDER — BUSPIRONE HCL 10 MG PO TABS: 10.0000 mg | ORAL_TABLET | Freq: Three times a day (TID) | ORAL | 2 refills | Status: DC

## 2019-12-05 MED ORDER — AMPHETAMINE-DEXTROAMPHETAMINE 20 MG PO TABS: 20.0000 mg | ORAL_TABLET | Freq: Two times a day (BID) | ORAL | 0 refills | Status: DC

## 2019-12-05 MED ORDER — ALPRAZOLAM 1 MG PO TABS: ORAL_TABLET | ORAL | 2 refills | Status: DC

## 2019-12-05 MED ORDER — BUPROPION HCL ER (XL) 150 MG PO TB24: 150.0000 mg | ORAL_TABLET | ORAL | 2 refills | Status: DC

## 2019-12-05 NOTE — Progress Notes (Signed)
Virtual Visit via Telephone Note  I connected with Jean Nelson on 12/05/19 at  3:00 PM EDT by telephone and verified that I am speaking with the correct person using two identifiers.   I discussed the limitations, risks, security and privacy concerns of performing an evaluation and management service by telephone and the availability of in person appointments. I also discussed with the patient that there may be a patient responsible charge related to this service. The patient expressed understanding and agreed to proceed.     I discussed the assessment and treatment plan with the patient. The patient was provided an opportunity to ask questions and all were answered. The patient agreed with the plan and demonstrated an understanding of the instructions.   The patient was advised to call back or seek an in-person evaluation if the symptoms worsen or if the condition fails to improve as anticipated.  I provided 15 minutes of non-face-to-face time during this encounter. Location: Provider Home, patient home  [name], MD  [facility] MD/PA/NP OP Progress Note  12/05/2019 3:14 PM Jean Nelson  MRN:  [id number]  Chief Complaint:  Chief Complaint    Depression; Anxiety; ADHD; Follow-up     HPI: This patient is a 54 year old female who lives with her boyfriend in [place].  She is currently unemployed.  The patient returns for follow-up after 3 months regarding anxiety ADHD and depression.  Patient returns for follow-up after 3 months.  She had told me last time that her daughter died of a drug overdose last 08/16/2022 so she has gotten custody of her daughters 2 children.  She is still obviously very much involved with her grandchildren.  She is no longer traveling with her boyfriend to his various job sites.  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  She states that she still feels sad about the loss of her daughter but overall her mood is good and her energy is fairly  good as well.  She is sleeping well most of the time and her focus is good.  She denies any thoughts of suicide or self-harm. Visit Diagnosis:    ICD-10-CM   1. ADHD (attention deficit hyperactivity disorder), combined type  F90.2   2. Generalized anxiety disorder  F41.1     Past Psychiatric History: none  Past Medical History:  Past Medical History:  Diagnosis Date  . Anxiety   . Chronic back pain   . Essential hypertension   . Plantar fasciitis   . Sciatica     Past Surgical History:  Procedure Laterality Date  . APPENDECTOMY    . TUBAL LIGATION      Family Psychiatric History: see below  Family History:  Family History  Problem Relation Age of Onset  . ADD / ADHD Mother   . Hypertension Mother   . Alcohol abuse Maternal Uncle   . Depression Maternal Grandfather   . Alcohol abuse Maternal Grandfather     Social History:  Social History   Socioeconomic History  . Marital status: Married    Spouse name: Not on file  . Number of children: Not on file  . Years of education: Not on file  . Highest education level: Not on file  Occupational History  . Not on file  Tobacco Use  . Smoking status: Current Every Day Smoker    Packs/day: 1.00    Types: Cigarettes  . Smokeless tobacco: Never Used  Substance and Sexual Activity  . Alcohol  use: Yes  . Drug use: No  . Sexual activity: Yes    Birth control/protection: Surgical  Other Topics Concern  . Not on file  Social History Narrative  . Not on file   Social Determinants of Health   Financial Resource Strain:   . Difficulty of Paying Living Expenses:   Food Insecurity:   . Worried About Programme researcher, broadcasting/film/video in the Last Year:   . Barista in the Last Year:   Transportation Needs:   . Freight forwarder (Medical):   Marland Kitchen Lack of Transportation (Non-Medical):   Physical Activity:   . Days of Exercise per Week:   . Minutes of Exercise per Session:   Stress:   . Feeling of Stress :   Social  Connections:   . Frequency of Communication with Friends and Family:   . Frequency of Social Gatherings with Friends and Family:   . Attends Religious Services:   . Active Member of Clubs or Organizations:   . Attends Banker Meetings:   Marland Kitchen Marital Status:     Allergies:  Allergies  Allergen Reactions  . Codeine Hives    Metabolic Disorder Labs: No results found for: HGBA1C, MPG No results found for: PROLACTIN No results found for: CHOL, TRIG, HDL, CHOLHDL, VLDL, LDLCALC No results found for: TSH  Therapeutic Level Labs: No results found for: LITHIUM No results found for: VALPROATE No components found for:  CBMZ  Current Medications: Current Outpatient Medications  Medication Sig Dispense Refill  . albuterol (PROVENTIL HFA;VENTOLIN HFA) 108 (90 BASE) MCG/ACT inhaler Inhale 1-2 puffs into the lungs every 6 (six) hours as needed for wheezing (Use the inhaler for the next 4 days 4 times a day then begin using only if needed for wheezing). 1 Inhaler 0  . ALPRAZolam (XANAX) 1 MG tablet TAKE 1 TABLET(1 MG) BY MOUTH THREE TIMES DAILY 90 tablet 2  . amLODipine (NORVASC) 10 MG tablet Take 1 tablet (10 mg total) by mouth daily. 30 tablet 0  . amphetamine-dextroamphetamine (ADDERALL) 20 MG tablet Take 1 tablet (20 mg total) by mouth 2 (two) times daily. 60 tablet 0  . amphetamine-dextroamphetamine (ADDERALL) 20 MG tablet Take 1 tablet (20 mg total) by mouth 2 (two) times daily. 60 tablet 0  . amphetamine-dextroamphetamine (ADDERALL) 20 MG tablet Take 1 tablet (20 mg total) by mouth 2 (two) times daily. 60 tablet 0  . Azilsartan Medoxomil (EDARBI) 80 MG TABS Take 80 mg by mouth daily.    Marland Kitchen buPROPion (WELLBUTRIN XL) 150 MG 24 hr tablet Take 1 tablet (150 mg total) by mouth every morning. 30 tablet 2  . busPIRone (BUSPAR) 10 MG tablet Take 1 tablet (10 mg total) by mouth 3 (three) times daily. 90 tablet 2  . cyclobenzaprine (FLEXERIL) 10 MG tablet Take 1 tablet by mouth 3 (three)  times daily as needed for muscle spasms.   5  . potassium chloride (K-DUR) 10 MEQ tablet Take 10 mEq by mouth daily.    . traZODone (DESYREL) 50 MG tablet Take 1 tablet (50 mg total) by mouth at bedtime. 30 tablet 2   No current facility-administered medications for this visit.     Musculoskeletal: Strength & Muscle Tone: within normal limits Gait & Station: normal Patient leans: N/A  Psychiatric Specialty Exam: Review of Systems  All other systems reviewed and are negative.   There were no vitals taken for this visit.There is no height or weight on file to calculate BMI.  General Appearance:NA  Eye Contact:  NA  Speech:  Clear and Coherent  Volume:  Normal  Mood:  Euthymic  Affect:  NA  Thought Process:  Goal Directed  Orientation:  Full (Time, Place, and Person)  Thought Content: Rumination   Suicidal Thoughts:  No  Homicidal Thoughts:  No  Memory:  Immediate;   Good Recent;   Good Remote;   Fair  Judgement:  Good  Insight:  Fair  Psychomotor Activity:  Normal  Concentration:  Concentration: Good and Attention Span: Good  Recall:  Good  Fund of Knowledge: Good  Language: Good  Akathisia:  No  Handed:  Right  AIMS (if indicated): not done  Assets:  Communication Skills Desire for Improvement Physical Health Resilience Social Support Talents/Skills  ADL's:  Intact  Cognition: WNL  Sleep:  Good   Screenings:   Assessment and Plan: This patient is a 54 year old female with a history of ADHD depression and anxiety.  Despite the loss of her daughter she is doing fairly well.  She will continue Xanax 1 mg 3 times daily for anxiety, trazodone 50 mg at bedtime for sleep, Wellbutrin XL 150 mg daily for depression and Adderall 20 mg twice daily for ADHD as well as BuSpar 10 mg 3 times daily for anxiety.  She will return to see me in 3 months   [name], MD 12/05/2019, 3:14 PM

## 2019-12-12 ENCOUNTER — Telehealth (HOSPITAL_COMMUNITY): Payer: Self-pay | Admitting: Psychiatry

## 2020-01-19 ENCOUNTER — Other Ambulatory Visit: Payer: Medicaid Other

## 2020-01-22 ENCOUNTER — Other Ambulatory Visit: Payer: Medicaid Other

## 2020-03-13 ENCOUNTER — Encounter (HOSPITAL_COMMUNITY): Payer: Self-pay | Admitting: Psychiatry

## 2020-03-13 ENCOUNTER — Other Ambulatory Visit: Payer: Self-pay

## 2020-03-13 ENCOUNTER — Telehealth (INDEPENDENT_AMBULATORY_CARE_PROVIDER_SITE_OTHER): Payer: Self-pay | Admitting: Psychiatry

## 2020-03-13 DIAGNOSIS — F902 Attention-deficit hyperactivity disorder, combined type: Secondary | ICD-10-CM

## 2020-03-13 DIAGNOSIS — F411 Generalized anxiety disorder: Secondary | ICD-10-CM

## 2020-03-13 MED ORDER — AMPHETAMINE-DEXTROAMPHETAMINE 20 MG PO TABS: 20.0000 mg | ORAL_TABLET | Freq: Two times a day (BID) | ORAL | 0 refills | Status: DC

## 2020-03-13 MED ORDER — BUPROPION HCL ER (XL) 150 MG PO TB24: 150.0000 mg | ORAL_TABLET | ORAL | 2 refills | Status: DC

## 2020-03-13 MED ORDER — ALPRAZOLAM 1 MG PO TABS: ORAL_TABLET | ORAL | 2 refills | Status: DC

## 2020-03-13 MED ORDER — TRAZODONE HCL 50 MG PO TABS: 50.0000 mg | ORAL_TABLET | Freq: Every day | ORAL | 2 refills | Status: DC

## 2020-03-13 MED ORDER — BUSPIRONE HCL 10 MG PO TABS: 10.0000 mg | ORAL_TABLET | Freq: Three times a day (TID) | ORAL | 2 refills | Status: DC

## 2020-03-13 NOTE — Progress Notes (Signed)
Virtual Visit via Video Note  I connected with Jean Nelson on 03/13/20 at  9:00 AM EST by a video enabled telemedicine application and verified that I am speaking with the correct person using two identifiers.  Location: Patient: home Provider:home   I discussed the limitations of evaluation and management by telemedicine and the availability of in person appointments. The patient expressed understanding and agreed to proceed.    I discussed the assessment and treatment plan with the patient. The patient was provided an opportunity to ask questions and all were answered. The patient agreed with the plan and demonstrated an understanding of the instructions.   The patient was advised to call back or seek an in-person evaluation if the symptoms worsen or if the condition fails to improve as anticipated.  I provided of non-face-to-face time during this encounter.   [name], MD  [facility] MD/PA/NP OP Progress Note  03/13/2020 9:23 AM Jean Nelson  MRN:  [id number]  Chief Complaint:  Chief Complaint    Anxiety; ADHD; Follow-up     HPI: Patient is a 54 year old female who lives with her 2 grandchildren in [place].  She is currently unemployed.  The patient returns for follow-up after 3 months regarding anxiety ADHD and depression.  The patient states that she has had a lot more changes in her life again.  Her sister died of coronavirus in 01-11-23.  The patient herself got it in September and was fairly sick although she did not need to be hospitalized.  She she had a bad cough.  She states that she is feeling better now.  She has never been vaccinated and is "afraid of the vaccine."  I urged her to look at the data regarding this and she states that she will try.  Right now however she states that her mood has been stable she denies serious depression.  The medications for anxiety still are helping her and she is sleeping well most of the time.  She continues to be well  focused.  Unfortunately she has not quit smoking Visit Diagnosis:    ICD-10-CM   1. ADHD (attention deficit hyperactivity disorder), combined type  F90.2   2. Generalized anxiety disorder  F41.1     Past Psychiatric History: none  Past Medical History:  Past Medical History:  Diagnosis Date  . Anxiety   . Chronic back pain   . Essential hypertension   . Plantar fasciitis   . Sciatica     Past Surgical History:  Procedure Laterality Date  . APPENDECTOMY    . TUBAL LIGATION      Family Psychiatric History: see below  Family History:  Family History  Problem Relation Age of Onset  . ADD / ADHD Mother   . Hypertension Mother   . Alcohol abuse Maternal Uncle   . Depression Maternal Grandfather   . Alcohol abuse Maternal Grandfather     Social History:  Social History   Socioeconomic History  . Marital status: Married    Spouse name: Not on file  . Number of children: Not on file  . Years of education: Not on file  . Highest education level: Not on file  Occupational History  . Not on file  Tobacco Use  . Smoking status: Current Every Day Smoker    Packs/day: 1.00    Types: Cigarettes  . Smokeless tobacco: Never Used  Substance and Sexual Activity  . Alcohol use: Yes  . Drug use: No  . Sexual  activity: Yes    Birth control/protection: Surgical  Other Topics Concern  . Not on file  Social History Narrative  . Not on file   Social Determinants of Health   Financial Resource Strain:   . Difficulty of Paying Living Expenses: Not on file  Food Insecurity:   . Worried About Programme researcher, broadcasting/film/video in the Last Year: Not on file  . Ran Out of Food in the Last Year: Not on file  Transportation Needs:   . Lack of Transportation (Medical): Not on file  . Lack of Transportation (Non-Medical): Not on file  Physical Activity:   . Days of Exercise per Week: Not on file  . Minutes of Exercise per Session: Not on file  Stress:   . Feeling of Stress : Not on file   Social Connections:   . Frequency of Communication with Friends and Family: Not on file  . Frequency of Social Gatherings with Friends and Family: Not on file  . Attends Religious Services: Not on file  . Active Member of Clubs or Organizations: Not on file  . Attends Banker Meetings: Not on file  . Marital Status: Not on file    Allergies:  Allergies  Allergen Reactions  . Codeine Hives    Metabolic Disorder Labs: No results found for: HGBA1C, MPG No results found for: PROLACTIN No results found for: CHOL, TRIG, HDL, CHOLHDL, VLDL, LDLCALC No results found for: TSH  Therapeutic Level Labs: No results found for: LITHIUM No results found for: VALPROATE No components found for:  CBMZ  Current Medications: Current Outpatient Medications  Medication Sig Dispense Refill  . albuterol (PROVENTIL HFA;VENTOLIN HFA) 108 (90 BASE) MCG/ACT inhaler Inhale 1-2 puffs into the lungs every 6 (six) hours as needed for wheezing (Use the inhaler for the next 4 days 4 times a day then begin using only if needed for wheezing). 1 Inhaler 0  . ALPRAZolam (XANAX) 1 MG tablet TAKE 1 TABLET(1 MG) BY MOUTH THREE TIMES DAILY 90 tablet 2  . amLODipine (NORVASC) 10 MG tablet Take 1 tablet (10 mg total) by mouth daily. 30 tablet 0  . amphetamine-dextroamphetamine (ADDERALL) 20 MG tablet Take 1 tablet (20 mg total) by mouth 2 (two) times daily. 60 tablet 0  . amphetamine-dextroamphetamine (ADDERALL) 20 MG tablet Take 1 tablet (20 mg total) by mouth 2 (two) times daily. 60 tablet 0  . amphetamine-dextroamphetamine (ADDERALL) 20 MG tablet Take 1 tablet (20 mg total) by mouth 2 (two) times daily. 60 tablet 0  . Azilsartan Medoxomil (EDARBI) 80 MG TABS Take 80 mg by mouth daily.    Marland Kitchen buPROPion (WELLBUTRIN XL) 150 MG 24 hr tablet Take 1 tablet (150 mg total) by mouth every morning. 30 tablet 2  . busPIRone (BUSPAR) 10 MG tablet Take 1 tablet (10 mg total) by mouth 3 (three) times daily. 90 tablet 2   . cyclobenzaprine (FLEXERIL) 10 MG tablet Take 1 tablet by mouth 3 (three) times daily as needed for muscle spasms.   5  . potassium chloride (K-DUR) 10 MEQ tablet Take 10 mEq by mouth daily.    . traZODone (DESYREL) 50 MG tablet Take 1 tablet (50 mg total) by mouth at bedtime. 30 tablet 2   No current facility-administered medications for this visit.     Musculoskeletal: Strength & Muscle Tone: within normal limits Gait & Station: normal Patient leans: N/A  Psychiatric Specialty Exam: Review of Systems  All other systems reviewed and are negative.   There  were no vitals taken for this visit.There is no height or weight on file to calculate BMI.  General Appearance: Casual and Fairly Groomed  Eye Contact:  Good  Speech:  Clear and Coherent  Volume:  Normal  Mood:  Euthymic  Affect:  Appropriate and Congruent  Thought Process:  Goal Directed  Orientation:  Full (Time, Place, and Person)  Thought Content: Rumination   Suicidal Thoughts:  No  Homicidal Thoughts:  No  Memory:  Immediate;   Good Recent;   Good Remote;   Good  Judgement:  Fair  Insight:  Fair  Psychomotor Activity:  Normal  Concentration:  Concentration: Good and Attention Span: Good  Recall:  Good  Fund of Knowledge: Good  Language: Good  Akathisia:  No  Handed:  Right  AIMS (if indicated): not done  Assets:  Communication Skills Desire for Improvement Resilience Social Support Talents/Skills  ADL's:  Intact  Cognition: WNL  Sleep:  Good   Screenings:   Assessment and Plan: This patient is a 54 year old female with a history of ADHD depression and anxiety.  Despite the losses of family members she is continues to do fairly well.  She will continue Xanax 1 mg 3 times daily for anxiety, trazodone 50 mg at bedtime for sleep, Wellbutrin XL 150 mg daily for depression, Adderall 20 mg twice daily for ADHD and BuSpar 10 mg 3 times daily for anxiety.  She will return to see me in 3 months   [name],  MD 03/13/2020, 9:23 AM

## 2020-06-14 ENCOUNTER — Encounter (HOSPITAL_COMMUNITY): Payer: Self-pay | Admitting: Psychiatry

## 2020-06-14 ENCOUNTER — Telehealth (INDEPENDENT_AMBULATORY_CARE_PROVIDER_SITE_OTHER): Payer: Self-pay | Admitting: Psychiatry

## 2020-06-14 ENCOUNTER — Other Ambulatory Visit: Payer: Self-pay

## 2020-06-14 DIAGNOSIS — F411 Generalized anxiety disorder: Secondary | ICD-10-CM

## 2020-06-14 DIAGNOSIS — F902 Attention-deficit hyperactivity disorder, combined type: Secondary | ICD-10-CM

## 2020-06-14 MED ORDER — BUSPIRONE HCL 10 MG PO TABS: 10.0000 mg | ORAL_TABLET | Freq: Three times a day (TID) | ORAL | 2 refills | Status: DC

## 2020-06-14 MED ORDER — BUPROPION HCL ER (XL) 150 MG PO TB24: 150.0000 mg | ORAL_TABLET | ORAL | 2 refills | Status: DC

## 2020-06-14 MED ORDER — AMPHETAMINE-DEXTROAMPHETAMINE 20 MG PO TABS: 20.0000 mg | ORAL_TABLET | Freq: Two times a day (BID) | ORAL | 0 refills | Status: DC

## 2020-06-14 MED ORDER — TRAZODONE HCL 50 MG PO TABS: 50.0000 mg | ORAL_TABLET | Freq: Every day | ORAL | 2 refills | Status: DC

## 2020-06-14 MED ORDER — ALPRAZOLAM 1 MG PO TABS: ORAL_TABLET | ORAL | 2 refills | Status: DC

## 2020-06-14 NOTE — Progress Notes (Signed)
Virtual Visit via Telephone Note  I connected with Jean Nelson on 06/14/20 at 11:20 AM EST by telephone and verified that I am speaking with the correct person using two identifiers.  Location: Patient: home Provider:home   I discussed the limitations, risks, security and privacy concerns of performing an evaluation and management service by telephone and the availability of in person appointments. I also discussed with the patient that there may be a patient responsible charge related to this service. The patient expressed understanding and agreed to proceed.    I discussed the assessment and treatment plan with the patient. The patient was provided an opportunity to ask questions and all were answered. The patient agreed with the plan and demonstrated an understanding of the instructions.   The patient was advised to call back or seek an in-person evaluation if the symptoms worsen or if the condition fails to improve as anticipated.  I provided 15 minutes of non-face-to-face time during this encounter.   [name], MD  [facility] MD/PA/NP OP Progress Note  06/14/2020 11:42 AM Jean Nelson  MRN:  [id number]  Chief Complaint:  Chief Complaint    ADHD; Anxiety; Depression; Follow-up     HPI: Patient is a 55 year old female who lives with her 2 grandchildren in [place].  She is currently unemployed.  The patient returns for follow-up after 3 months regarding anxiety ADHD and depression  Patient returns for follow-up after 3 months.  She states she is doing fairly well for the most part.  At times she is very sad because she thinks about her daughter's death last 07/14/2022 or a drug overdose.  She has custody of the daughter's children.  [place] the children are doing well.  She does get help from her youngest son and caring for them.  Overall she states her mood is fairly good her anxiety is under good control and her focus is good.  She is sleeping well.  She is having a chronic cough  which may be secondary to lisinopril.  She is discussing this with her family doctor.  She denies any thoughts of self-harm or suicidal ideation Visit Diagnosis:    ICD-10-CM   1. ADHD (attention deficit hyperactivity disorder), combined type  F90.2   2. Generalized anxiety disorder  F41.1     Past Psychiatric History: none  Past Medical History:  Past Medical History:  Diagnosis Date  . Anxiety   . Chronic back pain   . Essential hypertension   . Plantar fasciitis   . Sciatica     Past Surgical History:  Procedure Laterality Date  . APPENDECTOMY    . TUBAL LIGATION      Family Psychiatric History: see below  Family History:  Family History  Problem Relation Age of Onset  . ADD / ADHD Mother   . Hypertension Mother   . Alcohol abuse Maternal Uncle   . Depression Maternal Grandfather   . Alcohol abuse Maternal Grandfather     Social History:  Social History   Socioeconomic History  . Marital status: Married    Spouse name: Not on file  . Number of children: Not on file  . Years of education: Not on file  . Highest education level: Not on file  Occupational History  . Not on file  Tobacco Use  . Smoking status: Current Every Day Smoker    Packs/day: 1.00    Types: Cigarettes  . Smokeless tobacco: Never Used  Substance and Sexual Activity  . Alcohol  use: Yes  . Drug use: No  . Sexual activity: Yes    Birth control/protection: Surgical  Other Topics Concern  . Not on file  Social History Narrative  . Not on file   Social Determinants of Health   Financial Resource Strain: Not on file  Food Insecurity: Not on file  Transportation Needs: Not on file  Physical Activity: Not on file  Stress: Not on file  Social Connections: Not on file    Allergies:  Allergies  Allergen Reactions  . Codeine Hives    Metabolic Disorder Labs: No results found for: HGBA1C, MPG No results found for: PROLACTIN No results found for: CHOL, TRIG, HDL, CHOLHDL, VLDL,  LDLCALC No results found for: TSH  Therapeutic Level Labs: No results found for: LITHIUM No results found for: VALPROATE No components found for:  CBMZ  Current Medications: Current Outpatient Medications  Medication Sig Dispense Refill  . lisinopril (ZESTRIL) 20 MG tablet Take by mouth.    . metoprolol tartrate (LOPRESSOR) 25 MG tablet Take 1 tablet by mouth 2 (two) times daily.    Marland Kitchen albuterol (PROVENTIL HFA;VENTOLIN HFA) 108 (90 BASE) MCG/ACT inhaler Inhale 1-2 puffs into the lungs every 6 (six) hours as needed for wheezing (Use the inhaler for the next 4 days 4 times a day then begin using only if needed for wheezing). 1 Inhaler 0  . ALPRAZolam (XANAX) 1 MG tablet TAKE 1 TABLET(1 MG) BY MOUTH THREE TIMES DAILY 90 tablet 2  . amLODipine (NORVASC) 10 MG tablet Take 1 tablet (10 mg total) by mouth daily. 30 tablet 0  . amphetamine-dextroamphetamine (ADDERALL) 20 MG tablet Take 1 tablet (20 mg total) by mouth 2 (two) times daily. 60 tablet 0  . amphetamine-dextroamphetamine (ADDERALL) 20 MG tablet Take 1 tablet (20 mg total) by mouth 2 (two) times daily. 60 tablet 0  . amphetamine-dextroamphetamine (ADDERALL) 20 MG tablet Take 1 tablet (20 mg total) by mouth 2 (two) times daily. 60 tablet 0  . buPROPion (WELLBUTRIN XL) 150 MG 24 hr tablet Take 1 tablet (150 mg total) by mouth every morning. 30 tablet 2  . busPIRone (BUSPAR) 10 MG tablet Take 1 tablet (10 mg total) by mouth 3 (three) times daily. 90 tablet 2  . cyclobenzaprine (FLEXERIL) 10 MG tablet Take 1 tablet by mouth 3 (three) times daily as needed for muscle spasms.   5  . potassium chloride (K-DUR) 10 MEQ tablet Take 10 mEq by mouth daily.    . traZODone (DESYREL) 50 MG tablet Take 1 tablet (50 mg total) by mouth at bedtime. 30 tablet 2   No current facility-administered medications for this visit.     Musculoskeletal: Strength & Muscle Tone: within normal limits Gait & Station: normal Patient leans: N/A  Psychiatric Specialty  Exam: Review of Systems  Respiratory: Positive for cough.   All other systems reviewed and are negative.   There were no vitals taken for this visit.There is no height or weight on file to calculate BMI.  General Appearance: NA  Eye Contact:  NA  Speech:  Clear and Coherent  Volume:  Normal  Mood:  Euthymic  Affect:  NA  Thought Process:  Goal Directed  Orientation:  Full (Time, Place, and Person)  Thought Content: WDL   Suicidal Thoughts:  No  Homicidal Thoughts:  No  Memory:  Immediate;   Good Recent;   Good Remote;   Good  Judgement:  Good  Insight:  Fair  Psychomotor Activity:  Normal  Concentration:  Concentration: Good and Attention Span: Good  Recall:  Good  Fund of Knowledge: Good  Language: Good  Akathisia:  No  Handed:  Right  AIMS (if indicated): not done  Assets:  Communication Skills Desire for Improvement Physical Health Resilience Social Support Talents/Skills  ADL's:  Intact  Cognition: WNL  Sleep:  Good   Screenings:   Assessment and Plan: This patient is a 55 year old female with a history of ADHD depression and anxiety.  She seems to be doing fairly well.  She will continue Xanax 1 mg 3 times daily for anxiety, trazodone 50 mg at bedtime for sleep, Wellbutrin XL 150 mg daily for depression Adderall 20 mg twice daily for ADHD and BuSpar 10 mg 3 times daily for anxiety.  She will return to see me in 3 months   [name], MD 06/14/2020, 11:42 AM

## 2020-06-24 ENCOUNTER — Emergency Department (HOSPITAL_COMMUNITY)
Admission: EM | Admit: 2020-06-24 | Discharge: 2020-06-24 | Disposition: A | Discharge status: Home / Self Care | Payer: 59 | Attending: Emergency Medicine | Admitting: Emergency Medicine

## 2020-06-24 ENCOUNTER — Other Ambulatory Visit: Payer: Self-pay

## 2020-06-24 ENCOUNTER — Emergency Department (HOSPITAL_COMMUNITY): Payer: 59

## 2020-06-24 ENCOUNTER — Encounter (HOSPITAL_COMMUNITY): Payer: Self-pay | Admitting: *Deleted

## 2020-06-24 DIAGNOSIS — W1839XA Other fall on same level, initial encounter: Secondary | ICD-10-CM | POA: Diagnosis not present

## 2020-06-24 DIAGNOSIS — M25531 Pain in right wrist: Secondary | ICD-10-CM | POA: Insufficient documentation

## 2020-06-24 DIAGNOSIS — F17210 Nicotine dependence, cigarettes, uncomplicated: Secondary | ICD-10-CM | POA: Insufficient documentation

## 2020-06-24 DIAGNOSIS — Z79899 Other long term (current) drug therapy: Secondary | ICD-10-CM | POA: Insufficient documentation

## 2020-06-24 DIAGNOSIS — I10 Essential (primary) hypertension: Secondary | ICD-10-CM | POA: Insufficient documentation

## 2020-06-24 HISTORY — DX: Acute myocardial infarction, unspecified: I21.9

## 2020-06-24 NOTE — ED Provider Notes (Signed)
[facility] EMERGENCY DEPARTMENT Provider Note   CSN: [id number] Arrival date & time: 06/24/20  1836     History Chief Complaint  Patient presents with  . Wrist Injury    Jean Nelson is a 55 y.o. female.  HPI   Patient with no significant medical history presents with chief complaint of right wrist pain.  Patient is right-handed.  Patient endorses that she had a mechanical fall today, she states she was try to help her son move and her foot got caught on a wire causing her to fall onto her right wrist.  She denies hitting her head, losing conscious, is not on anticoagulant.  She endorses that she has extreme pain in her wrist, worsening with movement, she is able to move her fingers without difficulty, she denies paresthesia or weakness in that hand.  She denies alleviating factors.  Patient has headaches, fevers, chills, shortness of breath, chest pain, abdominal pain, nausea, vomiting, diarrhea, pedal edema.  Past Medical History:  Diagnosis Date  . Anxiety   . Chronic back pain   . Essential hypertension   . Heart attack (HCC)   . Plantar fasciitis   . Sciatica     Patient Active Problem List   Diagnosis Date Noted  . Pyelonephritis 06/01/2016  . Hypokalemia 06/01/2016  . AKI (acute kidney injury) (HCC) 06/01/2016  . ADHD (attention deficit hyperactivity disorder), combined type 01/18/2014  . Generalized anxiety disorder 01/18/2014    Past Surgical History:  Procedure Laterality Date  . APPENDECTOMY    . CARDIAC CATHETERIZATION    . TUBAL LIGATION       OB History   No obstetric history on file.     Family History  Problem Relation Age of Onset  . ADD / ADHD Mother   . Hypertension Mother   . Alcohol abuse Maternal Uncle   . Depression Maternal Grandfather   . Alcohol abuse Maternal Grandfather     Social History   Tobacco Use  . Smoking status: Current Every Day Smoker    Packs/day: 0.50    Types: Cigarettes  . Smokeless tobacco: Never Used   Vaping Use  . Vaping Use: Never used  Substance Use Topics  . Alcohol use: Yes    Comment: occasionally   . Drug use: No    Home Medications Prior to Admission medications   Medication Sig Start Date End Date Taking? Authorizing Provider  albuterol (PROVENTIL HFA;VENTOLIN HFA) 108 (90 BASE) MCG/ACT inhaler Inhale 1-2 puffs into the lungs every 6 (six) hours as needed for wheezing (Use the inhaler for the next 4 days 4 times a day then begin using only if needed for wheezing). 03/16/11 11/18/16  Annamarie Dawley, MD  ALPRAZolam Prudy Feeler) 1 MG tablet TAKE 1 TABLET(1 MG) BY MOUTH THREE TIMES DAILY 06/14/20   Myrlene Broker, MD  amLODipine (NORVASC) 10 MG tablet Take 1 tablet (10 mg total) by mouth daily. 06/03/16   Johnson, Clanford L, MD  amphetamine-dextroamphetamine (ADDERALL) 20 MG tablet Take 1 tablet (20 mg total) by mouth 2 (two) times daily. 06/14/20   Myrlene Broker, MD  amphetamine-dextroamphetamine (ADDERALL) 20 MG tablet Take 1 tablet (20 mg total) by mouth 2 (two) times daily. 06/14/20   Myrlene Broker, MD  amphetamine-dextroamphetamine (ADDERALL) 20 MG tablet Take 1 tablet (20 mg total) by mouth 2 (two) times daily. 06/14/20 06/14/21  Myrlene Broker, MD  buPROPion (WELLBUTRIN XL) 150 MG 24 hr tablet Take 1 tablet (150 mg total)  by mouth every morning. 06/14/20 06/14/21  Myrlene Broker, MD  busPIRone (BUSPAR) 10 MG tablet Take 1 tablet (10 mg total) by mouth 3 (three) times daily. 06/14/20   Myrlene Broker, MD  cyclobenzaprine (FLEXERIL) 10 MG tablet Take 1 tablet by mouth 3 (three) times daily as needed for muscle spasms.  05/26/16   [provider]  lisinopril (ZESTRIL) 20 MG tablet Take by mouth. 11/15/19   [provider]  metoprolol tartrate (LOPRESSOR) 25 MG tablet Take 1 tablet by mouth 2 (two) times daily. 05/24/20   [provider]  potassium chloride (K-DUR) 10 MEQ tablet Take 10 mEq by mouth daily.    [provider]  traZODone (DESYREL) 50 MG  tablet Take 1 tablet (50 mg total) by mouth at bedtime. 06/14/20   Myrlene Broker, MD    Allergies    Codeine  Review of Systems   Review of Systems  Constitutional: Negative for chills and fever.  HENT: Negative for congestion and sore throat.   Eyes: Negative for visual disturbance.  Respiratory: Negative for shortness of breath.   Cardiovascular: Negative for chest pain.  Gastrointestinal: Negative for abdominal pain, diarrhea, nausea and vomiting.  Genitourinary: Negative for enuresis.  Musculoskeletal: Negative for back pain.       Right wrist pain.  Skin: Negative for rash.  Neurological: Negative for dizziness and headaches.  Hematological: Does not bruise/bleed easily.    Physical Exam Updated Vital Signs BP 135/89   Pulse 85   Temp 98 F (36.7 C) (Oral)   Resp 18   Ht 5\' 3"  (1.6 m)   Wt 77.1 kg   LMP 05/25/2016   SpO2 99%   BMI 30.11 kg/m   Physical Exam Vitals and nursing note reviewed.  Constitutional:      General: She is not in acute distress.    Appearance: She is not ill-appearing.  HENT:     Head: Normocephalic and atraumatic.     Nose: No congestion.  Eyes:     Conjunctiva/sclera: Conjunctivae normal.  Cardiovascular:     Rate and Rhythm: Normal rate.  Pulmonary:     Effort: Pulmonary effort is normal.  Musculoskeletal:        General: Swelling and tenderness present. No deformity.     Comments: Patient's right wrist was visualized, nonerythematous, edema present on the anterior aspect of her wrist, no other gross abnormalities present.   She had full range of motion at her fingers, wrist, elbow, she is tender to palpation along her second and third metacarpals, no deformities or crepitus present.  Neurovascular fully intact.  Skin:    General: Skin is warm and dry.  Neurological:     Mental Status: She is alert.  Psychiatric:        Mood and Affect: Mood normal.     ED Results / Procedures / Treatments   Labs (all labs ordered are  listed, but only abnormal results are displayed) Labs Reviewed - No data to display  EKG None  Radiology DG Wrist Complete Right  Result Date: 06/24/2020 CLINICAL DATA:  Status post trauma. EXAM: RIGHT WRIST - COMPLETE 3+ VIEW COMPARISON:  None. FINDINGS: There is no evidence of fracture or dislocation. There is no evidence of arthropathy. A 3 mm x 2 mm benign-appearing sclerotic focus is seen within the right scaphoid bone. Soft tissues are unremarkable. IMPRESSION: No acute osseous abnormality. Electronically Signed   By: 06/26/2020 M.D.   On: 06/24/2020 19:48  Procedures Procedures   Medications Ordered in ED Medications - No data to display  ED Course  I have reviewed the triage vital signs and the nursing notes.  Pertinent labs & imaging results that were available during my care of the patient were reviewed by me and considered in my medical decision making (see chart for details).    MDM Rules/Calculators/A&P                          Initial impression-patient presents with right wrist pain after a mechanical fall.  She is alert, does not appear in acute distress, vital signs reassuring.  Triage ordered x-ray of wrist for further evaluation.  Work-up-x-ray negative for acute findings.  Rule out- Low suspicion for fracture or dislocation as x-ray does not feel any significant findings. low suspicion for ligament or tendon damage as area was palpated no gross defects noted, she had full range of motion at her wrist and fingers..  Low suspicion for compartment syndrome as area was palpated it was soft to the touch, neurovascular fully intact..   Plan-suspect patient is having from a muscular strain or ligament sprain will place patient in a brace and have her follow-up with hand for further evaluation.  Vital signs have remained stable, no indication for hospital admission. Patient given at home care as well strict return precautions.  Patient verbalized that they  understood agreed to said plan.   Final Clinical Impression(s) / ED Diagnoses Final diagnoses:  Right wrist pain    Rx / DC Orders ED Discharge Orders    None       Barnie Del 06/24/20 2231    Bethann Berkshire, MD 06/25/20 1152

## 2020-06-24 NOTE — Discharge Instructions (Addendum)
Seen here with wrist pain.  Imaging and exam look reassuring.  I placed you in an hand splint please wear during the day you may take off at nighttime.  I recommend keeping the wrist elevated and applying ice to the area.  You may take over-the-counter pain medications like ibuprofen and or Tylenol every 6 hours as needed please follow dosing the back of bottle.  Would like to follow-up with orthopedic surgery for further evaluation.  Please call your earliest convenience.  Come back to the emergency department if you develop chest pain, shortness of breath, severe abdominal pain, uncontrolled nausea, vomiting, diarrhea.

## 2020-06-24 NOTE — ED Triage Notes (Signed)
Pt c/o right wrist injury this morning when she fell and hyperextended her right wrist to catch herself on the ground. Denies LOC upon fall and hitting her head. Pt reports she takes Plavix.

## 2020-09-11 ENCOUNTER — Other Ambulatory Visit: Payer: Self-pay

## 2020-09-11 ENCOUNTER — Telehealth (INDEPENDENT_AMBULATORY_CARE_PROVIDER_SITE_OTHER): Payer: 59 | Admitting: Psychiatry

## 2020-09-11 ENCOUNTER — Encounter (HOSPITAL_COMMUNITY): Payer: Self-pay | Admitting: Psychiatry

## 2020-09-11 DIAGNOSIS — F902 Attention-deficit hyperactivity disorder, combined type: Secondary | ICD-10-CM | POA: Diagnosis not present

## 2020-09-11 DIAGNOSIS — F411 Generalized anxiety disorder: Secondary | ICD-10-CM | POA: Diagnosis not present

## 2020-09-11 MED ORDER — AMPHETAMINE-DEXTROAMPHETAMINE 20 MG PO TABS: 20.0000 mg | ORAL_TABLET | Freq: Two times a day (BID) | ORAL | 0 refills | Status: DC

## 2020-09-11 MED ORDER — BUPROPION HCL ER (XL) 150 MG PO TB24
150.0000 mg | ORAL_TABLET | ORAL | 2 refills | Status: DC
Start: 2020-09-11 — End: 2020-12-31

## 2020-09-11 MED ORDER — TRAZODONE HCL 50 MG PO TABS: 50.0000 mg | ORAL_TABLET | Freq: Every day | ORAL | 2 refills | Status: DC

## 2020-09-11 MED ORDER — ALPRAZOLAM 1 MG PO TABS: ORAL_TABLET | ORAL | 2 refills | Status: DC

## 2020-09-11 MED ORDER — BUSPIRONE HCL 10 MG PO TABS: 10.0000 mg | ORAL_TABLET | Freq: Three times a day (TID) | ORAL | 2 refills | Status: DC

## 2020-09-11 NOTE — Progress Notes (Signed)
Virtual Visit via Telephone Note  I connected with Jean Nelson on 09/11/20 at  1:40 PM EDT by telephone and verified that I am speaking with the correct person using two identifiers.  Location: Patient: home Provider: home office   I discussed the limitations, risks, security and privacy concerns of performing an evaluation and management service by telephone and the availability of in person appointments. I also discussed with the patient that there may be a patient responsible charge related to this service. The patient expressed understanding and agreed to proceed.   I discussed the assessment and treatment plan with the patient. The patient was provided an opportunity to ask questions and all were answered. The patient agreed with the plan and demonstrated an understanding of the instructions.   The patient was advised to call back or seek an in-person evaluation if the symptoms worsen or if the condition fails to improve as anticipated.  I provided 15 minutes of non-face-to-face time during this encounter.   [name], MD  [facility] MD/PA/NP OP Progress Note  09/11/2020 2:03 PM Jean Nelson  MRN:  [id number]  Chief Complaint:  Chief Complaint    Anxiety; Depression; ADHD     HPI: Patient is a 55 year old female who lives with her 2 grandchildren in [place]. She is currently unemployed.  The patient returns for follow-up after 3 months regarding anxiety ADHD and depression  Patient returns for follow-up after 3 months.  She states in general she is doing well.  She still has her grandchildren who are 50 and 10 living with her.  Her daughter, their mother, died last 07-16-2022 about drug overdose.  She states Mother's Day was difficult for them.  She is trying to be supportive as she can be.  Her mood is generally good good she denies significant depression and feels that the medications for anxiety is helping.  She is focusing well with the Adderall. Visit Diagnosis:     ICD-10-CM   1. ADHD (attention deficit hyperactivity disorder), combined type  F90.2   2. Generalized anxiety disorder  F41.1     Past Psychiatric History: none  Past Medical History:  Past Medical History:  Diagnosis Date  . Anxiety   . Chronic back pain   . Essential hypertension   . Heart attack (HCC)   . Plantar fasciitis   . Sciatica     Past Surgical History:  Procedure Laterality Date  . APPENDECTOMY    . CARDIAC CATHETERIZATION    . TUBAL LIGATION      Family Psychiatric History: see below  Family History:  Family History  Problem Relation Age of Onset  . ADD / ADHD Mother   . Hypertension Mother   . Alcohol abuse Maternal Uncle   . Depression Maternal Grandfather   . Alcohol abuse Maternal Grandfather     Social History:  Social History   Socioeconomic History  . Marital status: Married    Spouse name: Not on file  . Number of children: Not on file  . Years of education: Not on file  . Highest education level: Not on file  Occupational History  . Not on file  Tobacco Use  . Smoking status: Current Every Day Smoker    Packs/day: 0.50    Types: Cigarettes  . Smokeless tobacco: Never Used  Vaping Use  . Vaping Use: Never used  Substance and Sexual Activity  . Alcohol use: Yes    Comment: occasionally   . Drug use: No  .  Sexual activity: Yes    Birth control/protection: Surgical  Other Topics Concern  . Not on file  Social History Narrative  . Not on file   Social Determinants of Health   Financial Resource Strain: Not on file  Food Insecurity: Not on file  Transportation Needs: Not on file  Physical Activity: Not on file  Stress: Not on file  Social Connections: Not on file    Allergies:  Allergies  Allergen Reactions  . Codeine Hives    Metabolic Disorder Labs: No results found for: HGBA1C, MPG No results found for: PROLACTIN No results found for: CHOL, TRIG, HDL, CHOLHDL, VLDL, LDLCALC No results found for:  TSH  Therapeutic Level Labs: No results found for: LITHIUM No results found for: VALPROATE No components found for:  CBMZ  Current Medications: Current Outpatient Medications  Medication Sig Dispense Refill  . albuterol (PROVENTIL HFA;VENTOLIN HFA) 108 (90 BASE) MCG/ACT inhaler Inhale 1-2 puffs into the lungs every 6 (six) hours as needed for wheezing (Use the inhaler for the next 4 days 4 times a day then begin using only if needed for wheezing). 1 Inhaler 0  . ALPRAZolam (XANAX) 1 MG tablet TAKE 1 TABLET(1 MG) BY MOUTH THREE TIMES DAILY 90 tablet 2  . amLODipine (NORVASC) 10 MG tablet Take 1 tablet (10 mg total) by mouth daily. 30 tablet 0  . amphetamine-dextroamphetamine (ADDERALL) 20 MG tablet Take 1 tablet (20 mg total) by mouth 2 (two) times daily. 60 tablet 0  . amphetamine-dextroamphetamine (ADDERALL) 20 MG tablet Take 1 tablet (20 mg total) by mouth 2 (two) times daily. 60 tablet 0  . amphetamine-dextroamphetamine (ADDERALL) 20 MG tablet Take 1 tablet (20 mg total) by mouth 2 (two) times daily. 60 tablet 0  . buPROPion (WELLBUTRIN XL) 150 MG 24 hr tablet Take 1 tablet (150 mg total) by mouth every morning. 30 tablet 2  . busPIRone (BUSPAR) 10 MG tablet Take 1 tablet (10 mg total) by mouth 3 (three) times daily. 90 tablet 2  . cyclobenzaprine (FLEXERIL) 10 MG tablet Take 1 tablet by mouth 3 (three) times daily as needed for muscle spasms.   5  . lisinopril (ZESTRIL) 20 MG tablet Take by mouth.    . metoprolol tartrate (LOPRESSOR) 25 MG tablet Take 1 tablet by mouth 2 (two) times daily.    . potassium chloride (K-DUR) 10 MEQ tablet Take 10 mEq by mouth daily.    . traZODone (DESYREL) 50 MG tablet Take 1 tablet (50 mg total) by mouth at bedtime. 30 tablet 2   No current facility-administered medications for this visit.     Musculoskeletal: Strength & Muscle Tone: within normal limits Gait & Station: normal Patient leans: N/A  Psychiatric Specialty Exam: Review of Systems  All  other systems reviewed and are negative.   Last menstrual period 05/25/2016.There is no height or weight on file to calculate BMI.  General Appearance: NA  Eye Contact:  NA  Speech:  Clear and Coherent  Volume:  Normal  Mood:  Euthymic  Affect:  NA  Thought Process:  Goal Directed  Orientation:  Full (Time, Place, and Person)  Thought Content: WDL   Suicidal Thoughts:  No  Homicidal Thoughts:  No  Memory:  Immediate;   Good Recent;   Good Remote;   Good  Judgement:  Good  Insight:  Good  Psychomotor Activity:  Normal  Concentration:  Concentration: Good and Attention Span: Good  Recall:  Good  Fund of Knowledge: Good  Language: Good  Akathisia:  No  Handed:  Right  AIMS (if indicated): not done  Assets:  Communication Skills Desire for Improvement Physical Health Resilience Social Support Talents/Skills  ADL's:  Intact  Cognition: WNL  Sleep:  Good   Screenings: Flowsheet Row ED from 06/24/2020 in [place] EMERGENCY DEPARTMENT  C-SSRS RISK CATEGORY No Risk       Assessment and Plan: This patient is a 54 year old female with a history of ADHD depression and anxiety.  She continues to do well.  She will continue Xanax 1 mg 3 times daily for anxiety, trazodone 50 mg at bedtime for sleep, Wellbutrin XL 150 mg daily for depression, Adderall 20 mg twice daily for ADHD and BuSpar 10 mg 3 times daily for anxiety.  She will return to see me in 3 months   [name], MD 09/11/2020, 2:03 PM

## 2020-12-11 ENCOUNTER — Other Ambulatory Visit (HOSPITAL_COMMUNITY): Payer: Self-pay | Admitting: Psychiatry

## 2020-12-11 ENCOUNTER — Telehealth (HOSPITAL_COMMUNITY): Payer: Self-pay | Admitting: *Deleted

## 2020-12-11 MED ORDER — ALPRAZOLAM 1 MG PO TABS: ORAL_TABLET | ORAL | 2 refills | Status: DC

## 2020-12-11 MED ORDER — AMPHETAMINE-DEXTROAMPHETAMINE 20 MG PO TABS: 20.0000 mg | ORAL_TABLET | Freq: Two times a day (BID) | ORAL | 0 refills | Status: DC

## 2020-12-11 MED ORDER — ALPRAZOLAM 1 MG PO TABS
ORAL_TABLET | ORAL | 2 refills | Status: DC
Start: 2020-12-11 — End: 2020-12-11

## 2020-12-11 NOTE — Telephone Encounter (Signed)
sent 

## 2020-12-11 NOTE — Telephone Encounter (Signed)
Patient called stating she is needing refills for her Adderall and her Xanax.

## 2020-12-11 NOTE — Telephone Encounter (Signed)
resent

## 2020-12-11 NOTE — Telephone Encounter (Signed)
Patient pharmacy Pharmacist called and stating she was sorry and messed up.   Per pharmacist she would like for provider to please resend patient script back to them.   Per pharmacist she accidentally deleted the script when she was about to pull it out of their system to fill it.   Pharmacy is [facility]

## 2020-12-31 ENCOUNTER — Other Ambulatory Visit: Payer: Self-pay

## 2020-12-31 ENCOUNTER — Encounter (HOSPITAL_COMMUNITY): Payer: Self-pay | Admitting: Psychiatry

## 2020-12-31 ENCOUNTER — Telehealth (INDEPENDENT_AMBULATORY_CARE_PROVIDER_SITE_OTHER): Payer: 59 | Admitting: Psychiatry

## 2020-12-31 DIAGNOSIS — F902 Attention-deficit hyperactivity disorder, combined type: Secondary | ICD-10-CM | POA: Diagnosis not present

## 2020-12-31 DIAGNOSIS — F411 Generalized anxiety disorder: Secondary | ICD-10-CM

## 2020-12-31 MED ORDER — BUSPIRONE HCL 10 MG PO TABS: 10.0000 mg | ORAL_TABLET | Freq: Three times a day (TID) | ORAL | 2 refills | Status: DC

## 2020-12-31 MED ORDER — ALPRAZOLAM 1 MG PO TABS: ORAL_TABLET | ORAL | 2 refills | Status: DC

## 2020-12-31 MED ORDER — AMPHETAMINE-DEXTROAMPHETAMINE 20 MG PO TABS: 20.0000 mg | ORAL_TABLET | Freq: Two times a day (BID) | ORAL | 0 refills | Status: DC

## 2020-12-31 MED ORDER — BUPROPION HCL ER (XL) 300 MG PO TB24: 300.0000 mg | ORAL_TABLET | ORAL | 2 refills | Status: DC

## 2020-12-31 MED ORDER — TRAZODONE HCL 50 MG PO TABS: 50.0000 mg | ORAL_TABLET | Freq: Every day | ORAL | 2 refills | Status: DC

## 2020-12-31 NOTE — Progress Notes (Signed)
Virtual Visit via Telephone Note  I connected with Jean Nelson on 12/31/20 at 11:40 AM EDT by telephone and verified that I am speaking with the correct person using two identifiers.  Location: Patient: home Provider: office   I discussed the limitations, risks, security and privacy concerns of performing an evaluation and management service by telephone and the availability of in person appointments. I also discussed with the patient that there may be a patient responsible charge related to this service. The patient expressed understanding and agreed to proceed.      I discussed the assessment and treatment plan with the patient. The patient was provided an opportunity to ask questions and all were answered. The patient agreed with the plan and demonstrated an understanding of the instructions.   The patient was advised to call back or seek an in-person evaluation if the symptoms worsen or if the condition fails to improve as anticipated.  I provided 15 minutes of non-face-to-face time during this encounter.   [name], MD  [facility] MD/PA/NP OP Progress Note  12/31/2020 11:56 AM Jean Nelson  MRN:  [id number]  Chief Complaint:  Chief Complaint   Depression; Anxiety; ADD    HPI: The patient is a 55 year old female who lives with her 2 grandchildren [place].  She is currently unemployed.  The patient returns to follow-up after 3 months regarding anxiety ADHD and depression.  The patient states overall she is doing well.  She is kept very busy with her grandchildren this summer.  She states that they are asking more about her mother, her daughter who died of a drug overdose a couple of years ago.  She is not sure how to answer all other questions and I suggested that she get them back into counseling.  She is trying to be supportive.  The patient states that her mood and anxiety are under good control.  However she is still smoking a pack a day.  I offered to increase the  Wellbutrin in hopes that this will help and she agrees.  She is focusing well with the Adderall Visit Diagnosis:    ICD-10-CM   1. ADHD (attention deficit hyperactivity disorder), combined type  F90.2     2. Generalized anxiety disorder  F41.1       Past Psychiatric History: none  Past Medical History:  Past Medical History:  Diagnosis Date   Anxiety    Chronic back pain    Essential hypertension    Heart attack (HCC)    Plantar fasciitis    Sciatica     Past Surgical History:  Procedure Laterality Date   APPENDECTOMY     CARDIAC CATHETERIZATION     TUBAL LIGATION      Family Psychiatric History: see below  Family History:  Family History  Problem Relation Age of Onset   ADD / ADHD Mother    Hypertension Mother    Alcohol abuse Maternal Uncle    Depression Maternal Grandfather    Alcohol abuse Maternal Grandfather     Social History:  Social History   Socioeconomic History   Marital status: Married    Spouse name: Not on file   Number of children: Not on file   Years of education: Not on file   Highest education level: Not on file  Occupational History   Not on file  Tobacco Use   Smoking status: Every Day    Packs/day: 0.50    Types: Cigarettes   Smokeless tobacco: Never  Vaping Use   Vaping Use: Never used  Substance and Sexual Activity   Alcohol use: Yes    Comment: occasionally    Drug use: No   Sexual activity: Yes    Birth control/protection: Surgical  Other Topics Concern   Not on file  Social History Narrative   Not on file   Social Determinants of Health   Financial Resource Strain: Not on file  Food Insecurity: Not on file  Transportation Needs: Not on file  Physical Activity: Not on file  Stress: Not on file  Social Connections: Not on file    Allergies:  Allergies  Allergen Reactions   Codeine Hives    Metabolic Disorder Labs: No results found for: HGBA1C, MPG No results found for: PROLACTIN No results found for:  CHOL, TRIG, HDL, CHOLHDL, VLDL, LDLCALC No results found for: TSH  Therapeutic Level Labs: No results found for: LITHIUM No results found for: VALPROATE No components found for:  CBMZ  Current Medications: Current Outpatient Medications  Medication Sig Dispense Refill   buPROPion (WELLBUTRIN XL) 300 MG 24 hr tablet Take 1 tablet (300 mg total) by mouth every morning. 30 tablet 2   albuterol (PROVENTIL HFA;VENTOLIN HFA) 108 (90 BASE) MCG/ACT inhaler Inhale 1-2 puffs into the lungs every 6 (six) hours as needed for wheezing (Use the inhaler for the next 4 days 4 times a day then begin using only if needed for wheezing). 1 Inhaler 0   ALPRAZolam (XANAX) 1 MG tablet TAKE 1 TABLET(1 MG) BY MOUTH THREE TIMES DAILY 90 tablet 2   amLODipine (NORVASC) 10 MG tablet Take 1 tablet (10 mg total) by mouth daily. 30 tablet 0   amphetamine-dextroamphetamine (ADDERALL) 20 MG tablet Take 1 tablet (20 mg total) by mouth 2 (two) times daily. 60 tablet 0   amphetamine-dextroamphetamine (ADDERALL) 20 MG tablet Take 1 tablet (20 mg total) by mouth 2 (two) times daily. 60 tablet 0   amphetamine-dextroamphetamine (ADDERALL) 20 MG tablet Take 1 tablet (20 mg total) by mouth 2 (two) times daily. 60 tablet 0   busPIRone (BUSPAR) 10 MG tablet Take 1 tablet (10 mg total) by mouth 3 (three) times daily. 90 tablet 2   cyclobenzaprine (FLEXERIL) 10 MG tablet Take 1 tablet by mouth 3 (three) times daily as needed for muscle spasms.   5   lisinopril (ZESTRIL) 20 MG tablet Take by mouth.     metoprolol tartrate (LOPRESSOR) 25 MG tablet Take 1 tablet by mouth 2 (two) times daily.     potassium chloride (K-DUR) 10 MEQ tablet Take 10 mEq by mouth daily.     traZODone (DESYREL) 50 MG tablet Take 1 tablet (50 mg total) by mouth at bedtime. 30 tablet 2   No current facility-administered medications for this visit.     Musculoskeletal: Strength & Muscle Tone: na Gait & Station: na Patient leans: N/A  Psychiatric Specialty  Exam: Review of Systems  All other systems reviewed and are negative.  Last menstrual period 05/25/2016.There is no height or weight on file to calculate BMI.  General Appearance: NA  Eye Contact:  NA  Speech:  Clear and Coherent  Volume:  Normal  Mood:  Euthymic  Affect:  NA  Thought Process:  Goal Directed  Orientation:  Full (Time, Place, and Person)  Thought Content: WDL   Suicidal Thoughts:  No  Homicidal Thoughts:  No  Memory:  Immediate;   Good Recent;   Good Remote;   Good  Judgement:  Good  Insight:  Fair  Psychomotor Activity:  Normal  Concentration:  Concentration: Good and Attention Span: Good  Recall:  Good  Fund of Knowledge: Good  Language: Good  Akathisia:  No  Handed:  Right  AIMS (if indicated): not done  Assets:  Communication Skills Desire for Improvement Physical Health Resilience Social Support Talents/Skills  ADL's:  Intact  Cognition: WNL  Sleep:  Good   Screenings: PHQ2-9    Flowsheet Row [facility]31[facility]2020 [facility]  PHQ-2 Total Score 1      Flowsheet Row Video Visit from 12[facility]31/2020 in [facility] ED from 06/24/2020 in [facility] EMERGENCY DEPARTMENT  C-SSRS RISK CATEGORY No Risk No Risk        Assessment and Plan: This patient is a 55 year old female with a history of ADHD depression and anxiety.  For the most part she is doing well.  However we will increase Wellbutrin XL from 150 to 300 mg daily to help with depression and smoking cessation.  She will continue Xanax 1 mg 3 times daily for anxiety, trazodone 50 mg at bedtime for sleep Adderall 20 mg twice daily for ADHD and BuSpar 10 mg 3 times daily for anxiety.  She will return to see me in 3 months   [name], MD 12/31/2020, 11:56 AM

## 2021-04-04 ENCOUNTER — Telehealth (INDEPENDENT_AMBULATORY_CARE_PROVIDER_SITE_OTHER): Payer: 59 | Admitting: Psychiatry

## 2021-04-04 ENCOUNTER — Other Ambulatory Visit: Payer: Self-pay

## 2021-04-04 ENCOUNTER — Encounter (HOSPITAL_COMMUNITY): Payer: Self-pay | Admitting: Psychiatry

## 2021-04-04 DIAGNOSIS — F902 Attention-deficit hyperactivity disorder, combined type: Secondary | ICD-10-CM | POA: Diagnosis not present

## 2021-04-04 DIAGNOSIS — F411 Generalized anxiety disorder: Secondary | ICD-10-CM | POA: Diagnosis not present

## 2021-04-04 MED ORDER — ALPRAZOLAM 1 MG PO TABS: ORAL_TABLET | ORAL | 2 refills | Status: DC

## 2021-04-04 MED ORDER — BUPROPION HCL ER (XL) 300 MG PO TB24: 300.0000 mg | ORAL_TABLET | ORAL | 2 refills | Status: DC

## 2021-04-04 MED ORDER — AMPHETAMINE-DEXTROAMPHETAMINE 20 MG PO TABS: 20.0000 mg | ORAL_TABLET | Freq: Two times a day (BID) | ORAL | 0 refills | Status: DC

## 2021-04-04 MED ORDER — TRAZODONE HCL 50 MG PO TABS: 50.0000 mg | ORAL_TABLET | Freq: Every day | ORAL | 2 refills | Status: DC

## 2021-04-04 MED ORDER — BUSPIRONE HCL 10 MG PO TABS: 10.0000 mg | ORAL_TABLET | Freq: Three times a day (TID) | ORAL | 2 refills | Status: DC

## 2021-04-04 NOTE — Progress Notes (Signed)
Virtual Visit via Telephone Note  I connected with Jean Nelson on 04/04/21 at 10:00 AM EST by telephone and verified that I am speaking with the correct person using two identifiers.  Location: Patient: home Provider: home office   I discussed the limitations, risks, security and privacy concerns of performing an evaluation and management service by telephone and the availability of in person appointments. I also discussed with the patient that there may be a patient responsible charge related to this service. The patient expressed understanding and agreed to proceed.     I discussed the assessment and treatment plan with the patient. The patient was provided an opportunity to ask questions and all were answered. The patient agreed with the plan and demonstrated an understanding of the instructions.   The patient was advised to call back or seek an in-person evaluation if the symptoms worsen or if the condition fails to improve as anticipated.  I provided 14 minutes of non-face-to-face time during this encounter.   [name], MD  [facility] MD/PA/NP OP Progress Note  04/04/2021 10:14 AM Jean Nelson  MRN:  [id number]  Chief Complaint:  Chief Complaint   Anxiety; Depression; ADD; Follow-up    HPI: The patient is a 55 year old female who lives with her 2 grandchildren [place].  She is currently unemployed.  The patient returns to follow-up after 3 months regarding anxiety ADHD and depression.  The patient states that her boyfriend passed away suddenly in late to September.  He passed out suddenly while while they were at a family member's home.  EMS was called and try to revive him but failed.  She thinks he may have had a brain aneurysm or heart attack.  He was only 53.  At first she was very devastated but now she states she is doing better.  He had been living with her.  She states right now she is enjoying living alone in having her grandchildren every other week.  They go to  the other grandparents on the alternate weeks.  The patient is denying symptoms of depression or anxiety.  She thinks her medications have helped greatly.  Right now she is sleeping well.  She denies any thoughts of self-harm or suicidal ideation.  She continues to focus well and tries to "keep busy." Visit Diagnosis:    ICD-10-CM   1. ADHD (attention deficit hyperactivity disorder), combined type  F90.2     2. Generalized anxiety disorder  F41.1       Past Psychiatric History: none  Past Medical History:  Past Medical History:  Diagnosis Date   Anxiety    Chronic back pain    Essential hypertension    Heart attack (HCC)    Plantar fasciitis    Sciatica     Past Surgical History:  Procedure Laterality Date   APPENDECTOMY     CARDIAC CATHETERIZATION     TUBAL LIGATION      Family Psychiatric History: see below  Family History:  Family History  Problem Relation Age of Onset   ADD / ADHD Mother    Hypertension Mother    Alcohol abuse Maternal Uncle    Depression Maternal Grandfather    Alcohol abuse Maternal Grandfather     Social History:  Social History   Socioeconomic History   Marital status: Married    Spouse name: Not on file   Number of children: Not on file   Years of education: Not on file   Highest education level: Not  on file  Occupational History   Not on file  Tobacco Use   Smoking status: Every Day    Packs/day: 0.50    Types: Cigarettes   Smokeless tobacco: Never  Vaping Use   Vaping Use: Never used  Substance and Sexual Activity   Alcohol use: Yes    Comment: occasionally    Drug use: No   Sexual activity: Yes    Birth control/protection: Surgical  Other Topics Concern   Not on file  Social History Narrative   Not on file   Social Determinants of Health   Financial Resource Strain: Not on file  Food Insecurity: Not on file  Transportation Needs: Not on file  Physical Activity: Not on file  Stress: Not on file  Social  Connections: Not on file    Allergies:  Allergies  Allergen Reactions   Codeine Hives    Metabolic Disorder Labs: No results found for: HGBA1C, MPG No results found for: PROLACTIN No results found for: CHOL, TRIG, HDL, CHOLHDL, VLDL, LDLCALC No results found for: TSH  Therapeutic Level Labs: No results found for: LITHIUM No results found for: VALPROATE No components found for:  CBMZ  Current Medications: Current Outpatient Medications  Medication Sig Dispense Refill   albuterol (PROVENTIL HFA;VENTOLIN HFA) 108 (90 BASE) MCG/ACT inhaler Inhale 1-2 puffs into the lungs every 6 (six) hours as needed for wheezing (Use the inhaler for the next 4 days 4 times a day then begin using only if needed for wheezing). 1 Inhaler 0   ALPRAZolam (XANAX) 1 MG tablet TAKE 1 TABLET(1 MG) BY MOUTH THREE TIMES DAILY 90 tablet 2   amLODipine (NORVASC) 10 MG tablet Take 1 tablet (10 mg total) by mouth daily. 30 tablet 0   amphetamine-dextroamphetamine (ADDERALL) 20 MG tablet Take 1 tablet (20 mg total) by mouth 2 (two) times daily. 60 tablet 0   amphetamine-dextroamphetamine (ADDERALL) 20 MG tablet Take 1 tablet (20 mg total) by mouth 2 (two) times daily. 60 tablet 0   amphetamine-dextroamphetamine (ADDERALL) 20 MG tablet Take 1 tablet (20 mg total) by mouth 2 (two) times daily. 60 tablet 0   buPROPion (WELLBUTRIN XL) 300 MG 24 hr tablet Take 1 tablet (300 mg total) by mouth every morning. 30 tablet 2   busPIRone (BUSPAR) 10 MG tablet Take 1 tablet (10 mg total) by mouth 3 (three) times daily. 90 tablet 2   cyclobenzaprine (FLEXERIL) 10 MG tablet Take 1 tablet by mouth 3 (three) times daily as needed for muscle spasms.   5   lisinopril (ZESTRIL) 20 MG tablet Take by mouth.     metoprolol tartrate (LOPRESSOR) 25 MG tablet Take 1 tablet by mouth 2 (two) times daily.     potassium chloride (K-DUR) 10 MEQ tablet Take 10 mEq by mouth daily.     traZODone (DESYREL) 50 MG tablet Take 1 tablet (50 mg total) by  mouth at bedtime. 30 tablet 2   No current facility-administered medications for this visit.     Musculoskeletal: Strength & Muscle Tone: na Gait & Station: na Patient leans: N/A  Psychiatric Specialty Exam: Review of Systems  All other systems reviewed and are negative.  Last menstrual period 05/25/2016.There is no height or weight on file to calculate BMI.  General Appearance: NA  Eye Contact:  NA  Speech:  Clear and Coherent  Volume:  Normal  Mood:  Euthymic  Affect:  NA  Thought Process:  Goal Directed  Orientation:  Full (Time, Place, and Person)  Thought Content: WDL   Suicidal Thoughts:  No  Homicidal Thoughts:  No  Memory:  Immediate;   Good Recent;   Good Remote;   Good  Judgement:  Good  Insight:  Fair  Psychomotor Activity:  Normal  Concentration:  Concentration: Good and Attention Span: Good  Recall:  Good  Fund of Knowledge: Good  Language: Good  Akathisia:  No  Handed:  Right  AIMS (if indicated): not done  Assets:  Communication Skills Desire for Improvement Resilience Social Support Talents/Skills  ADL's:  Intact  Cognition: WNL  Sleep:  Good   Screenings: PHQ2-9    Flowsheet Row [facility]  PHQ-2 Total Score 1      Flowsheet Row Video Visit [facility]/31/2020 [facility] [facility] from 06/24/2020 in [facility]GENCY DEPARTMENT  C-SSRS RISK CATEGORY No Risk No Risk        Assessment and Plan: This patient is a 55 year old female with a history of ADHD depression and anxiety.  Despite the loss of her boyfriend she continues to do well on her current regimen.  She will continue Wellbutrin XL 300 mg daily for depression and smoking cessation, Xanax 1 mg 3 times daily for anxiety, trazodone 50 mg at bedtime for sleep, Adderall 20 mg twice daily for ADHD and BuSpar 10 mg 3 times daily also for anxiety.  She will return to see me in 3  months   [name], MD 04/04/2021, 10:14 AM

## 2021-06-02 ENCOUNTER — Other Ambulatory Visit (HOSPITAL_COMMUNITY): Payer: Self-pay | Admitting: Psychiatry

## 2021-06-02 ENCOUNTER — Telehealth (HOSPITAL_COMMUNITY): Payer: Self-pay | Admitting: *Deleted

## 2021-06-02 MED ORDER — AMPHETAMINE-DEXTROAMPHETAMINE 30 MG PO TABS: 30.0000 mg | ORAL_TABLET | Freq: Every morning | ORAL | 0 refills | Status: DC

## 2021-06-02 NOTE — Telephone Encounter (Signed)
30 mg qam sent in, 30 mg bid is too high given her hx of hypertension

## 2021-06-02 NOTE — Telephone Encounter (Signed)
Patient called stating that she called everywhere to see if pharmacies had her Adderall 20 mg BID and have not been successful.  Per pt she was told by [facility] in [place] that they only have 30mg  Adderall tablets in stock.   Patient is okay with switching to the 30mg  tablet of Adderall and would like for provider to please send in script.

## 2021-06-09 ENCOUNTER — Telehealth (HOSPITAL_COMMUNITY): Payer: Self-pay | Admitting: *Deleted

## 2021-06-09 ENCOUNTER — Other Ambulatory Visit (HOSPITAL_COMMUNITY): Payer: Self-pay | Admitting: Psychiatry

## 2021-06-09 MED ORDER — ALPRAZOLAM 1 MG PO TABS: ORAL_TABLET | ORAL | 2 refills | Status: DC

## 2021-06-09 MED ORDER — AMPHETAMINE-DEXTROAMPHETAMINE 20 MG PO TABS: 20.0000 mg | ORAL_TABLET | Freq: Two times a day (BID) | ORAL | 0 refills | Status: DC

## 2021-06-09 NOTE — Telephone Encounter (Signed)
Patient called stating she changed her insurance and [facility] do not take her insurance.   Per pt she is now getting her scripts filled at [facility] in [place].  Patient would like for provider to please send her Adderall and her Xanax to that new pharmacy.  Per pt the [facility] informed her that the current insurance is no longer in network with them.

## 2021-06-09 NOTE — Telephone Encounter (Signed)
sent 

## 2021-06-18 NOTE — Telephone Encounter (Signed)
Patient aware.

## 2021-07-02 ENCOUNTER — Other Ambulatory Visit: Payer: Self-pay

## 2021-07-02 ENCOUNTER — Telehealth (INDEPENDENT_AMBULATORY_CARE_PROVIDER_SITE_OTHER): Payer: Self-pay | Admitting: Psychiatry

## 2021-07-02 ENCOUNTER — Encounter (HOSPITAL_COMMUNITY): Payer: Self-pay | Admitting: Psychiatry

## 2021-07-02 DIAGNOSIS — F411 Generalized anxiety disorder: Secondary | ICD-10-CM

## 2021-07-02 DIAGNOSIS — F902 Attention-deficit hyperactivity disorder, combined type: Secondary | ICD-10-CM

## 2021-07-02 MED ORDER — AMPHETAMINE-DEXTROAMPHETAMINE 20 MG PO TABS: 20.0000 mg | ORAL_TABLET | Freq: Two times a day (BID) | ORAL | 0 refills | Status: DC

## 2021-07-02 MED ORDER — BUSPIRONE HCL 10 MG PO TABS: 10.0000 mg | ORAL_TABLET | Freq: Three times a day (TID) | ORAL | 2 refills | Status: DC

## 2021-07-02 MED ORDER — ALPRAZOLAM 1 MG PO TABS: ORAL_TABLET | ORAL | 2 refills | Status: DC

## 2021-07-02 MED ORDER — TRAZODONE HCL 50 MG PO TABS: 50.0000 mg | ORAL_TABLET | Freq: Every day | ORAL | 2 refills | Status: DC

## 2021-07-02 MED ORDER — BUPROPION HCL ER (XL) 300 MG PO TB24: 300.0000 mg | ORAL_TABLET | ORAL | 2 refills | Status: DC

## 2021-07-02 NOTE — Progress Notes (Signed)
Virtual Visit via Telephone Note  I connected with Jean Nelson on 07/02/21 at 11:20 AM EST by telephone and verified that I am speaking with the correct person using two identifiers.  Location: Patient: home Provider: office   I discussed the limitations, risks, security and privacy concerns of performing an evaluation and management service by telephone and the availability of in person appointments. I also discussed with the patient that there may be a patient responsible charge related to this service. The patient expressed understanding and agreed to proceed.       I discussed the assessment and treatment plan with the patient. The patient was provided an opportunity to ask questions and all were answered. The patient agreed with the plan and demonstrated an understanding of the instructions.   The patient was advised to call back or seek an in-person evaluation if the symptoms worsen or if the condition fails to improve as anticipated.  I provided 15 minutes of non-face-to-face time during this encounter.   Levonne Spiller, MD  [facility] MD/PA/NP OP Progress Note  07/02/2021 11:33 AM Jean Nelson  MRN:  [id number]  Chief Complaint:  Chief Complaint  Patient presents with   Depression   Anxiety   Follow-up   ADD   HPI: The patient is a 56 year old female who lives with her 2 grandchildren 40.  She is currently unemployed.  The patient returns to follow-up after 3 months regarding anxiety ADHD and depression.  The patient returns for follow-up after 3 months.  She states she was somewhat more depressed in December.  She had lost her boyfriend suddenly in September due to a brain aneurysm.  The previous year her daughter had passed away due to a drug overdose.  She feels like she is still processing these losses.  Over the last few weeks however she has been feeling better.  She and her boyfriend's mom are close and they are doing things together.  She is doing a lot of  things with her grandchildren.  For the most part her mood has been good and she denies significant anxiety and her sleep is fairly good as well.  She is focusing well with the Adderall.  She states her blood pressure is "up-and-down" but for the most part is doing well. Visit Diagnosis:    ICD-10-CM   1. ADHD (attention deficit hyperactivity disorder), combined type  F90.2     2. Generalized anxiety disorder  F41.1       Past Psychiatric History: none  Past Medical History:  Past Medical History:  Diagnosis Date   Anxiety    Chronic back pain    Essential hypertension    Heart attack ([place])    Plantar fasciitis    Sciatica     Past Surgical History:  Procedure Laterality Date   APPENDECTOMY     CARDIAC CATHETERIZATION     TUBAL LIGATION      Family Psychiatric History: see below  Family History:  Family History  Problem Relation Age of Onset   ADD / ADHD Mother    Hypertension Mother    Alcohol abuse Maternal Uncle    Depression Maternal Grandfather    Alcohol abuse Maternal Grandfather     Social History:  Social History   Socioeconomic History   Marital status: Married    Spouse name: Not on file   Number of children: Not on file   Years of education: Not on file   Highest education level: Not on file  Occupational History   Not on file  Tobacco Use   Smoking status: Every Day    Packs/day: 0.50    Types: Cigarettes   Smokeless tobacco: Never  Vaping Use   Vaping Use: Never used  Substance and Sexual Activity   Alcohol use: Yes    Comment: occasionally    Drug use: No   Sexual activity: Yes    Birth control/protection: Surgical  Other Topics Concern   Not on file  Social History Narrative   Not on file   Social Determinants of Health   Financial Resource Strain: Not on file  Food Insecurity: Not on file  Transportation Needs: Not on file  Physical Activity: Not on file  Stress: Not on file  Social Connections: Not on file    Allergies:   Allergies  Allergen Reactions   Codeine Hives    Metabolic Disorder Labs: No results found for: HGBA1C, MPG No results found for: PROLACTIN No results found for: CHOL, TRIG, HDL, CHOLHDL, VLDL, LDLCALC No results found for: TSH  Therapeutic Level Labs: No results found for: LITHIUM No results found for: VALPROATE No components found for:  CBMZ  Current Medications: Current Outpatient Medications  Medication Sig Dispense Refill   albuterol (PROVENTIL HFA;VENTOLIN HFA) 108 (90 BASE) MCG/ACT inhaler Inhale 1-2 puffs into the lungs every 6 (six) hours as needed for wheezing (Use the inhaler for the next 4 days 4 times a day then begin using only if needed for wheezing). 1 Inhaler 0   ALPRAZolam (XANAX) 1 MG tablet TAKE 1 TABLET(1 MG) BY MOUTH THREE TIMES DAILY 90 tablet 2   amLODipine (NORVASC) 10 MG tablet Take 1 tablet (10 mg total) by mouth daily. 30 tablet 0   amphetamine-dextroamphetamine (ADDERALL) 20 MG tablet Take 1 tablet (20 mg total) by mouth 2 (two) times daily. 60 tablet 0   amphetamine-dextroamphetamine (ADDERALL) 20 MG tablet Take 1 tablet (20 mg total) by mouth 2 (two) times daily. 60 tablet 0   amphetamine-dextroamphetamine (ADDERALL) 20 MG tablet Take 1 tablet (20 mg total) by mouth 2 (two) times daily. 60 tablet 0   buPROPion (WELLBUTRIN XL) 300 MG 24 hr tablet Take 1 tablet (300 mg total) by mouth every morning. 30 tablet 2   busPIRone (BUSPAR) 10 MG tablet Take 1 tablet (10 mg total) by mouth 3 (three) times daily. 90 tablet 2   cyclobenzaprine (FLEXERIL) 10 MG tablet Take 1 tablet by mouth 3 (three) times daily as needed for muscle spasms.   5   lisinopril (ZESTRIL) 20 MG tablet Take by mouth.     metoprolol tartrate (LOPRESSOR) 25 MG tablet Take 1 tablet by mouth 2 (two) times daily.     potassium chloride (K-DUR) 10 MEQ tablet Take 10 mEq by mouth daily.     traZODone (DESYREL) 50 MG tablet Take 1 tablet (50 mg total) by mouth at bedtime. 30 tablet 2   No  current facility-administered medications for this visit.     Musculoskeletal: Strength & Muscle Tone: na Gait & Station: na Patient leans: N/A  Psychiatric Specialty Exam: Review of Systems  Psychiatric/Behavioral:  The patient is nervous/anxious.   All other systems reviewed and are negative.  Last menstrual period 05/25/2016.There is no height or weight on file to calculate BMI.  General Appearance: NA  Eye Contact:  NA  Speech:  Clear and Coherent  Volume:  Normal  Mood:  Anxious and Euthymic  Affect:  NA  Thought Process:  Goal Directed  Orientation:  Full (Time, Place, and Person)  Thought Content: Rumination   Suicidal Thoughts:  No  Homicidal Thoughts:  No  Memory:  Immediate;   Good Recent;   Good Remote;   Good  Judgement:  Good  Insight:  Fair  Psychomotor Activity:  Normal  Concentration:  Concentration: Good and Attention Span: Good  Recall:  Good  Fund of Knowledge: Good  Language: Good  Akathisia:  No  Handed:  Right  AIMS (if indicated): not done  Assets:  Communication Skills Desire for Improvement Physical Health Resilience Social Support Talents/Skills  ADL's:  Intact  Cognition: WNL  Sleep:  Fair   Screenings: PHQ2-9    Flowsheet Row Video Visit from 07/02/2021 in [place] Video Visit from 12/31/2020 in [facility]  PHQ-2 Total Score 0 1      Flowsheet Row Video Visit from 07/02/2021 in [place] Video Visit from 12/31/2020 in [place] [facility] from 06/24/2020 in [place] No Risk No Risk No Risk        Assessment and Plan: This patient is a 56 year old female with a history of ADHD depression and anxiety.  She has gone through some depression related to grief but is doing better now.  She will continue Wellbutrin XL 300 mg  daily for depression and smoking cessation, Xanax 1 mg 3 times daily for anxiety, trazodone 50 mg at bedtime for sleep, Adderall 20 mg twice daily for ADHD and BuSpar 10 mg 3 times daily for anxiety.  She will return to see me in 3 months  Collaboration of Care: Collaboration of Care: Primary Care Provider AEB chart notes available to PCP through the epic system  Patient/Guardian was advised Release of Information must be obtained prior to any record release in order to collaborate their care with an outside provider. Patient/Guardian was advised if they have not already done so to contact the registration department to sign all necessary forms in order for [place] to release information regarding their care.   Consent: Patient/Guardian gives verbal consent for treatment and assignment of benefits for services provided during this visit. Patient/Guardian expressed understanding and agreed to proceed.    Levonne Spiller, MD 07/02/2021, 11:33 AM

## 2021-07-03 ENCOUNTER — Telehealth (HOSPITAL_COMMUNITY): Payer: Self-pay | Admitting: Psychiatry

## 2021-10-06 ENCOUNTER — Telehealth (INDEPENDENT_AMBULATORY_CARE_PROVIDER_SITE_OTHER): Payer: 59 | Admitting: Psychiatry

## 2021-10-06 ENCOUNTER — Encounter (HOSPITAL_COMMUNITY): Payer: Self-pay | Admitting: Psychiatry

## 2021-10-06 DIAGNOSIS — R69 Illness, unspecified: Secondary | ICD-10-CM | POA: Diagnosis not present

## 2021-10-06 DIAGNOSIS — F411 Generalized anxiety disorder: Secondary | ICD-10-CM

## 2021-10-06 DIAGNOSIS — F902 Attention-deficit hyperactivity disorder, combined type: Secondary | ICD-10-CM

## 2021-10-06 MED ORDER — BUPROPION HCL ER (XL) 300 MG PO TB24: 300.0000 mg | ORAL_TABLET | ORAL | 2 refills | Status: DC

## 2021-10-06 MED ORDER — AMPHETAMINE-DEXTROAMPHETAMINE 20 MG PO TABS: 20.0000 mg | ORAL_TABLET | Freq: Two times a day (BID) | ORAL | 0 refills | Status: DC

## 2021-10-06 MED ORDER — TRAZODONE HCL 50 MG PO TABS: 50.0000 mg | ORAL_TABLET | Freq: Every day | ORAL | 2 refills | Status: DC

## 2021-10-06 MED ORDER — BUSPIRONE HCL 10 MG PO TABS: 10.0000 mg | ORAL_TABLET | Freq: Three times a day (TID) | ORAL | 2 refills | Status: DC

## 2021-10-06 MED ORDER — ALPRAZOLAM 1 MG PO TABS: ORAL_TABLET | ORAL | 2 refills | Status: DC

## 2021-10-06 NOTE — Progress Notes (Signed)
Virtual Visit via Telephone Note  I connected with Jean Nelson on 10/06/21 at  9:40 AM EDT by telephone and verified that I am speaking with the correct person using two identifiers.  Location: Patient: home Provider: office   I discussed the limitations, risks, security and privacy concerns of performing an evaluation and management service by telephone and the availability of in person appointments. I also discussed with the patient that there may be a patient responsible charge related to this service. The patient expressed understanding and agreed to proceed.       I discussed the assessment and treatment plan with the patient. The patient was provided an opportunity to ask questions and all were answered. The patient agreed with the plan and demonstrated an understanding of the instructions.   The patient was advised to call back or seek an in-person evaluation if the symptoms worsen or if the condition fails to improve as anticipated.  I provided 20 minutes of non-face-to-face time during this encounter.   Jean Ruder, MD  [facility] MD/PA/NP OP Progress Note  10/06/2021 10:02 AM Jean Nelson  MRN:  [id number]  Chief Complaint:  Chief Complaint  Patient presents with   Anxiety   Depression   ADHD   Follow-up   HPI: The patient is a 56 year old female who lives with her 2 grandchildren [place].  She is currently unemployed.  The patient returns to follow-up after 3 months regarding anxiety ADHD and depression.  The patient states that she is doing about the same.  She is somewhat more stressed because her son is now in jail in [facility].  This was after he and his girlfriend had lots of violent arguments and he violated restraining orders repeatedly.  She states he is actually doing better in the jail and is attending anger management.  For the most part she is doing okay.  At times she gets depressed regarding the loss of her boyfriend and her daughter.   She tries to stay busy and active with her grandchildren and also with a couple of little side jobs.  Most the time she is sleeping well.  Her anxiety is under good control and she denies significant depression or suicidal ideation Visit Diagnosis:    ICD-10-CM   1. ADHD (attention deficit hyperactivity disorder), combined type  F90.2     2. Generalized anxiety disorder  F41.1       Past Psychiatric History: none  Past Medical History:  Past Medical History:  Diagnosis Date   Anxiety    Chronic back pain    Essential hypertension    Heart attack (HCC)    Plantar fasciitis    Sciatica     Past Surgical History:  Procedure Laterality Date   APPENDECTOMY     CARDIAC CATHETERIZATION     TUBAL LIGATION      Family Psychiatric History: see below  Family History:  Family History  Problem Relation Age of Onset   ADD / ADHD Mother    Hypertension Mother    Alcohol abuse Maternal Uncle    Depression Maternal Grandfather    Alcohol abuse Maternal Grandfather     Social History:  Social History   Socioeconomic History   Marital status: Married    Spouse name: Not on file   Number of children: Not on file   Years of education: Not on file   Highest education level: Not on file  Occupational History   Not on file  Tobacco Use   Smoking status: Every Day    Packs/day: 0.50    Types: Cigarettes   Smokeless tobacco: Never  Vaping Use   Vaping Use: Never used  Substance and Sexual Activity   Alcohol use: Yes    Comment: occasionally    Drug use: No   Sexual activity: Yes    Birth control/protection: Surgical  Other Topics Concern   Not on file  Social History Narrative   Not on file   Social Determinants of Health   Financial Resource Strain: Not on file  Food Insecurity: Not on file  Transportation Needs: Not on file  Physical Activity: Not on file  Stress: Not on file  Social Connections: Not on file    Allergies:  Allergies  Allergen Reactions    Codeine Hives    Metabolic Disorder Labs: No results found for: HGBA1C, MPG No results found for: PROLACTIN No results found for: CHOL, TRIG, HDL, CHOLHDL, VLDL, LDLCALC No results found for: TSH  Therapeutic Level Labs: No results found for: LITHIUM No results found for: VALPROATE No components found for:  CBMZ  Current Medications: Current Outpatient Medications  Medication Sig Dispense Refill   albuterol (PROVENTIL HFA;VENTOLIN HFA) 108 (90 BASE) MCG/ACT inhaler Inhale 1-2 puffs into the lungs every 6 (six) hours as needed for wheezing (Use the inhaler for the next 4 days 4 times a day then begin using only if needed for wheezing). 1 Inhaler 0   ALPRAZolam (XANAX) 1 MG tablet TAKE 1 TABLET(1 MG) BY MOUTH THREE TIMES DAILY 90 tablet 2   amLODipine (NORVASC) 10 MG tablet Take 1 tablet (10 mg total) by mouth daily. 30 tablet 0   amphetamine-dextroamphetamine (ADDERALL) 20 MG tablet Take 1 tablet (20 mg total) by mouth 2 (two) times daily. 60 tablet 0   amphetamine-dextroamphetamine (ADDERALL) 20 MG tablet Take 1 tablet (20 mg total) by mouth 2 (two) times daily. 60 tablet 0   amphetamine-dextroamphetamine (ADDERALL) 20 MG tablet Take 1 tablet (20 mg total) by mouth 2 (two) times daily. 60 tablet 0   buPROPion (WELLBUTRIN XL) 300 MG 24 hr tablet Take 1 tablet (300 mg total) by mouth every morning. 30 tablet 2   busPIRone (BUSPAR) 10 MG tablet Take 1 tablet (10 mg total) by mouth 3 (three) times daily. 90 tablet 2   cyclobenzaprine (FLEXERIL) 10 MG tablet Take 1 tablet by mouth 3 (three) times daily as needed for muscle spasms.   5   lisinopril (ZESTRIL) 20 MG tablet Take by mouth.     metoprolol tartrate (LOPRESSOR) 25 MG tablet Take 1 tablet by mouth 2 (two) times daily.     potassium chloride (K-DUR) 10 MEQ tablet Take 10 mEq by mouth daily.     traZODone (DESYREL) 50 MG tablet Take 1 tablet (50 mg total) by mouth at bedtime. 30 tablet 2   No current facility-administered medications  for this visit.     Musculoskeletal: Strength & Muscle Tone: within normal limits Gait & Station: normal Patient leans: N/A  Psychiatric Specialty Exam: Review of Systems  Musculoskeletal:  Positive for back pain.  All other systems reviewed and are negative.  Last menstrual period 05/25/2016.There is no height or weight on file to calculate BMI.  General Appearance: NA  Eye Contact:  NA  Speech:  Clear and Coherent  Volume:  Normal  Mood:  Euthymic  Affect:  NA  Thought Process:  Goal Directed  Orientation:  Full (Time, Place, and Person)  Thought Content:  WDL and Rumination   Suicidal Thoughts:  No  Homicidal Thoughts:  No  Memory:  Immediate;   Good Recent;   Good Remote;   Good  Judgement:  Good  Insight:  Good  Psychomotor Activity:  Normal  Concentration:  Concentration: Good and Attention Span: Good  Recall:  Good  Fund of Knowledge: Good  Language: Good  Akathisia:  No  Handed:  Right  AIMS (if indicated): not done  Assets:  Communication Skills Desire for Improvement Physical Health Resilience Social Support Talents/Skills  ADL's:  Intact  Cognition: WNL  Sleep:  Good   Screenings: PHQ2-9    Flowsheet Row [facility] Video Visit from 07/02/2021 in [facility] Video Visit from 12/31/2020 in [facility]  PHQ-2 Total Score 1 0 1      Flowsheet Row Video Visit from 10/06/2021 in [facility] Video Visit from 07[facility]02/2021 in [facility] Video Visit from 12/31/2020 in [facility]  C-SSRS RISK CATEGORY No Risk No Risk No Risk        Assessment and Plan: This patient is a 56 year old female with a history of ADHD depression and anxiety.  Overall she is doing fairly well.  She will  continue Wellbutrin XL 300 mg daily for depression, Xanax 1 mg 3 times daily for anxiety, trazodone 50 mg at bedtime for sleep, BuSpar 10 mg 3 times daily for anxiety and Adderall 20 mg twice daily for ADHD.  She will return to see me in 3 months  Collaboration of Care: Collaboration of Care: Primary Care Provider AEB notes will be made available to PCP at patient's request  Patient/Guardian was advised Release of Information must be obtained prior to any record release in order to collaborate their care with an outside provider. Patient/Guardian was advised if they have not already done so to contact the registration department to sign all necessary forms in order for us to release information regarding their care.   Consent: Patient/Guardian gives verbal consent for treatment and assignment of benefits for services provided during this visit. Patient/Guardian expressed understanding and agreed to proceed.    Jean Rudereborah Cherokee Clowers, MD 10/06/2021, 10:02 AM

## 2021-12-25 ENCOUNTER — Other Ambulatory Visit (HOSPITAL_COMMUNITY): Payer: Self-pay | Admitting: Psychiatry

## 2022-01-06 ENCOUNTER — Telehealth (INDEPENDENT_AMBULATORY_CARE_PROVIDER_SITE_OTHER): Payer: 59 | Admitting: Psychiatry

## 2022-01-06 ENCOUNTER — Encounter (HOSPITAL_COMMUNITY): Payer: Self-pay | Admitting: Psychiatry

## 2022-01-06 DIAGNOSIS — R69 Illness, unspecified: Secondary | ICD-10-CM | POA: Diagnosis not present

## 2022-01-06 DIAGNOSIS — F411 Generalized anxiety disorder: Secondary | ICD-10-CM

## 2022-01-06 DIAGNOSIS — F902 Attention-deficit hyperactivity disorder, combined type: Secondary | ICD-10-CM

## 2022-01-06 MED ORDER — AMPHETAMINE-DEXTROAMPHETAMINE 20 MG PO TABS: 20.0000 mg | ORAL_TABLET | Freq: Two times a day (BID) | ORAL | 0 refills | Status: DC

## 2022-01-06 MED ORDER — BUSPIRONE HCL 10 MG PO TABS: 10.0000 mg | ORAL_TABLET | Freq: Three times a day (TID) | ORAL | 2 refills | Status: DC

## 2022-01-06 MED ORDER — BUPROPION HCL ER (XL) 300 MG PO TB24: 300.0000 mg | ORAL_TABLET | ORAL | 2 refills | Status: DC

## 2022-01-06 MED ORDER — TRAZODONE HCL 50 MG PO TABS
ORAL_TABLET | ORAL | 1 refills | Status: DC
Start: 2022-01-06 — End: 2022-04-16

## 2022-01-06 MED ORDER — ALPRAZOLAM 1 MG PO TABS: ORAL_TABLET | ORAL | 2 refills | Status: DC

## 2022-01-06 NOTE — Progress Notes (Signed)
Virtual Visit via Telephone Note  I connected with Jean Nelson on 01/06/22 at  9:20 AM EDT by telephone and verified that I am speaking with the correct person using two identifiers.  Location: Patient: home Provider: office   I discussed the limitations, risks, security and privacy concerns of performing an evaluation and management service by telephone and the availability of in person appointments. I also discussed with the patient that there may be a patient responsible charge related to this service. The patient expressed understanding and agreed to proceed.     I discussed the assessment and treatment plan with the patient. The patient was provided an opportunity to ask questions and all were answered. The patient agreed with the plan and demonstrated an understanding of the instructions.   The patient was advised to call back or seek an in-person evaluation if the symptoms worsen or if the condition fails to improve as anticipated.  I provided 15 minutes of non-face-to-face time during this encounter.   [name], MD  [facility] MD/PA/NP OP Progress Note  01/06/2022 9:37 AM Jean Nelson  MRN:  [id number]  Chief Complaint:  Chief Complaint  Patient presents with   Depression   Anxiety   Follow-up   HPI: This patient is a 56 year old white female who lives with her 2 grandchildren [place].  She is currently unemployed.  The patient returns for follow-up after 3 months regarding anxiety ADHD and depression.  The patient states in general she is doing okay.  She states that her son and his girlfriend had a baby and they are having conflicts over where to live.  This is caused her to be more stressed.  Her blood pressure is up-and-down and she continues to check it at home.  She also complains of chest pain when she first awakens and has trouble breathing.  She is going to see her PCP regarding this soon.  She does think her medications for depression and anxiety are  working pretty well.  She tried to stop the BuSpar for a while but she found when she did she was using more Xanax.  She is still smoking about a pack a day and is trying to bring this down.  She declined the use of Chantix for now.  Most the time she is sleeping well. Visit Diagnosis:    ICD-10-CM   1. ADHD (attention deficit hyperactivity disorder), combined type  F90.2     2. Generalized anxiety disorder  F41.1       Past Psychiatric History: none  Past Medical History:  Past Medical History:  Diagnosis Date   Anxiety    Chronic back pain    Essential hypertension    Heart attack (HCC)    Plantar fasciitis    Sciatica     Past Surgical History:  Procedure Laterality Date   APPENDECTOMY     CARDIAC CATHETERIZATION     TUBAL LIGATION      Family Psychiatric History: See below  Family History:  Family History  Problem Relation Age of Onset   ADD / ADHD Mother    Hypertension Mother    Alcohol abuse Maternal Uncle    Depression Maternal Grandfather    Alcohol abuse Maternal Grandfather     Social History:  Social History   Socioeconomic History   Marital status: Married    Spouse name: Not on file   Number of children: Not on file   Years of education: Not on file   Highest  education level: Not on file  Occupational History   Not on file  Tobacco Use   Smoking status: Every Day    Packs/day: 0.50    Types: Cigarettes   Smokeless tobacco: Never  Vaping Use   Vaping Use: Never used  Substance and Sexual Activity   Alcohol use: Yes    Comment: occasionally    Drug use: No   Sexual activity: Yes    Birth control/protection: Surgical  Other Topics Concern   Not on file  Social History Narrative   Not on file   Social Determinants of Health   Financial Resource Strain: Not on file  Food Insecurity: Not on file  Transportation Needs: Not on file  Physical Activity: Not on file  Stress: Not on file  Social Connections: Not on file    Allergies:   Allergies  Allergen Reactions   Codeine Hives    Metabolic Disorder Labs: No results found for: "HGBA1C", "MPG" No results found for: "PROLACTIN" No results found for: "CHOL", "TRIG", "HDL", "CHOLHDL", "VLDL", "LDLCALC" No results found for: "TSH"  Therapeutic Level Labs: No results found for: "LITHIUM" No results found for: "VALPROATE" No results found for: "CBMZ"  Current Medications: Current Outpatient Medications  Medication Sig Dispense Refill   albuterol (PROVENTIL HFA;VENTOLIN HFA) 108 (90 BASE) MCG/ACT inhaler Inhale 1-2 puffs into the lungs every 6 (six) hours as needed for wheezing (Use the inhaler for the next 4 days 4 times a day then begin using only if needed for wheezing). 1 Inhaler 0   ALPRAZolam (XANAX) 1 MG tablet TAKE 1 TABLET(1 MG) BY MOUTH THREE TIMES DAILY 90 tablet 2   amLODipine (NORVASC) 10 MG tablet Take 1 tablet (10 mg total) by mouth daily. 30 tablet 0   amphetamine-dextroamphetamine (ADDERALL) 20 MG tablet Take 1 tablet (20 mg total) by mouth 2 (two) times daily. 60 tablet 0   amphetamine-dextroamphetamine (ADDERALL) 20 MG tablet Take 1 tablet (20 mg total) by mouth 2 (two) times daily. 60 tablet 0   amphetamine-dextroamphetamine (ADDERALL) 20 MG tablet Take 1 tablet (20 mg total) by mouth 2 (two) times daily. 60 tablet 0   atorvastatin (LIPITOR) 80 MG tablet Take 80 mg by mouth daily.     buPROPion (WELLBUTRIN XL) 300 MG 24 hr tablet Take 1 tablet (300 mg total) by mouth every morning. 30 tablet 2   busPIRone (BUSPAR) 10 MG tablet Take 1 tablet (10 mg total) by mouth 3 (three) times daily. 90 tablet 2   cyclobenzaprine (FLEXERIL) 10 MG tablet Take 1 tablet by mouth 3 (three) times daily as needed for muscle spasms.   5   lisinopril (ZESTRIL) 20 MG tablet Take by mouth.     metoprolol tartrate (LOPRESSOR) 25 MG tablet Take 1 tablet by mouth 2 (two) times daily.     potassium chloride (K-DUR) 10 MEQ tablet Take 10 mEq by mouth daily.     traZODone  (DESYREL) 50 MG tablet TAKE 1 TABLET BY MOUTH EVERYDAY AT BEDTIME 90 tablet 1   No current facility-administered medications for this visit.     Musculoskeletal: Strength & Muscle Tone: na Gait & Station: na Patient leans: N/A  Psychiatric Specialty Exam: Review of Systems  Respiratory:  Positive for chest tightness.   All other systems reviewed and are negative.   Last menstrual period 05/25/2016.There is no height or weight on file to calculate BMI.  General Appearance: NA  Eye Contact:  NA  Speech:  Clear and Coherent  Volume:  Normal  Mood:  Euthymic  Affect:  NA  Thought Process:  Goal Directed  Orientation:  Full (Time, Place, and Person)  Thought Content: WDL   Suicidal Thoughts:  No  Homicidal Thoughts:  No  Memory:  Immediate;   Good Recent;   Good Remote;   Good  Judgement:  Good  Insight:  Fair  Psychomotor Activity:  Normal  Concentration:  Concentration: Good and Attention Span: Good  Recall:  Good  Fund of Knowledge: Good  Language: Good  Akathisia:  No  Handed:  Right  AIMS (if indicated): not done  Assets:  Communication Skills Desire for Improvement Resilience Social Support Talents/Skills  ADL's:  Intact  Cognition: WNL  Sleep:  Good   Screenings: PHQ2-9    Flowsheet Row [facility] from [facility]2022 [facility] Video Visit from 10/06/2021 in [facility] Video Visit from 07[facility]02/2021 in [facility] Video Visit from 12[facility]31/2020 in [facility]  PHQ-2 Total Score 0 1 0 1      Flowsheet Row Video Visit from 01/06/2022 in [facility] Video Visit from 10/06/2021 in [facility] Video Visit from 07/02/2021 in [facility]  C-SSRS RISK CATEGORY No Risk No Risk No Risk         Assessment and Plan: This patient is a 56 year old female with a history of ADHD depression and anxiety.  She continues to do fairly well.  She will continue Wellbutrin XL 300 mg daily for depression, Xanax 1 mg 3 times daily for anxiety, trazodone 50 mg at bedtime for sleep, BuSpar 10 mg 3 times daily for anxiety and Adderall 20 mg twice daily for ADHD.  She will return to see me in 3 months  Collaboration of Care: Collaboration of Care: Primary Care Provider AEB notes are shared with PCP on the epic system  Patient/Guardian was advised Release of Information must be obtained prior to any record release in order to collaborate their care with an outside provider. Patient/Guardian was advised if they have not already done so to contact the registration department to sign all necessary forms in order for [place] to release information regarding their care.   Consent: Patient/Guardian gives verbal consent for treatment and assignment of benefits for services provided during this visit. Patient/Guardian expressed understanding and agreed to proceed.    [name], MD 01/06/2022, 9:37 AM

## 2022-01-08 ENCOUNTER — Other Ambulatory Visit: Payer: Self-pay

## 2022-01-08 ENCOUNTER — Inpatient Hospital Stay (HOSPITAL_COMMUNITY): Payer: 59

## 2022-01-08 ENCOUNTER — Other Ambulatory Visit (HOSPITAL_COMMUNITY): Payer: Self-pay

## 2022-01-08 ENCOUNTER — Inpatient Hospital Stay (HOSPITAL_COMMUNITY)
Admission: EM | Admit: 2022-01-08 | Discharge: 2022-01-09 | DRG: 251 | Disposition: A | Discharge status: Home / Self Care | Payer: 59 | Attending: Interventional Cardiology | Admitting: Interventional Cardiology

## 2022-01-08 ENCOUNTER — Emergency Department (HOSPITAL_COMMUNITY): Payer: 59

## 2022-01-08 ENCOUNTER — Encounter (HOSPITAL_COMMUNITY): Payer: Self-pay | Admitting: Emergency Medicine

## 2022-01-08 ENCOUNTER — Telehealth (HOSPITAL_COMMUNITY): Payer: Self-pay

## 2022-01-08 ENCOUNTER — Encounter (HOSPITAL_COMMUNITY)
Admission: EM | Disposition: A | Discharge status: Home / Self Care | Payer: Self-pay | Source: Home / Self Care | Attending: Interventional Cardiology

## 2022-01-08 DIAGNOSIS — M5430 Sciatica, unspecified side: Secondary | ICD-10-CM | POA: Diagnosis present

## 2022-01-08 DIAGNOSIS — E876 Hypokalemia: Secondary | ICD-10-CM | POA: Diagnosis not present

## 2022-01-08 DIAGNOSIS — I5022 Chronic systolic (congestive) heart failure: Secondary | ICD-10-CM | POA: Diagnosis present

## 2022-01-08 DIAGNOSIS — Z8249 Family history of ischemic heart disease and other diseases of the circulatory system: Secondary | ICD-10-CM

## 2022-01-08 DIAGNOSIS — Z7982 Long term (current) use of aspirin: Secondary | ICD-10-CM | POA: Diagnosis not present

## 2022-01-08 DIAGNOSIS — I10 Essential (primary) hypertension: Secondary | ICD-10-CM

## 2022-01-08 DIAGNOSIS — Z9861 Coronary angioplasty status: Secondary | ICD-10-CM

## 2022-01-08 DIAGNOSIS — Z79899 Other long term (current) drug therapy: Secondary | ICD-10-CM | POA: Diagnosis not present

## 2022-01-08 DIAGNOSIS — I213 ST elevation (STEMI) myocardial infarction of unspecified site: Principal | ICD-10-CM

## 2022-01-08 DIAGNOSIS — E785 Hyperlipidemia, unspecified: Secondary | ICD-10-CM | POA: Diagnosis not present

## 2022-01-08 DIAGNOSIS — Z20822 Contact with and (suspected) exposure to covid-19: Secondary | ICD-10-CM | POA: Diagnosis present

## 2022-01-08 DIAGNOSIS — F17210 Nicotine dependence, cigarettes, uncomplicated: Secondary | ICD-10-CM | POA: Diagnosis present

## 2022-01-08 DIAGNOSIS — F419 Anxiety disorder, unspecified: Secondary | ICD-10-CM | POA: Diagnosis not present

## 2022-01-08 DIAGNOSIS — I110 Hypertensive heart disease with heart failure: Secondary | ICD-10-CM | POA: Diagnosis not present

## 2022-01-08 DIAGNOSIS — I2510 Atherosclerotic heart disease of native coronary artery without angina pectoris: Secondary | ICD-10-CM | POA: Diagnosis not present

## 2022-01-08 DIAGNOSIS — Z811 Family history of alcohol abuse and dependence: Secondary | ICD-10-CM

## 2022-01-08 DIAGNOSIS — Z716 Tobacco abuse counseling: Secondary | ICD-10-CM

## 2022-01-08 DIAGNOSIS — Z743 Need for continuous supervision: Secondary | ICD-10-CM | POA: Diagnosis not present

## 2022-01-08 DIAGNOSIS — I2102 ST elevation (STEMI) myocardial infarction involving left anterior descending coronary artery: Secondary | ICD-10-CM

## 2022-01-08 DIAGNOSIS — Z720 Tobacco use: Secondary | ICD-10-CM

## 2022-01-08 DIAGNOSIS — R52 Pain, unspecified: Secondary | ICD-10-CM | POA: Diagnosis not present

## 2022-01-08 DIAGNOSIS — R079 Chest pain, unspecified: Secondary | ICD-10-CM

## 2022-01-08 DIAGNOSIS — G8929 Other chronic pain: Secondary | ICD-10-CM | POA: Diagnosis not present

## 2022-01-08 DIAGNOSIS — E782 Mixed hyperlipidemia: Secondary | ICD-10-CM | POA: Diagnosis not present

## 2022-01-08 DIAGNOSIS — R69 Illness, unspecified: Secondary | ICD-10-CM | POA: Diagnosis not present

## 2022-01-08 DIAGNOSIS — Z7902 Long term (current) use of antithrombotics/antiplatelets: Secondary | ICD-10-CM | POA: Diagnosis not present

## 2022-01-08 DIAGNOSIS — I2109 ST elevation (STEMI) myocardial infarction involving other coronary artery of anterior wall: Principal | ICD-10-CM

## 2022-01-08 DIAGNOSIS — Z818 Family history of other mental and behavioral disorders: Secondary | ICD-10-CM

## 2022-01-08 DIAGNOSIS — I252 Old myocardial infarction: Secondary | ICD-10-CM | POA: Diagnosis not present

## 2022-01-08 HISTORY — PX: CORONARY/GRAFT ACUTE MI REVASCULARIZATION: CATH118305

## 2022-01-08 HISTORY — PX: LEFT HEART CATH AND CORONARY ANGIOGRAPHY: CATH118249

## 2022-01-08 LAB — ECHOCARDIOGRAM COMPLETE
AR max vel: 1.97 cm2
AV Area VTI: 1.81 cm2
AV Area mean vel: 1.95 cm2
AV Mean grad: 4 mmHg
AV Peak grad: 8.1 mmHg
Ao pk vel: 1.42 m/s
Area-P 1/2: 3.5 cm2
Height: 63 in
S' Lateral: 2.9 cm
Weight: 2800 oz

## 2022-01-08 LAB — COMPREHENSIVE METABOLIC PANEL
ALT: 18 U/L (ref 0–44)
ALT: 17 U/L (ref 0–44)
AST: 19 U/L (ref 15–41)
AST: 32 U/L (ref 15–41)
Albumin: 3.9 g/dL (ref 3.5–5.0)
Albumin: 3.4 g/dL — ABNORMAL LOW (ref 3.5–5.0)
Alkaline Phosphatase: 105 U/L (ref 38–126)
Alkaline Phosphatase: 89 U/L (ref 38–126)
Anion gap: 8 (ref 5–15)
Anion gap: 12 (ref 5–15)
BUN: 9 mg/dL (ref 6–20)
BUN: 6 mg/dL (ref 6–20)
CO2: 24 mmol/L (ref 22–32)
CO2: 22 mmol/L (ref 22–32)
Calcium: 8.9 mg/dL (ref 8.9–10.3)
Calcium: 9.1 mg/dL (ref 8.9–10.3)
Chloride: 109 mmol/L (ref 98–111)
Chloride: 108 mmol/L (ref 98–111)
Creatinine, Ser: 0.71 mg/dL (ref 0.44–1.00)
Creatinine, Ser: 0.59 mg/dL (ref 0.44–1.00)
GFR, Estimated: >60 mL/min (ref >60)
GFR, Estimated: >60 mL/min (ref >60)
Glucose, Bld: 121 mg/dL — ABNORMAL HIGH (ref 70–99)
Glucose, Bld: 121 mg/dL — ABNORMAL HIGH (ref 70–99)
Potassium: 2.7 mmol/L — CL (ref 3.5–5.1)
Potassium: 3.1 mmol/L — ABNORMAL LOW (ref 3.5–5.1)
Sodium: 141 mmol/L (ref 135–145)
Sodium: 142 mmol/L (ref 135–145)
Total Bilirubin: 0.7 mg/dL (ref 0.3–1.2)
Total Bilirubin: 0.4 mg/dL (ref 0.3–1.2)
Total Protein: 7.6 g/dL (ref 6.5–8.1)
Total Protein: 6.5 g/dL (ref 6.5–8.1)

## 2022-01-08 LAB — RESP PANEL BY RT-PCR (FLU A&B, COVID) ARPGX2
Influenza A by PCR: NEGATIVE
Influenza B by PCR: NEGATIVE
SARS Coronavirus 2 by RT PCR: NEGATIVE

## 2022-01-08 LAB — HEMOGLOBIN A1C
Hgb A1c MFr Bld: 5.6 % (ref 4.8–5.6)
Hgb A1c MFr Bld: 5.6 % (ref 4.8–5.6)
Mean Plasma Glucose: 114.02 mg/dL
Mean Plasma Glucose: 114.02 mg/dL

## 2022-01-08 LAB — GLUCOSE, CAPILLARY: Glucose-Capillary: 102 mg/dL — ABNORMAL HIGH (ref 70–99)

## 2022-01-08 LAB — LIPID PANEL
Cholesterol: 121 mg/dL (ref 0–200)
Cholesterol: 115 mg/dL (ref 0–200)
HDL: 35 mg/dL — ABNORMAL LOW (ref >40)
HDL: 34 mg/dL — ABNORMAL LOW (ref >40)
LDL Cholesterol: 34 mg/dL (ref 0–99)
LDL Cholesterol: 49 mg/dL (ref 0–99)
Total CHOL/HDL Ratio: 3.5 RATIO
Total CHOL/HDL Ratio: 3.4 RATIO
Triglycerides: 260 mg/dL — ABNORMAL HIGH (ref <150)
Triglycerides: 159 mg/dL — ABNORMAL HIGH (ref <150)
VLDL: 52 mg/dL — ABNORMAL HIGH (ref 0–40)
VLDL: 32 mg/dL (ref 0–40)

## 2022-01-08 LAB — CBC
HCT: 41.9 % (ref 36.0–46.0)
HCT: 38.9 % (ref 36.0–46.0)
Hemoglobin: 14 g/dL (ref 12.0–15.0)
Hemoglobin: 13 g/dL (ref 12.0–15.0)
MCH: 30.6 pg (ref 26.0–34.0)
MCH: 30.3 pg (ref 26.0–34.0)
MCHC: 33.4 g/dL (ref 30.0–36.0)
MCHC: 33.4 g/dL (ref 30.0–36.0)
MCV: 91.5 fL (ref 80.0–100.0)
MCV: 90.7 fL (ref 80.0–100.0)
Platelets: 220 10*3/uL (ref 150–400)
Platelets: 226 10*3/uL (ref 150–400)
RBC: 4.58 MIL/uL (ref 3.87–5.11)
RBC: 4.29 MIL/uL (ref 3.87–5.11)
RDW: 13.9 % (ref 11.5–15.5)
RDW: 13.8 % (ref 11.5–15.5)
WBC: 8.9 10*3/uL (ref 4.0–10.5)
WBC: 8.6 10*3/uL (ref 4.0–10.5)
nRBC: 0 % (ref 0.0–0.2)
nRBC: 0 % (ref 0.0–0.2)

## 2022-01-08 LAB — TROPONIN I (HIGH SENSITIVITY)
Troponin I (High Sensitivity): 8 ng/L (ref <18)
Troponin I (High Sensitivity): 2010 ng/L (ref <18)

## 2022-01-08 LAB — PROTIME-INR
INR: 1 (ref 0.8–1.2)
Prothrombin Time: 12.6 seconds (ref 11.4–15.2)

## 2022-01-08 LAB — POCT ACTIVATED CLOTTING TIME
Activated Clotting Time: 329 seconds
Activated Clotting Time: 155 seconds

## 2022-01-08 LAB — HIV ANTIBODY (ROUTINE TESTING W REFLEX): HIV Screen 4th Generation wRfx: NONREACTIVE

## 2022-01-08 LAB — MRSA NEXT GEN BY PCR, NASAL: MRSA by PCR Next Gen: NOT DETECTED

## 2022-01-08 LAB — APTT: aPTT: 28 seconds (ref 24–36)

## 2022-01-08 LAB — MAGNESIUM: Magnesium: 1.6 mg/dL — ABNORMAL LOW (ref 1.7–2.4)

## 2022-01-08 SURGERY — CORONARY/GRAFT ACUTE MI REVASCULARIZATION
Anesthesia: LOCAL

## 2022-01-08 MED ORDER — ONDANSETRON HCL 4 MG/2ML IJ SOLN: 4.0000 mg | Freq: Four times a day (QID) | INTRAMUSCULAR | Status: DC | PRN

## 2022-01-08 MED ORDER — MIDAZOLAM HCL 2 MG/2ML IJ SOLN
INTRAMUSCULAR | Status: AC
  Filled 2022-01-08: qty 2

## 2022-01-08 MED ORDER — SODIUM CHLORIDE 0.9 % IV SOLN: INTRAVENOUS | Status: DC

## 2022-01-08 MED ORDER — HEPARIN SODIUM (PORCINE) 5000 UNIT/ML IJ SOLN: 5000.0000 [IU] | Freq: Three times a day (TID) | INTRAMUSCULAR | Status: DC

## 2022-01-08 MED ORDER — ROSUVASTATIN CALCIUM 20 MG PO TABS
40.0000 mg | ORAL_TABLET | Freq: Every day | ORAL | Status: DC
  Administered 2022-01-08 – 2022-01-09 (×2): 40 mg via ORAL
  Filled 2022-01-08 (×2): qty 2

## 2022-01-08 MED ORDER — SODIUM CHLORIDE 0.9% FLUSH: 3.0000 mL | INTRAVENOUS | Status: DC | PRN

## 2022-01-08 MED ORDER — NITROGLYCERIN 0.4 MG SL SUBL: 0.4000 mg | SUBLINGUAL_TABLET | SUBLINGUAL | Status: DC | PRN

## 2022-01-08 MED ORDER — HYDRALAZINE HCL 20 MG/ML IJ SOLN
INTRAMUSCULAR | Status: AC
  Filled 2022-01-08: qty 1

## 2022-01-08 MED ORDER — MIDAZOLAM HCL 2 MG/2ML IJ SOLN
INTRAMUSCULAR | Status: DC | PRN
  Administered 2022-01-08: 1 mg via INTRAVENOUS

## 2022-01-08 MED ORDER — ACETAMINOPHEN 325 MG PO TABS
650.0000 mg | ORAL_TABLET | ORAL | Status: DC | PRN
  Administered 2022-01-08 – 2022-01-09 (×2): 650 mg via ORAL
  Filled 2022-01-08 (×2): qty 2

## 2022-01-08 MED ORDER — NITROGLYCERIN 0.4 MG SL SUBL
0.4000 mg | SUBLINGUAL_TABLET | SUBLINGUAL | Status: DC | PRN
  Administered 2022-01-08: 0.4 mg via SUBLINGUAL

## 2022-01-08 MED ORDER — POTASSIUM CHLORIDE CRYS ER 10 MEQ PO TBCR
40.0000 meq | EXTENDED_RELEASE_TABLET | Freq: Once | ORAL | Status: AC
Start: 2022-01-08 — End: 2022-01-08
  Administered 2022-01-08: 40 meq via ORAL
  Filled 2022-01-08: qty 4

## 2022-01-08 MED ORDER — ASPIRIN 81 MG PO CHEW
CHEWABLE_TABLET | ORAL | Status: AC
  Filled 2022-01-08: qty 1

## 2022-01-08 MED ORDER — ALPRAZOLAM 0.25 MG PO TABS
0.2500 mg | ORAL_TABLET | Freq: Two times a day (BID) | ORAL | Status: DC | PRN
Start: 2022-01-08 — End: 2022-01-09

## 2022-01-08 MED ORDER — HEPARIN SODIUM (PORCINE) 1000 UNIT/ML IJ SOLN
INTRAMUSCULAR | Status: DC | PRN
  Administered 2022-01-08: 8000 [IU] via INTRAVENOUS

## 2022-01-08 MED ORDER — METOPROLOL TARTRATE 25 MG PO TABS
25.0000 mg | ORAL_TABLET | Freq: Two times a day (BID) | ORAL | Status: DC
Start: 2022-01-08 — End: 2022-01-08

## 2022-01-08 MED ORDER — TRAZODONE HCL 50 MG PO TABS
50.0000 mg | ORAL_TABLET | Freq: Every evening | ORAL | Status: DC | PRN
Start: 2022-01-08 — End: 2022-01-09

## 2022-01-08 MED ORDER — TICAGRELOR 90 MG PO TABS
ORAL_TABLET | ORAL | Status: DC | PRN
  Administered 2022-01-08: 180 mg via ORAL

## 2022-01-08 MED ORDER — MORPHINE SULFATE (PF) 4 MG/ML IV SOLN
4.0000 mg | Freq: Once | INTRAVENOUS | Status: AC
  Administered 2022-01-08: 4 mg via INTRAVENOUS
  Filled 2022-01-08: qty 1

## 2022-01-08 MED ORDER — ASPIRIN 81 MG PO CHEW
324.0000 mg | CHEWABLE_TABLET | Freq: Once | ORAL | Status: AC
Start: 2022-01-08 — End: 2022-01-08
  Administered 2022-01-08: 324 mg via ORAL
  Filled 2022-01-08: qty 4

## 2022-01-08 MED ORDER — ASPIRIN 300 MG RE SUPP
300.0000 mg | RECTAL | Status: DC
  Filled 2022-01-08: qty 1

## 2022-01-08 MED ORDER — ATROPINE SULFATE 1 MG/10ML IJ SOSY
PREFILLED_SYRINGE | INTRAMUSCULAR | Status: DC
Start: 2022-01-08 — End: 2022-01-08
  Filled 2022-01-08: qty 10

## 2022-01-08 MED ORDER — NITROGLYCERIN 0.4 MG SL SUBL
SUBLINGUAL_TABLET | SUBLINGUAL | Status: AC
  Filled 2022-01-08: qty 1

## 2022-01-08 MED ORDER — POTASSIUM CHLORIDE CRYS ER 20 MEQ PO TBCR
40.0000 meq | EXTENDED_RELEASE_TABLET | ORAL | Status: DC
  Administered 2022-01-08: 40 meq via ORAL
  Filled 2022-01-08: qty 2

## 2022-01-08 MED ORDER — ATORVASTATIN CALCIUM 80 MG PO TABS
80.0000 mg | ORAL_TABLET | Freq: Every day | ORAL | Status: DC
Start: 2022-01-08 — End: 2022-01-08

## 2022-01-08 MED ORDER — METOPROLOL TARTRATE 25 MG PO TABS
25.0000 mg | ORAL_TABLET | Freq: Two times a day (BID) | ORAL | Status: DC
  Administered 2022-01-08 – 2022-01-09 (×3): 25 mg via ORAL
  Filled 2022-01-08 (×3): qty 1

## 2022-01-08 MED ORDER — TICAGRELOR 90 MG PO TABS: 90.0000 mg | ORAL_TABLET | Freq: Two times a day (BID) | ORAL | Status: DC

## 2022-01-08 MED ORDER — SODIUM CHLORIDE 0.9 % IV SOLN
250.0000 mL | INTRAVENOUS | Status: DC | PRN
Start: 2022-01-08 — End: 2022-01-09

## 2022-01-08 MED ORDER — HEPARIN (PORCINE) IN NACL 1000-0.9 UT/500ML-% IV SOLN
INTRAVENOUS | Status: DC | PRN
  Administered 2022-01-08 (×2): 500 mL

## 2022-01-08 MED ORDER — ASPIRIN 81 MG PO CHEW: 324.0000 mg | CHEWABLE_TABLET | ORAL | Status: DC

## 2022-01-08 MED ORDER — HEPARIN SODIUM (PORCINE) 5000 UNIT/ML IJ SOLN
5000.0000 [IU] | Freq: Three times a day (TID) | INTRAMUSCULAR | Status: DC
  Administered 2022-01-08 – 2022-01-09 (×3): 5000 [IU] via SUBCUTANEOUS
  Filled 2022-01-08 (×4): qty 1

## 2022-01-08 MED ORDER — ASPIRIN 81 MG PO TBEC: 81.0000 mg | DELAYED_RELEASE_TABLET | Freq: Every day | ORAL | Status: DC

## 2022-01-08 MED ORDER — SODIUM CHLORIDE 0.9% FLUSH
3.0000 mL | Freq: Two times a day (BID) | INTRAVENOUS | Status: DC
  Administered 2022-01-08 – 2022-01-09 (×2): 3 mL via INTRAVENOUS

## 2022-01-08 MED ORDER — POTASSIUM CHLORIDE CRYS ER 10 MEQ PO TBCR
10.0000 meq | EXTENDED_RELEASE_TABLET | Freq: Every day | ORAL | Status: DC
Start: 2022-01-08 — End: 2022-01-09
  Administered 2022-01-08 – 2022-01-09 (×2): 10 meq via ORAL
  Filled 2022-01-08 (×3): qty 1

## 2022-01-08 MED ORDER — ASPIRIN 81 MG PO CHEW: 81.0000 mg | CHEWABLE_TABLET | Freq: Every day | ORAL | Status: DC

## 2022-01-08 MED ORDER — CYCLOBENZAPRINE HCL 10 MG PO TABS
10.0000 mg | ORAL_TABLET | Freq: Three times a day (TID) | ORAL | Status: DC | PRN
  Filled 2022-01-08: qty 1

## 2022-01-08 MED ORDER — HEPARIN SODIUM (PORCINE) 1000 UNIT/ML IJ SOLN
INTRAMUSCULAR | Status: AC
Start: 2022-01-08 — End: ?
  Filled 2022-01-08: qty 10

## 2022-01-08 MED ORDER — ASPIRIN 81 MG PO CHEW
CHEWABLE_TABLET | ORAL | Status: DC | PRN
  Administered 2022-01-08: 81 mg via ORAL

## 2022-01-08 MED ORDER — LABETALOL HCL 5 MG/ML IV SOLN: 10.0000 mg | INTRAVENOUS | Status: AC | PRN

## 2022-01-08 MED ORDER — NICOTINE 7 MG/24HR TD PT24
7.0000 mg | MEDICATED_PATCH | Freq: Every day | TRANSDERMAL | Status: DC
  Administered 2022-01-08 – 2022-01-09 (×2): 7 mg via TRANSDERMAL
  Filled 2022-01-08 (×2): qty 1

## 2022-01-08 MED ORDER — ONDANSETRON HCL 4 MG/2ML IJ SOLN
4.0000 mg | Freq: Once | INTRAMUSCULAR | Status: AC
  Administered 2022-01-08: 4 mg via INTRAVENOUS
  Filled 2022-01-08: qty 2

## 2022-01-08 MED ORDER — CHLORHEXIDINE GLUCONATE CLOTH 2 % EX PADS
6.0000 | MEDICATED_PAD | Freq: Every day | CUTANEOUS | Status: DC
  Administered 2022-01-09: 6 via TOPICAL

## 2022-01-08 MED ORDER — HEPARIN (PORCINE) IN NACL 1000-0.9 UT/500ML-% IV SOLN
INTRAVENOUS | Status: AC
  Filled 2022-01-08: qty 1000

## 2022-01-08 MED ORDER — NITROGLYCERIN 1 MG/10 ML FOR IR/CATH LAB
INTRA_ARTERIAL | Status: DC | PRN
  Administered 2022-01-08: 200 ug via INTRACORONARY

## 2022-01-08 MED ORDER — TICAGRELOR 90 MG PO TABS
90.0000 mg | ORAL_TABLET | Freq: Two times a day (BID) | ORAL | Status: DC
  Administered 2022-01-08 – 2022-01-09 (×2): 90 mg via ORAL
  Filled 2022-01-08 (×2): qty 1

## 2022-01-08 MED ORDER — BUSPIRONE HCL 10 MG PO TABS
10.0000 mg | ORAL_TABLET | Freq: Three times a day (TID) | ORAL | Status: DC
  Administered 2022-01-08 – 2022-01-09 (×5): 10 mg via ORAL
  Filled 2022-01-08 (×5): qty 1

## 2022-01-08 MED ORDER — ORAL CARE MOUTH RINSE: 15.0000 mL | OROMUCOSAL | Status: DC | PRN

## 2022-01-08 MED ORDER — AMLODIPINE BESYLATE 10 MG PO TABS
10.0000 mg | ORAL_TABLET | Freq: Every day | ORAL | Status: DC
  Administered 2022-01-08 – 2022-01-09 (×2): 10 mg via ORAL
  Filled 2022-01-08 (×2): qty 1

## 2022-01-08 MED ORDER — ATORVASTATIN CALCIUM 80 MG PO TABS: 80.0000 mg | ORAL_TABLET | Freq: Every day | ORAL | Status: DC

## 2022-01-08 MED ORDER — MAGNESIUM SULFATE 4 GM/100ML IV SOLN
4.0000 g | Freq: Once | INTRAVENOUS | Status: AC
  Administered 2022-01-08: 4 g via INTRAVENOUS
  Filled 2022-01-08: qty 100

## 2022-01-08 MED ORDER — LIDOCAINE HCL (PF) 1 % IJ SOLN
INTRAMUSCULAR | Status: AC
  Filled 2022-01-08: qty 30

## 2022-01-08 MED ORDER — NITROGLYCERIN 1 MG/10 ML FOR IR/CATH LAB
INTRA_ARTERIAL | Status: AC
  Filled 2022-01-08: qty 10

## 2022-01-08 MED ORDER — PERFLUTREN LIPID MICROSPHERE
1.0000 mL | INTRAVENOUS | Status: AC | PRN
  Administered 2022-01-08: 3 mL via INTRAVENOUS

## 2022-01-08 MED ORDER — SODIUM CHLORIDE 0.9 % IV SOLN: INTRAVENOUS | Status: AC

## 2022-01-08 MED ORDER — TICAGRELOR 90 MG PO TABS
ORAL_TABLET | ORAL | Status: AC
  Filled 2022-01-08: qty 2

## 2022-01-08 MED ORDER — FENTANYL CITRATE (PF) 100 MCG/2ML IJ SOLN
INTRAMUSCULAR | Status: DC | PRN
  Administered 2022-01-08: 25 ug via INTRAVENOUS

## 2022-01-08 MED ORDER — BUPROPION HCL ER (XL) 150 MG PO TB24
300.0000 mg | ORAL_TABLET | Freq: Every day | ORAL | Status: DC
  Administered 2022-01-08 – 2022-01-09 (×2): 300 mg via ORAL
  Filled 2022-01-08 (×2): qty 2

## 2022-01-08 MED ORDER — IOHEXOL 350 MG/ML SOLN
INTRAVENOUS | Status: DC | PRN
  Administered 2022-01-08: 100 mL

## 2022-01-08 MED ORDER — LISINOPRIL 20 MG PO TABS
20.0000 mg | ORAL_TABLET | Freq: Two times a day (BID) | ORAL | Status: DC
  Administered 2022-01-08 – 2022-01-09 (×3): 20 mg via ORAL
  Filled 2022-01-08 (×3): qty 1

## 2022-01-08 MED ORDER — HYDRALAZINE HCL 20 MG/ML IJ SOLN
10.0000 mg | INTRAMUSCULAR | Status: AC | PRN
  Administered 2022-01-08 (×3): 10 mg via INTRAVENOUS
  Filled 2022-01-08: qty 1

## 2022-01-08 MED ORDER — ALBUTEROL SULFATE (2.5 MG/3ML) 0.083% IN NEBU
2.5000 mg | INHALATION_SOLUTION | Freq: Four times a day (QID) | RESPIRATORY_TRACT | Status: DC | PRN
Start: 2022-01-08 — End: 2022-01-09

## 2022-01-08 MED ORDER — ASPIRIN 81 MG PO CHEW
81.0000 mg | CHEWABLE_TABLET | Freq: Every day | ORAL | Status: DC
  Administered 2022-01-09: 81 mg via ORAL
  Filled 2022-01-08: qty 1

## 2022-01-08 MED ORDER — ACETAMINOPHEN 325 MG PO TABS: 650.0000 mg | ORAL_TABLET | ORAL | Status: DC | PRN

## 2022-01-08 MED ORDER — FENTANYL CITRATE (PF) 100 MCG/2ML IJ SOLN
INTRAMUSCULAR | Status: AC
  Filled 2022-01-08: qty 2

## 2022-01-08 MED ORDER — HEPARIN SODIUM (PORCINE) 5000 UNIT/ML IJ SOLN
4000.0000 [IU] | Freq: Once | INTRAMUSCULAR | Status: AC
Start: 2022-01-08 — End: 2022-01-08
  Administered 2022-01-08: 4000 [IU] via INTRAVENOUS
  Filled 2022-01-08: qty 1

## 2022-01-08 SURGICAL SUPPLY — 17 items
BALLN SAPPHIRE 2.0X15 (BALLOONS) ×1
BALLOON SAPPHIRE 2.0X15 (BALLOONS) IMPLANT
CATH INFINITI 5FR MULTPACK ANG (CATHETERS) IMPLANT
CATH LAUNCHER 6FR EBU3.5 (CATHETERS) IMPLANT
GLIDESHEATH SLEND SS 6F .021 (SHEATH) IMPLANT
GUIDEWIRE INQWIRE 1.5J.035X260 (WIRE) IMPLANT
INQWIRE 1.5J .035X260CM (WIRE)
KIT ENCORE 26 ADVANTAGE (KITS) IMPLANT
KIT HEART LEFT (KITS) ×1 IMPLANT
PACK CARDIAC CATHETERIZATION (CUSTOM PROCEDURE TRAY) ×1 IMPLANT
SHEATH PINNACLE 6F 10CM (SHEATH) IMPLANT
SHEATH PROBE COVER 6X72 (BAG) IMPLANT
TRANSDUCER W/STOPCOCK (MISCELLANEOUS) ×1 IMPLANT
TUBING CIL FLEX 10 FLL-RA (TUBING) ×1 IMPLANT
VALVE GUARDIAN II ~~LOC~~ HEMO (MISCELLANEOUS) IMPLANT
WIRE ASAHI PROWATER 180CM (WIRE) IMPLANT
WIRE EMERALD 3MM-J .035X150CM (WIRE) IMPLANT

## 2022-01-08 NOTE — H&P (Addendum)
Cardiology Admission History and Physical   Patient ID: Jean Nelson MRN: [id number]; DOB: [birth year]   Admission date: 01/08/2022  PCP:  Medicine, [facility]   [facility] Providers Cardiologist:  None        Chief Complaint: Chest pain with STEMI  Patient Profile:   Jean Nelson is a 56 y.o. female with ongoing chest pain found to have ST elevations who is being seen 01/08/2022 for the evaluation of STEMI.  History of Present Illness:   Jean Nelson is a very pleasant Caucasian female with chest pain starting from 1 AM 01/08/2022.  Past medical history significant for hypertension, hyperlipidemia, active smoking, coronary disease, status post prior acute MI 2 years ago in [place], patient description sounds like patient had a CTO of the RCA.  It was not intervened upon at that time given left right collaterals.  Patient reported is independent with ADLs after MI.  She was on DAPT with Plavix ever since.  She denies any bleeding issues.  Patient initially presented to outside hospital found to have ST elevations and she was emergently transported to [facility].  Patient seen and evaluated in the emergency room.  She is not in acute distress, still complains of some ongoing chest pain.  While in route EKG with minimal ST elevations in V5 V6.  Persistent Q waves in inferior leads.  Patient received nitroglycerin sublingual, with evidence of heparin and aspirin load. Patient was emergently taken to the Cath Lab.  LCP were right SFA approach.  Evidence of obstructive disease in the distal LAD with left-to-right collaterals.  Evidence of obstructive disease in the mid to distal circumflex.  CTO of the proximal RCA.  Rapid reconstitution of flow from collaterals.  Elevated EDP of 17. Decision was made to proceed with LAD intervention.  Now status post PTCA.  Patient is chest pain-free at end of procedure  Past Medical History:  Diagnosis Date   Anxiety    Chronic  back pain    Essential hypertension    Heart attack (HCC)    Plantar fasciitis    Sciatica     Past Surgical History:  Procedure Laterality Date   APPENDECTOMY     CARDIAC CATHETERIZATION     TUBAL LIGATION       Medications Prior to Admission: Prior to Admission medications   Medication Sig Start Date End Date Taking? Authorizing Provider  albuterol (PROVENTIL HFA;VENTOLIN HFA) 108 (90 BASE) MCG/ACT inhaler Inhale 1-2 puffs into the lungs every 6 (six) hours as needed for wheezing (Use the inhaler for the next 4 days 4 times a day then begin using only if needed for wheezing). 03/16/11 11/18/16  Annamarie Dawley, MD  ALPRAZolam Prudy Feeler) 1 MG tablet TAKE 1 TABLET(1 MG) BY MOUTH THREE TIMES DAILY 01/06/22   Myrlene Broker, MD  amLODipine (NORVASC) 10 MG tablet Take 1 tablet (10 mg total) by mouth daily. 06/03/16   Johnson, Clanford L, MD  amphetamine-dextroamphetamine (ADDERALL) 20 MG tablet Take 1 tablet (20 mg total) by mouth 2 (two) times daily. 01/06/22 01/06/23  Myrlene Broker, MD  amphetamine-dextroamphetamine (ADDERALL) 20 MG tablet Take 1 tablet (20 mg total) by mouth 2 (two) times daily. 01/06/22   Myrlene Broker, MD  amphetamine-dextroamphetamine (ADDERALL) 20 MG tablet Take 1 tablet (20 mg total) by mouth 2 (two) times daily. 01/06/22   Myrlene Broker, MD  atorvastatin (LIPITOR) 80 MG tablet Take 80 mg by mouth daily. 12/30/21  [provider]  buPROPion (WELLBUTRIN XL) 300 MG 24 hr tablet Take 1 tablet (300 mg total) by mouth every morning. 01/06/22 01/06/23  Myrlene Broker, MD  busPIRone (BUSPAR) 10 MG tablet Take 1 tablet (10 mg total) by mouth 3 (three) times daily. 01/06/22   Myrlene Broker, MD  cyclobenzaprine (FLEXERIL) 10 MG tablet Take 1 tablet by mouth 3 (three) times daily as needed for muscle spasms.  05/26/16   [provider]  lisinopril (ZESTRIL) 20 MG tablet Take by mouth. 11/15/19   [provider]  metoprolol tartrate (LOPRESSOR) 25 MG tablet Take 1  tablet by mouth 2 (two) times daily. 05/24/20   [provider]  potassium chloride (K-DUR) 10 MEQ tablet Take 10 mEq by mouth daily.    [provider]  traZODone (DESYREL) 50 MG tablet TAKE 1 TABLET BY MOUTH EVERYDAY AT BEDTIME 01/06/22   Myrlene Broker, MD     Allergies:    Allergies  Allergen Reactions   Codeine Hives    Social History:   Social History   Socioeconomic History   Marital status: Married    Spouse name: Not on file   Number of children: Not on file   Years of education: Not on file   Highest education level: Not on file  Occupational History   Not on file  Tobacco Use   Smoking status: Every Day    Packs/day: 0.50    Types: Cigarettes   Smokeless tobacco: Never  Vaping Use   Vaping Use: Never used  Substance and Sexual Activity   Alcohol use: Yes    Comment: occasionally    Drug use: No   Sexual activity: Yes    Birth control/protection: Surgical  Other Topics Concern   Not on file  Social History Narrative   Not on file   Social Determinants of Health   Financial Resource Strain: Not on file  Food Insecurity: Not on file  Transportation Needs: Not on file  Physical Activity: Not on file  Stress: Not on file  Social Connections: Not on file  Intimate Partner Violence: Not on file    Family History:   The patient's family history includes ADD / ADHD in her mother; Alcohol abuse in her maternal grandfather and maternal uncle; Depression in her maternal grandfather; Hypertension in her mother.    ROS:  Please see the history of present illness.  All other ROS reviewed and negative.     Physical Exam/Data:   Vitals:   01/08/22 0336 01/08/22 0338 01/08/22 0400 01/08/22 0408  BP:  (!) 198/94 (!) 162/92 (!) 164/94  Pulse:  75 87 85  Resp:  18 18 18   Temp:  98.5 F (36.9 C)    TempSrc:  Oral    SpO2:  99% 97% 96%  Weight: 79.4 kg     Height: 5\' 3"  (1.6 m)      No intake or output data in the 24 hours ending 01/08/22  0532    01/08/2022    3:36 AM 06/24/2020    7:01 PM 03/24/2018    9:58 AM  Last 3 Weights  Weight (lbs) 175 lb 170 lb   Weight (kg) 79.379 kg 77.111 kg      Information is confidential and restricted. Go to Review Flowsheets to unlock data.     Body mass index is 31 kg/m.  General:  Well nourished, well developed, in no acute distress HEENT: normal Neck: no JVD Vascular: No carotid bruits;  Distal pulses 2+ bilaterally   Cardiac:  normal S1, S2; RRR; no murmur  Lungs:  clear to auscultation bilaterally, no wheezing, rhonchi or rales  Abd: soft, nontender, no hepatomegaly  Ext: no edema Musculoskeletal:  No deformities, BUE and BLE strength normal and equal Skin: warm and dry  Neuro:  CNs 2-12 intact, no focal abnormalities noted Psych:  Normal affect    EKG:  The ECG that was done  was personally reviewed and demonstrates normal sinus rhythm with evidence of old Q wave myocardial infarction inferior leads and minimal ST elevations in V5 V6  Relevant CV Studies: Left heart cath in 2019 consider any  Laboratory Data:  High Sensitivity Troponin:   Recent Labs  Lab 01/08/22 0340  TROPONINIHS 8      Chemistry Recent Labs  Lab 01/08/22 0347  NA 141  K 2.7*  CL 109  CO2 24  GLUCOSE 121*  BUN 9  CREATININE 0.71  CALCIUM 8.9  GFRNONAA >60  ANIONGAP 8    Recent Labs  Lab 01/08/22 0347  PROT 7.6  ALBUMIN 3.9  AST 19  ALT 18  ALKPHOS 105  BILITOT 0.7   Lipids  Recent Labs  Lab 01/08/22 0347  CHOL 121  TRIG 260*  HDL 35*  LDLCALC 34  CHOLHDL 3.5   Hematology Recent Labs  Lab 01/08/22 0340  WBC 8.9  RBC 4.58  HGB 14.0  HCT 41.9  MCV 91.5  MCH 30.6  MCHC 33.4  RDW 13.9  PLT 220   Thyroid No results for input(s): "TSH", "FREET4" in the last 168 hours. BNPNo results for input(s): "BNP", "PROBNP" in the last 168 hours.  DDimer No results for input(s): "DDIMER" in the last 168 hours.   Radiology/Studies:  DG Chest Portable 1 View  Result  Date: 01/08/2022 CLINICAL DATA:  Sudden onset right chest pain. EXAM: PORTABLE CHEST 1 VIEW COMPARISON:  PA and lateral 03/16/2011 FINDINGS: The heart size and mediastinal contours are within normal limits. Both lungs are clear. The visualized skeletal structures are unremarkable. IMPRESSION: No active disease.  Stable chest. Electronically Signed   By: Almira Bar M.D.   On: 01/08/2022 04:02     Assessment and Plan:   Probable STEMI in the setting of known CTO.  Most likely in the setting of distal LAD disease leading to interruption of flow to collaterals.  Probably in the setting of ongoing smoking and modify his risk factors.  Patient was loaded with Brilinta while in the Cath Lab. Please continue with ticagrelor based DAPT indefinitely.  Follow-up lipid profile.  Follow-up TTE.Follow-up A1c.  We will start patient on low-dose beta-blockers as tolerated.  Referral to cardiac rehab after discharge. Hypertension.  Concern for primary hyper Aldo.Follow-up aldosterone renin ratio  Active smoking.  Nicotine patch n.p.o. nicotine persistent hypokalemia.  Aggressive repletion. Goal potassium of 4, magnesium of 2     Risk Assessment/Risk Scores:    TIMI Risk Score for ST  Elevation MI:   The patient's TIMI risk score is 4, which indicates a 7.3% risk of all cause mortality at 30 days.        Severity of Illness: The appropriate patient status for this patient is OBSERVATION. Observation status is judged to be reasonable and necessary in order to provide the required intensity of service to ensure the patient's safety. The patient's presenting symptoms, physical exam findings, and initial radiographic and laboratory data in the context of their medical condition is felt to place them at decreased  risk for further clinical deterioration. Furthermore, it is anticipated that the patient will be medically stable for discharge from the hospital within 2 midnights of admission.    For questions or  updates, please contact [facility] Please consult [email or web address] for contact info under     Signed, Lenon Oms, MD  01/08/2022 5:32 AM   I have examined the patient and reviewed assessment and plan and discussed with patient.  Agree with above as stated.    I personally reviewed the ECG and made the decision for the patient to come to the Cath Lab. Dist LAD-1 lesion is 100% stenosed.  Balloon angioplasty was performed using a BALLN SAPPHIRE 2.0X15.   Post intervention, there is a 0% residual stenosis.   Dist LAD-2 lesion is 100% stenosed.   Prox RCA lesion is 100% stenosed.  Brisk right to right and left to right collaterals.   There is mild to moderate left ventricular systolic dysfunction.  Apical hypokinesis related to the distal LAD lesion.   LV end diastolic pressure is normal.   The left ventricular ejection fraction is 45-50% by visual estimate.   There is no aortic valve stenosis.   Significant two-vessel coronary artery disease including chronic total occlusion of the proximal RCA.     Culprit vessel was the occluded distal LAD which was successfully opened with balloon angioplasty.  The vessel was too small for stenting.  After flow was improved, there did appear to be embolization further down on the vessel.  This was too distal for even a balloon angioplasty.  Patient was therapeutic on heparin.  We are initiating Brilinta antiplatelet therapy for stronger effect.  Will initiate high-dose statin.   I suspect her ECG changes were due to the apical LAD occlusion compromising collaterals to the RCA.   She needs aggressive medical therapy and lifestyle modification including smoking cessation.  Lance Muss

## 2022-01-08 NOTE — Progress Notes (Addendum)
Rounding Note    Patient Name: Jean Nelson Date of Encounter: 01/08/2022  [facility] Cardiologist: None   Subjective   Patient reports mild aching chest pain but much improved compared to before PTCA.  She is interested in smoking cessation.   Inpatient Medications    Scheduled Meds:  amLODipine  10 mg Oral Daily   [START ON 01/09/2022] aspirin  81 mg Oral Daily   buPROPion  300 mg Oral Daily   busPIRone  10 mg Oral TID   heparin  5,000 Units Subcutaneous Q8H   lisinopril  20 mg Oral BID   metoprolol tartrate  25 mg Oral BID   nicotine  7 mg Transdermal Daily   potassium chloride  10 mEq Oral Daily   rosuvastatin  40 mg Oral Daily   sodium chloride flush  3 mL Intravenous Q12H   ticagrelor  90 mg Oral BID   Continuous Infusions:  sodium chloride 20 mL/hr at 01/08/22 0351   sodium chloride 75 mL/hr at 01/08/22 0622   sodium chloride     PRN Meds: sodium chloride, acetaminophen, albuterol, ALPRAZolam, cyclobenzaprine, hydrALAZINE, labetalol, nitroGLYCERIN, ondansetron (ZOFRAN) IV, sodium chloride flush, traZODone   Vital Signs    Vitals:   01/08/22 0745 01/08/22 0800 01/08/22 0815 01/08/22 0830  BP:      Pulse: 65 74 61 (!) 57  Resp: 19 15 15 15   Temp:      TempSrc:      SpO2: 98% 98% 97% 97%  Weight:      Height:       No intake or output data in the 24 hours ending 01/08/22 0902    01/08/2022    3:36 AM 06/24/2020    7:01 PM 03/24/2018    9:58 AM  Last 3 Weights  Weight (lbs) 175 lb 170 lb   Weight (kg) 79.379 kg 77.111 kg      Information is confidential and restricted. Go to Review Flowsheets to unlock data.      Telemetry    NSR - Personally Reviewed  ECG    Sinus bradycardia, normal axis, Q waves noted in inferior leads - Personally Reviewed  Physical Exam   GEN: No acute distress.   Cardiac: RRR, no murmurs, rubs, or gallops.  Respiratory: Normal work of breathing. Mild wheezing bilaterally.  MS: No edema; No  deformity. Neuro:  alert and oriented   Psych: Normal affect   Labs    High Sensitivity Troponin:   Recent Labs  Lab 01/08/22 0340  TROPONINIHS 8     Chemistry Recent Labs  Lab 01/08/22 0347  NA 141  K 2.7*  CL 109  CO2 24  GLUCOSE 121*  BUN 9  CREATININE 0.71  CALCIUM 8.9  PROT 7.6  ALBUMIN 3.9  AST 19  ALT 18  ALKPHOS 105  BILITOT 0.7  GFRNONAA >60  ANIONGAP 8    Lipids  Recent Labs  Lab 01/08/22 0347  CHOL 121  TRIG 260*  HDL 35*  LDLCALC 34  CHOLHDL 3.5    Hematology Recent Labs  Lab 01/08/22 0340  WBC 8.9  RBC 4.58  HGB 14.0  HCT 41.9  MCV 91.5  MCH 30.6  MCHC 33.4  RDW 13.9  PLT 220   Thyroid No results for input(s): "TSH", "FREET4" in the last 168 hours.  BNPNo results for input(s): "BNP", "PROBNP" in the last 168 hours.  DDimer No results for input(s): "DDIMER" in the last 168 hours.   Radiology  CARDIAC CATHETERIZATION  Addendum Date: 01/08/2022     Dist LAD-1 lesion is 100% stenosed.  Balloon angioplasty was performed using a BALLN SAPPHIRE 2.0X15.   Post intervention, there is a 0% residual stenosis.   Dist LAD-2 lesion is 100% stenosed.   Prox RCA lesion is 100% stenosed.  Brisk right to right and left to right collaterals.   There is mild to moderate left ventricular systolic dysfunction.  Apical hypokinesis related to the distal LAD lesion.   LV end diastolic pressure is normal.   The left ventricular ejection fraction is 45-50% by visual estimate.   There is no aortic valve stenosis. Significant two-vessel coronary artery disease including chronic total occlusion of the proximal RCA.  Culprit vessel was the occluded distal LAD which was successfully opened with balloon angioplasty.  The vessel was too small for stenting.  After flow was improved, there did appear to be embolization further down on the vessel.  This was too distal for even a balloon angioplasty.  Patient was therapeutic on heparin.  We are initiating Brilinta  antiplatelet therapy for stronger effect.  Will initiate high-dose statin. I suspect her ECG changes were due to the apical LAD occlusion compromising collaterals to the RCA. She needs aggressive medical therapy and lifestyle modification including smoking cessation.  Result Date: 01/08/2022   Dist LAD-1 lesion is 100% stenosed.  Balloon angioplasty was performed using a BALLN SAPPHIRE 2.0X15.   Post intervention, there is a 0% residual stenosis.   Dist LAD-2 lesion is 100% stenosed.   Prox RCA lesion is 100% stenosed.  Brisk right to right and left to right collaterals.   There is mild to moderate left ventricular systolic dysfunction.  Apical hypokinesis related to the distal LAD lesion.   LV end diastolic pressure is normal.   The left ventricular ejection fraction is 45-50% by visual estimate.   There is no aortic valve stenosis. Significant two-vessel coronary artery disease including chronic total occlusion of the proximal RCA.  Culprit vessel was the occluded distal LAD which was successfully opened with balloon angioplasty.  The vessel was too small for stenting.  After flow was improved, there did appear to be embolization further down on the vessel.  This was too distal for even a balloon angioplasty.  Patient was therapeutic on heparin.  We are initiating Brilinta antiplatelet therapy for stronger effect.  Will initiate high-dose statin. I suspect her ECG changes were due to the apical LAD occlusion compromising collaterals to the RCA. She needs aggressive medical therapy and lifestyle modification including smoking cessation.   DG Chest Portable 1 View  Result Date: 01/08/2022 CLINICAL DATA:  Sudden onset right chest pain. EXAM: PORTABLE CHEST 1 VIEW COMPARISON:  PA and lateral 03/16/2011 FINDINGS: The heart size and mediastinal contours are within normal limits. Both lungs are clear. The visualized skeletal structures are unremarkable. IMPRESSION: No active disease.  Stable chest. Electronically  Signed   By: Almira Bar M.D.   On: 01/08/2022 04:02    Cardiac Studies   Cardiac Cath      Dist LAD-1 lesion is 100% stenosed.  Balloon angioplasty was performed using a BALLN SAPPHIRE 2.0X15.   Post intervention, there is a 0% residual stenosis.   Dist LAD-2 lesion is 100% stenosed.   Prox RCA lesion is 100% stenosed.  Brisk right to right and left to right collaterals.   There is mild to moderate left ventricular systolic dysfunction.  Apical hypokinesis related to the distal LAD lesion.  LV end diastolic pressure is normal.   The left ventricular ejection fraction is 45-50% by visual estimate.   There is no aortic valve stenosis.   Significant two-vessel coronary artery disease including chronic total occlusion of the proximal RCA.     Culprit vessel was the occluded distal LAD which was successfully opened with balloon angioplasty.  The vessel was too small for stenting.  After flow was improved, there did appear to be embolization further down on the vessel.  This was too distal for even a balloon angioplasty.  Patient was therapeutic on heparin.  We are initiating Brilinta antiplatelet therapy for stronger effect.  Will initiate high-dose statin.   I suspect her ECG changes were due to the apical LAD occlusion compromising collaterals to the RCA.  Patient Profile     56 y.o. female with hx of chronic back pain, HTN, HLD and CAD s/p prior MI 2 years ago and active smoking presented with acute chest pain found to have STEMI s/p PTCA.   Assessment & Plan    STEMI S/p balloon angioplasty of occluded distal LAD. Patient is interested in smoking cessation. Lipid panel: LDL 34, TCG 260, HDL 35. A1c is 5.6. Pending echo. Plan for cardiac rehab at discharge. Will continue ticagrelor based DAPT.  -brilinta 90 mg BID -ASA 81 mg daily -lopressor 25 mg BID -rosuvastatin 40 mg daily -lisinopril 20 mg BID -pending echo today  2. Hypertension BP continues to be elevated. Concern for  primary hyperaldosteronism. Pending aldosterone renin ratio.  -on lisinopril, amlodipine and lopressor  3. Tobacco Use Discussed and encouraged smoking cessation. Patient is interested in stopping. Nicotine patch n.p.o. nicotine persistent hypokalemia.  Aggressive repletion. Goal potassium of 4, magnesium of 2. -nicotine patch 7 mg   For questions or updates, please contact [facility] Please consult [email or web address] for contact info under        Signed, Rana Snare, DO  01/08/2022, 9:02 AM     I have examined the patient and reviewed assessment and plan and discussed with patient.  Agree with above as stated.    LDL low.  May be falsely low in the setting of MI.  A1C 5.6.  Nicotine patch for smoking cessation.  May need to switch to Effient for insurance reasons after 1 month.   For HTN, may need amlodipine added back.  Consider SGLT2 inhibitor.   Hopeful to transfer to tele tomorrow if no issues today.   Lance Muss

## 2022-01-08 NOTE — Telephone Encounter (Signed)
Pharmacy Patient Advocate Encounter  Insurance verification completed.    The patient is insured through [employer]   The patient is currently admitted and ran test claims for the following: PRASUGREL 10MG  AND BRILINTA.  Copays and coinsurance results were relayed to Inpatient clinical team.

## 2022-01-08 NOTE — ED Provider Notes (Signed)
[facility] EMERGENCY DEPARTMENT Provider Note   CSN: [id number] Arrival date & time: 01/08/22  5597     History  Chief Complaint  Patient presents with   Chest Pain    Jean Nelson is a 56 y.o. female.  Patient presents to the emergency department for evaluation of chest pain.  Patient reports that pain began approximately 1 and half hours ago.  Pain is fairly severe, causing mild shortness of breath.  Patient reports that the pain is primarily in the right side of her chest.  She reports that she had a heart attack 2 years ago in [place].  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  They were unable to place a stent.  She takes aspirin and Plavix.  Patient reports that the pain she is currently experiencing feels very similar to when she had a heart attack.       Home Medications Prior to Admission medications   Medication Sig Start Date End Date Taking? Authorizing Provider  albuterol (PROVENTIL HFA;VENTOLIN HFA) 108 (90 BASE) MCG/ACT inhaler Inhale 1-2 puffs into the lungs every 6 (six) hours as needed for wheezing (Use the inhaler for the next 4 days 4 times a day then begin using only if needed for wheezing). 03/16/11 11/18/16  Annamarie Dawley, MD  ALPRAZolam Prudy Feeler) 1 MG tablet TAKE 1 TABLET(1 MG) BY MOUTH THREE TIMES DAILY 01/06/22   Myrlene Broker, MD  amLODipine (NORVASC) 10 MG tablet Take 1 tablet (10 mg total) by mouth daily. 06/03/16   Johnson, Clanford L, MD  amphetamine-dextroamphetamine (ADDERALL) 20 MG tablet Take 1 tablet (20 mg total) by mouth 2 (two) times daily. 01/06/22 01/06/23  Myrlene Broker, MD  amphetamine-dextroamphetamine (ADDERALL) 20 MG tablet Take 1 tablet (20 mg total) by mouth 2 (two) times daily. 01/06/22   Myrlene Broker, MD  amphetamine-dextroamphetamine (ADDERALL) 20 MG tablet Take 1 tablet (20 mg total) by mouth 2 (two) times daily. 01/06/22   Myrlene Broker, MD  atorvastatin  (LIPITOR) 80 MG tablet Take 80 mg by mouth daily. 12/30/21   [provider]  buPROPion (WELLBUTRIN XL) 300 MG 24 hr tablet Take 1 tablet (300 mg total) by mouth every morning. 01/06/22 01/06/23  Myrlene Broker, MD  busPIRone (BUSPAR) 10 MG tablet Take 1 tablet (10 mg total) by mouth 3 (three) times daily. 01/06/22   Myrlene Broker, MD  cyclobenzaprine (FLEXERIL) 10 MG tablet Take 1 tablet by mouth 3 (three) times daily as needed for muscle spasms.  05/26/16   [provider]  lisinopril (ZESTRIL) 20 MG tablet Take by mouth. 11/15/19   [provider]  metoprolol tartrate (LOPRESSOR) 25 MG tablet Take 1 tablet by mouth 2 (two) times daily. 05/24/20   [provider]  potassium chloride (K-DUR) 10 MEQ tablet Take 10 mEq by mouth daily.    [provider]  traZODone (DESYREL) 50 MG tablet TAKE 1 TABLET BY MOUTH EVERYDAY AT BEDTIME 01/06/22   Myrlene Broker, MD      Allergies    Codeine    Review of Systems   Review of Systems  Physical Exam Updated Vital Signs BP (!) 162/92   Pulse 87   Temp 98.5 F (36.9 C) (Oral)   Resp 18   Ht 5\' 3"  (1.6 m)   Wt 79.4 kg   LMP 05/25/2016   SpO2 97%   BMI 31.00 kg/m  Physical Exam Vitals and nursing note reviewed.  Constitutional:      General: She is in acute distress.     Appearance: She is well-developed.  HENT:     Head: Normocephalic and atraumatic.     Mouth/Throat:     Mouth: Mucous membranes are moist.  Eyes:     General: Vision grossly intact. Gaze aligned appropriately.     Extraocular Movements: Extraocular movements intact.     Conjunctiva/sclera: Conjunctivae normal.  Cardiovascular:     Rate and Rhythm: Normal rate and regular rhythm.     Pulses: Normal pulses.     Heart sounds: Normal heart sounds, S1 normal and S2 normal. No murmur heard.    No friction rub. No gallop.  Pulmonary:     Effort: Pulmonary effort is normal. No respiratory distress.     Breath sounds: Normal breath sounds.   Abdominal:     General: Bowel sounds are normal.     Palpations: Abdomen is soft.     Tenderness: There is no abdominal tenderness. There is no guarding or rebound.     Hernia: No hernia is present.  Musculoskeletal:        General: No swelling.     Cervical back: Full passive range of motion without pain, normal range of motion and neck supple. No spinous process tenderness or muscular tenderness. Normal range of motion.     Right lower leg: No edema.     Left lower leg: No edema.  Skin:    General: Skin is warm and dry.     Capillary Refill: Capillary refill takes less than 2 seconds.     Findings: No ecchymosis, erythema, rash or wound.  Neurological:     General: No focal deficit present.     Mental Status: She is alert and oriented to person, place, and time.     GCS: GCS eye subscore is 4. GCS verbal subscore is 5. GCS motor subscore is 6.     Cranial Nerves: Cranial nerves 2-12 are intact.     Sensory: Sensation is intact.     Motor: Motor function is intact.     Coordination: Coordination is intact.  Psychiatric:        Attention and Perception: Attention normal.        Mood and Affect: Mood normal.        Speech: Speech normal.        Behavior: Behavior normal.     ED Results / Procedures / Treatments   Labs (all labs ordered are listed, but only abnormal results are displayed) Labs Reviewed  RESP PANEL BY RT-PCR (FLU A&B, COVID) ARPGX2  CBC  PROTIME-INR  APTT  HEMOGLOBIN A1C  COMPREHENSIVE METABOLIC PANEL  LIPID PANEL  POC URINE PREG, ED  TROPONIN I (HIGH SENSITIVITY)    EKG EKG Interpretation  Date/Time:  Thursday January 08 2022 03:40:51 EDT Ventricular Rate:  67 PR Interval:  156 QRS Duration: 120 QT Interval:  413 QTC Calculation: 436 R Axis:   75 Text Interpretation: Sinus rhythm Nonspecific intraventricular conduction delay Inferior infarct, acute (RCA) Borderline ST elevation, anterior leads Probable RV involvement, suggest recording right  precordial leads Baseline wander in lead(s) II III aVL aVF >>> Acute MI <<< Confirmed by Gilda Crease [id number]) on 01/08/2022 4:03:10 AM  Radiology DG Chest Portable 1 View  Result Date: 01/08/2022 CLINICAL DATA:  Sudden onset right chest pain. EXAM: PORTABLE CHEST 1 VIEW COMPARISON:  PA and lateral 03/16/2011 FINDINGS: The heart  size and mediastinal contours are within normal limits. Both lungs are clear. The visualized skeletal structures are unremarkable. IMPRESSION: No active disease.  Stable chest. Electronically Signed   By: Telford Nab M.D.   On: 01/08/2022 04:02    Procedures Procedures    Medications Ordered in ED Medications  0.9 %  sodium chloride infusion ( Intravenous New Bag/Given 01/08/22 0351)  nitroGLYCERIN (NITROSTAT) SL tablet 0.4 mg (0.4 mg Sublingual Given 01/08/22 0356)  aspirin chewable tablet 324 mg (324 mg Oral Given 01/08/22 0350)  heparin injection 4,000 Units (4,000 Units Intravenous Given 01/08/22 0350)  morphine (PF) 4 MG/ML injection 4 mg (4 mg Intravenous Given 01/08/22 0359)  ondansetron (ZOFRAN) injection 4 mg (4 mg Intravenous Given 01/08/22 0359)    ED Course/ Medical Decision Making/ A&P                           Medical Decision Making Amount and/or Complexity of Data Reviewed Labs: ordered. Radiology: ordered.  Risk OTC drugs. Prescription drug management.   Presents to the emergency department with 1.5 hours worth of right-sided chest pain similar to prior heart attack.  EKG with ST elevations in II, III, aVF, V5 and V6 with depressions in aVL.  This is concerning for inferior lateral ST elevation MI.  I do not have any recent EKGs for comparison.  Based on EKG findings and patient's description of pain as being similar to her heart attack, code STEMI activated.  Presentation, EKG discussed with Dr. Irish Lack, on-call for interventional cardiology.  He agrees that the patient should be transferred to [facility] for cardiac  catheterization.  Patient very hypertensive at arrival.  We did discuss small amounts of nitroglycerin with careful monitoring of blood pressure, as patient's EKG does indicate inferior territory.  Patient had significant improvement after single nitroglycerin.  Blood pressure improved but did not significantly drop.  Patient also administered morphine for pain control.  Will be transported emergently to [facility].  CRITICAL CARE Performed by: Orpah Greek   Total critical care time: 33 minutes  Critical care time was exclusive of separately billable procedures and treating other patients.  Critical care was necessary to treat or prevent imminent or life-threatening deterioration.  Critical care was time spent personally by me on the following activities: development of treatment plan with patient and/or surrogate as well as nursing, discussions with consultants, evaluation of patient's response to treatment, examination of patient, obtaining history from patient or surrogate, ordering and performing treatments and interventions, ordering and review of laboratory studies, ordering and review of radiographic studies, pulse oximetry and re-evaluation of patient's condition.        Final Clinical Impression(s) / ED Diagnoses Final diagnoses:  ST elevation myocardial infarction (STEMI), unspecified artery [facility])    Rx / DC Orders ED Discharge Orders     None         Taegan Haider, Gwenyth Allegra, MD 01/08/22 0410

## 2022-01-08 NOTE — ED Notes (Signed)
Critical value Potassium 2.7 reported to [facility]PD [place]. No new orders. Pt enroute to [facility]

## 2022-01-08 NOTE — ED Triage Notes (Signed)
Pt c/o sudden right sided chest pain for the past 1.5 hours. Pt states she was dx with 100% blockage on the right side of her heart 2 years ago, pt placed on blood thinner by cardiology. Pt also endorses some shortness of breath that started about mid day yesterday.

## 2022-01-08 NOTE — TOC Benefit Eligibility Note (Signed)
Patient Scientific laboratory technician completed.     The patient is currently admitted and upon discharge could be taking .PRASUGREL 10MG    The current 30 day co-pay is, $70.53 DUE TO 8,744.05 DEDUCTIBLE REMAIN.   The patient is currently admitted and upon discharge could be taking .BRILINTA 90MG   NO FORMULARY   The patient is insured through .

## 2022-01-08 NOTE — ED Notes (Signed)
Pt arrived via [facility], cardiology fellow at bedside, pt c/o dull pain to chest, EMS reports an additional 2NTG given en route.

## 2022-01-08 NOTE — ED Provider Notes (Signed)
Pt arrived from [facility] as STEMI, she was briefly roomed in the ED while waiting for cath lab team to arrive. Pt is HDS, on ambient air, cardiologist at the bedside. She is having some ongoing pain but has improved. She will be sent to the cath lab shortly with cardiology. Care per cardiology.    Sloan Leiter, DO 01/08/22 [phone]

## 2022-01-09 ENCOUNTER — Encounter: Payer: Self-pay | Admitting: *Deleted

## 2022-01-09 ENCOUNTER — Telehealth (HOSPITAL_COMMUNITY): Payer: Self-pay | Admitting: Pharmacy Technician

## 2022-01-09 ENCOUNTER — Other Ambulatory Visit (HOSPITAL_COMMUNITY): Payer: Self-pay

## 2022-01-09 ENCOUNTER — Telehealth (HOSPITAL_COMMUNITY): Payer: Self-pay

## 2022-01-09 DIAGNOSIS — E782 Mixed hyperlipidemia: Secondary | ICD-10-CM | POA: Diagnosis not present

## 2022-01-09 DIAGNOSIS — E785 Hyperlipidemia, unspecified: Secondary | ICD-10-CM

## 2022-01-09 DIAGNOSIS — Z006 Encounter for examination for normal comparison and control in clinical research program: Secondary | ICD-10-CM

## 2022-01-09 DIAGNOSIS — I10 Essential (primary) hypertension: Secondary | ICD-10-CM

## 2022-01-09 DIAGNOSIS — I2109 ST elevation (STEMI) myocardial infarction involving other coronary artery of anterior wall: Secondary | ICD-10-CM | POA: Diagnosis not present

## 2022-01-09 DIAGNOSIS — Z720 Tobacco use: Secondary | ICD-10-CM | POA: Diagnosis not present

## 2022-01-09 LAB — CBC
HCT: 43.3 % (ref 36.0–46.0)
Hemoglobin: 14.6 g/dL (ref 12.0–15.0)
MCH: 30.6 pg (ref 26.0–34.0)
MCHC: 33.7 g/dL (ref 30.0–36.0)
MCV: 90.8 fL (ref 80.0–100.0)
Platelets: 262 10*3/uL (ref 150–400)
RBC: 4.77 MIL/uL (ref 3.87–5.11)
RDW: 14 % (ref 11.5–15.5)
WBC: 10.5 10*3/uL (ref 4.0–10.5)
nRBC: 0 % (ref 0.0–0.2)

## 2022-01-09 LAB — GLUCOSE, CAPILLARY: Glucose-Capillary: 135 mg/dL — ABNORMAL HIGH (ref 70–99)

## 2022-01-09 LAB — BASIC METABOLIC PANEL
Anion gap: 12 (ref 5–15)
BUN: 8 mg/dL (ref 6–20)
CO2: 18 mmol/L — ABNORMAL LOW (ref 22–32)
Calcium: 9.8 mg/dL (ref 8.9–10.3)
Chloride: 109 mmol/L (ref 98–111)
Creatinine, Ser: 0.81 mg/dL (ref 0.44–1.00)
GFR, Estimated: >60 mL/min (ref >60)
Glucose, Bld: 119 mg/dL — ABNORMAL HIGH (ref 70–99)
Potassium: 3.9 mmol/L (ref 3.5–5.1)
Sodium: 139 mmol/L (ref 135–145)

## 2022-01-09 LAB — LIPOPROTEIN A (LPA): Lipoprotein (a): 43.2 nmol/L — ABNORMAL HIGH (ref <75.0)

## 2022-01-09 LAB — MAGNESIUM: Magnesium: 2.1 mg/dL (ref 1.7–2.4)

## 2022-01-09 MED ORDER — NITROGLYCERIN 0.4 MG SL SUBL
0.4000 mg | SUBLINGUAL_TABLET | SUBLINGUAL | 2 refills | Status: AC | PRN
Start: 2022-01-09 — End: ?
  Filled 2022-01-09: qty 25, 30d supply, fill #0

## 2022-01-09 MED ORDER — PRASUGREL HCL 10 MG PO TABS: 10.0000 mg | ORAL_TABLET | Freq: Every day | ORAL | Status: DC

## 2022-01-09 MED ORDER — STUDY - EVOLVE-MI - EVOLOCUMAB (REPATHA) 140 MG/ML SQ INJECTION (PI-STUCKEY): 140.0000 mg | INJECTION | SUBCUTANEOUS | 0 refills | Status: DC

## 2022-01-09 MED ORDER — NICOTINE 7 MG/24HR TD PT24
7.0000 mg | MEDICATED_PATCH | Freq: Every day | TRANSDERMAL | 0 refills | Status: AC
  Filled 2022-01-09 – 2022-02-11 (×2): qty 28, 28d supply, fill #0

## 2022-01-09 MED ORDER — ROSUVASTATIN CALCIUM 40 MG PO TABS
40.0000 mg | ORAL_TABLET | Freq: Every day | ORAL | 1 refills | Status: DC
  Filled 2022-01-09: qty 30, 30d supply, fill #0
  Filled 2022-02-06: qty 30, 30d supply, fill #1

## 2022-01-09 MED ORDER — STUDY - EVOLVE-MI - EVOLOCUMAB (REPATHA) 140 MG/ML SQ INJECTION (PI-STUCKEY)
140.0000 mg | INJECTION | SUBCUTANEOUS | Status: DC
  Administered 2022-01-09: 140 mg via SUBCUTANEOUS
  Filled 2022-01-09: qty 1

## 2022-01-09 MED ORDER — PRASUGREL HCL 10 MG PO TABS
60.0000 mg | ORAL_TABLET | Freq: Once | ORAL | Status: AC
Start: 2022-01-09 — End: 2022-01-09
  Administered 2022-01-09: 60 mg via ORAL
  Filled 2022-01-09: qty 6

## 2022-01-09 MED ORDER — PRASUGREL HCL 10 MG PO TABS
10.0000 mg | ORAL_TABLET | Freq: Every day | ORAL | 2 refills | Status: DC
  Filled 2022-01-09: qty 30, 30d supply, fill #0
  Filled 2022-02-06: qty 30, 30d supply, fill #1

## 2022-01-09 NOTE — TOC Benefit Eligibility Note (Signed)
Patient Scientific laboratory technician completed.     The patient is currently admitted and upon discharge could be taking Farxiga 10mg  and Jardiance 10mg  .   PA IS REQUIRED   The patient is insured through .

## 2022-01-09 NOTE — Telephone Encounter (Signed)
Patient Advocate Encounter   Received notification that prior authorization for Brilinta 90MG  tablets is required.   PA submitted on 01/09/2022 Key [id number] Status is pending       03/11/2022, CPhT Pharmacy Patient Advocate Specialist [facility] Patient Advocate Team Direct Number: [phone]  Fax: [phone]

## 2022-01-09 NOTE — Progress Notes (Signed)
RN went over discharge papers with patient including medications and instructions. Patient had no further questions. .All valuables returned to patient and patient satisfied with belongings.  IVs taken out. Took patient via wheelchair and witnessed her get into passenger seat of vehicle with family members. Patient stable throughout discharge process. Bernardo Heater, RN also witnessed.

## 2022-01-09 NOTE — Telephone Encounter (Signed)
Pharmacy Patient Advocate Encounter  Insurance verification completed.    The patient is insured through [employer]   The patient is currently admitted and ran test claims for the following: FARXIGA 10MG  AND JARDIANCE10MG .  Copays and coinsurance results were relayed to Inpatient clinical team.

## 2022-01-09 NOTE — Discharge Summary (Addendum)
Discharge Summary    Patient ID: Jean Nelson MRN: [id number]; DOB: 08-14-1965  Admit date: 01/08/2022 Discharge date: 01/09/2022  PCP:  Medicine, [facility] Providers Cardiologist:  Larae Grooms, MD     Discharge Diagnoses    Principal Problem:   STEMI (ST elevation myocardial infarction) [facility]) Active Problems:   Acute anterior wall MI ([place])   Hypertension   Hyperlipidemia   Tobacco use  Diagnostic Studies/Procedures    Cath: 01/08/22    Dist LAD-1 lesion is 100% stenosed.  Balloon angioplasty was performed using a BALLN SAPPHIRE 2.0X15.   Post intervention, there is a 0% residual stenosis.   Dist LAD-2 lesion is 100% stenosed.   Prox RCA lesion is 100% stenosed.  Brisk right to right and left to right collaterals.   There is mild to moderate left ventricular systolic dysfunction.  Apical hypokinesis related to the distal LAD lesion.   LV end diastolic pressure is normal.   The left ventricular ejection fraction is 45-50% by visual estimate.   There is no aortic valve stenosis.   Significant two-vessel coronary artery disease including chronic total occlusion of the proximal RCA.     Culprit vessel was the occluded distal LAD which was successfully opened with balloon angioplasty.  The vessel was too small for stenting.  After flow was improved, there did appear to be embolization further down on the vessel.  This was too distal for even a balloon angioplasty.  Patient was therapeutic on heparin.  We are initiating Brilinta antiplatelet therapy for stronger effect.  Will initiate high-dose statin.   I suspect her ECG changes were due to the apical LAD occlusion compromising collaterals to the RCA.   She needs aggressive medical therapy and lifestyle modification including smoking cessation.  Diagnostic Dominance: Right  Intervention    Echo: 01/08/22  IMPRESSIONS     1. Apical akinesis with overall mild LV  dysfunction.   2. Left ventricular ejection fraction, by estimation, is 45 to 50%. The  left ventricle has mildly decreased function. The left ventricle  demonstrates regional wall motion abnormalities (see scoring  diagram/findings for description). There is moderate  left ventricular hypertrophy. Left ventricular diastolic parameters are  consistent with Grade II diastolic dysfunction (pseudonormalization).  Elevated left atrial pressure.   3. Right ventricular systolic function is normal. The right ventricular  size is normal.   4. The mitral valve is normal in structure. Trivial mitral valve  regurgitation. No evidence of mitral stenosis.   5. The aortic valve has an indeterminant number of cusps. Aortic valve  regurgitation is not visualized. No aortic stenosis is present.   6. The inferior vena cava is normal in size with greater than 50%  respiratory variability, suggesting right atrial pressure of 3 mmHg.   Comparison(s): No prior Echocardiogram.   FINDINGS   Left Ventricle: Left ventricular ejection fraction, by estimation, is 45  to 50%. The left ventricle has mildly decreased function. The left  ventricle demonstrates regional wall motion abnormalities. Definity  contrast agent was given IV to delineate the  left ventricular endocardial borders. The left ventricular internal cavity  size was normal in size. There is moderate left ventricular hypertrophy.  Left ventricular diastolic parameters are consistent with Grade II  diastolic dysfunction  (pseudonormalization). Elevated left atrial pressure.   Right Ventricle: The right ventricular size is normal. No increase in  right ventricular wall thickness. Right ventricular systolic function is  normal.  Left Atrium: Left atrial size was normal in size.   Right Atrium: Right atrial size was normal in size.   Pericardium: There is no evidence of pericardial effusion.   Mitral Valve: The mitral valve is normal in  structure. Trivial mitral  valve regurgitation. No evidence of mitral valve stenosis.   Tricuspid Valve: The tricuspid valve is normal in structure. Tricuspid  valve regurgitation is trivial. No evidence of tricuspid stenosis.   Aortic Valve: The aortic valve has an indeterminant number of cusps.  Aortic valve regurgitation is not visualized. No aortic stenosis is  present. Aortic valve mean gradient measures 4.0 mmHg. Aortic valve peak  gradient measures 8.1 mmHg. Aortic valve  area, by VTI measures 1.81 cm.   Pulmonic Valve: The pulmonic valve was not well visualized. Pulmonic valve  regurgitation is not visualized. No evidence of pulmonic stenosis.   Aorta: The aortic root is normal in size and structure.   Venous: The inferior vena cava is normal in size with greater than 50%  respiratory variability, suggesting right atrial pressure of 3 mmHg.   IAS/Shunts: No atrial level shunt detected by color flow Doppler.   Additional Comments: Apical akinesis with overall mild LV dysfunction.  _____________   History of Present Illness     Jean Nelson is a 56 y.o. female with past medical history of hypertension, hyperlipidemia, smoking, CAD with prior MI who developed chest pain the morning of 9/7 found to have a STEMI called in the field.   Reported she had had a past myocardial infarction 2 years ago while in [place].  Described what sounded like a CTO of her RCA which was not intervened upon at the time because she had collaterals.  Reported she had been on DAPT with aspirin and Plavix since.  Patient initially presented to outside hospital and found to have ST elevations and emergently transported to [facility] for further evaluation.  She was evaluated in the emergency department by Dr. Irish Lack while in route her EKG showed minimal ST elevation in V5 and V6 with persistent Q waves in inferior leads.  He was taken to the Cath Lab emergently.  Hospital Course     STEMI --  Underwent cardiac catheterization noted above with significant two-vessel CAD including a CTO of proximal RCA.  Culprit vessel was occluded distal LAD which was successfully opened with balloon angioplasty.  Vessel was too small for stenting.  Once flow improved there appeared to be distal embolization unable to be treated with balloon angioplasty.  She was placed on DAPT with aspirin/Brilinta with recommendations to continue for at least 1 year.  Unfortunately her insurance company denied coverage for her Kary Kos so she was transitioned to [employer].  She was loaded with 60 mg load of Effient with plans for aspirin and Effient 10 mg daily day after discharge.  Seen by cardiac rehab.  Follow-up echocardiogram showed LVEF of 45 to 50%, apical akinesis with moderate LVH, grade 2 diastolic dysfunction, normal RV size and function. 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 - Yes ADP Receptor Inhibitor (Plavix/Clopidogrel, Brilinta/Ticagrelor or Effient/Prasugrel) prescribed (includes medically managed patients)? - Yes Beta Blocker prescribed? - Yes High Intensity Statin (Lipitor 40-68m or Crestor 20-474m prescribed? - Yes EF assessed during THIS hospitalization? - Yes For EF <40%, was ACEI/ARB  prescribed? - Not Applicable (EF >/= 4035%For EF <40%, Aldosterone Antagonist (Spironolactone or Eplerenone) prescribed? - Not Applicable (EF >/= 4059%Cardiac Rehab Phase II ordered (including medically managed patients)? - Yes       The patient will be scheduled for a TOC follow up appointment in 10-14 days.  A message [facility] been [facility] at the office where the patient should be seen for follow up.  _____________  Discharge Vitals Blood pressure (!) 135/90, pulse 99, temperature 97.6 F (36.4 C), temperature source Oral, resp. rate 20, height _0  (1.6 m), weight 79.6 kg, last menstrual period 05/25/2016, SpO2 98 %.  Filed Weights   01/08/22 0336 01/09/22 0500  Weight: 79.4 kg 79.6 kg    Labs & Radiologic Studies    CBC Recent Labs    01/08/22 0909 01/09/22 0655  WBC 8.6 10.5  HGB 13.0 14.6  HCT 38.9 43.3  MCV 90.7 90.8  PLT 226 26741 Basic Metabolic Panel Recent Labs    01/08/22 0909 01/09/22 0655  NA 142 139  K 3.1* 3.9  CL 108 109  CO2 22 18*  GLUCOSE 121* 119*  BUN 6 8  CREATININE 0.59 0.81  CALCIUM 9.1 9.8  MG 1.6* 2.1   Liver Function Tests Recent Labs    01/08/22 0347 01/08/22 0909  AST 19 32  ALT 18 17  ALKPHOS 105 89  BILITOT 0.7 0.4  PROT 7.6 6.5  ALBUMIN 3.9 3.4*   No results for input(s): "LIPASE", "AMYLASE" in the last 72 hours. High Sensitivity Troponin:   Recent Labs  Lab 01/08/22 0340 01/08/22 0909  TROPONINIHS 8 2,010*    BNP Invalid input(s): "POCBNP" D-Dimer No results for input(s): "DDIMER" in the last 72 hours. Hemoglobin A1C Recent Labs    01/08/22 0909  HGBA1C 5.6   Fasting Lipid Panel Recent Labs    01/08/22 0911  CHOL 115  HDL 34*  LDLCALC 49  TRIG 159*  CHOLHDL 3.4   Thyroid Function Tests No results for input(s): "TSH", "T4TOTAL", "T3FREE", "THYROIDAB" in the last 72 hours.  Invalid input(s): "FREET3" _____________  ECHOCARDIOGRAM COMPLETE  Result Date: 01/08/2022     ECHOCARDIOGRAM REPORT   Patient Name:   CASHAELA BOERate of Exam: 01/08/2022 Medical Rec #:  [id number]    Height:       63.0 in Accession #:    [id number]   Weight:       175.0 lb Date of Birth:  12/1965-08-01    BSA:          1.827 m Patient Age:    [age]       BP:           134/99 mmHg Patient Gender: F  HR:           48 bpm. Exam Location:  Inpatient Procedure: 2D Echo, Cardiac Doppler, Color Doppler and Intracardiac            Opacification Agent Indications:    Myocardial infarction  History:        Patient has no prior history of Echocardiogram examinations.                 Acute MI; Risk Factors:Current Smoker. AKI.  Sonographer:    Clayton Lefort RDCS (AE) Referring Phys: [place]  1. Apical akinesis with overall mild LV dysfunction.  2. Left ventricular ejection fraction, by estimation, is 45 to 50%. The left ventricle has mildly decreased function. The left ventricle demonstrates regional wall motion abnormalities (see scoring diagram/findings for description). There is moderate left ventricular hypertrophy. Left ventricular diastolic parameters are consistent with Grade II diastolic dysfunction (pseudonormalization). Elevated left atrial pressure.  3. Right ventricular systolic function is normal. The right ventricular size is normal.  4. The mitral valve is normal in structure. Trivial mitral valve regurgitation. No evidence of mitral stenosis.  5. The aortic valve has an indeterminant number of cusps. Aortic valve regurgitation is not visualized. No aortic stenosis is present.  6. The inferior vena cava is normal in size with greater than 50% respiratory variability, suggesting right atrial pressure of 3 mmHg. Comparison(s): No prior Echocardiogram. FINDINGS  Left Ventricle: Left ventricular ejection fraction, by estimation, is 45 to 50%. The left ventricle has mildly decreased function. The left ventricle demonstrates regional wall motion abnormalities.  Definity contrast agent was given IV to delineate the left ventricular endocardial borders. The left ventricular internal cavity size was normal in size. There is moderate left ventricular hypertrophy. Left ventricular diastolic parameters are consistent with Grade II diastolic dysfunction (pseudonormalization). Elevated left atrial pressure. Right Ventricle: The right ventricular size is normal. No increase in right ventricular wall thickness. Right ventricular systolic function is normal. Left Atrium: Left atrial size was normal in size. Right Atrium: Right atrial size was normal in size. Pericardium: There is no evidence of pericardial effusion. Mitral Valve: The mitral valve is normal in structure. Trivial mitral valve regurgitation. No evidence of mitral valve stenosis. Tricuspid Valve: The tricuspid valve is normal in structure. Tricuspid valve regurgitation is trivial. No evidence of tricuspid stenosis. Aortic Valve: The aortic valve has an indeterminant number of cusps. Aortic valve regurgitation is not visualized. No aortic stenosis is present. Aortic valve mean gradient measures 4.0 mmHg. Aortic valve peak gradient measures 8.1 mmHg. Aortic valve area, by VTI measures 1.81 cm. Pulmonic Valve: The pulmonic valve was not well visualized. Pulmonic valve regurgitation is not visualized. No evidence of pulmonic stenosis. Aorta: The aortic root is normal in size and structure. Venous: The inferior vena cava is normal in size with greater than 50% respiratory variability, suggesting right atrial pressure of 3 mmHg. IAS/Shunts: No atrial level shunt detected by color flow Doppler. Additional Comments: Apical akinesis with overall mild LV dysfunction.  LEFT VENTRICLE PLAX 2D LVIDd:         4.80 cm   Diastology LVIDs:         2.90 cm   LV e' medial:    4.35 cm/s LV PW:         2.00 cm   LV E/e' medial:  17.2 LV IVS:        1.40 cm   LV e' lateral:   5.33 cm/s LVOT diam:  1.80 cm   LV E/e' lateral: 14.0 LV SV:          62 LV SV Index:   34 LVOT Area:     2.54 cm  RIGHT VENTRICLE            IVC RV Basal diam:  2.70 cm    IVC diam: 1.40 cm RV S prime:     8.49 cm/s TAPSE (M-mode): 1.7 cm LEFT ATRIUM             Index        RIGHT ATRIUM           Index LA diam:        3.00 cm 1.64 cm/m   RA Area:     12.30 cm LA Vol (A2C):   40.6 ml 22.22 ml/m  RA Volume:   29.30 ml  16.04 ml/m LA Vol (A4C):   30.3 ml 16.58 ml/m LA Biplane Vol: 36.5 ml 19.98 ml/m  AORTIC VALVE AV Area (Vmax):    1.97 cm AV Area (Vmean):   1.95 cm AV Area (VTI):     1.81 cm AV Vmax:           142.00 cm/s AV Vmean:          88.500 cm/s AV VTI:            0.341 m AV Peak Grad:      8.1 mmHg AV Mean Grad:      4.0 mmHg LVOT Vmax:         110.00 cm/s LVOT Vmean:        67.700 cm/s LVOT VTI:          0.242 m LVOT/AV VTI ratio: 0.71  AORTA Ao Root diam: 3.70 cm Ao Asc diam:  3.50 cm MITRAL VALVE MV Area (PHT): 3.50 cm    SHUNTS MV Decel Time: 217 msec    Systemic VTI:  0.24 m MV E velocity: 74.70 cm/s  Systemic Diam: 1.80 cm MV A velocity: 59.90 cm/s MV E/A ratio:  1.25 Kirk Ruths MD Electronically signed by Kirk Ruths MD Signature Date/Time: 01/08/2022/2:05:37 PM    Final    CARDIAC CATHETERIZATION  Addendum Date: 01/08/2022     Dist LAD-1 lesion is 100% stenosed.  Balloon angioplasty was performed using a BALLN SAPPHIRE 2.0X15.   Post intervention, there is a 0% residual stenosis.   Dist LAD-2 lesion is 100% stenosed.   Prox RCA lesion is 100% stenosed.  Brisk right to right and left to right collaterals.   There is mild to moderate left ventricular systolic dysfunction.  Apical hypokinesis related to the distal LAD lesion.   LV end diastolic pressure is normal.   The left ventricular ejection fraction is 45-50% by visual estimate.   There is no aortic valve stenosis. Significant two-vessel coronary artery disease including chronic total occlusion of the proximal RCA.  Culprit vessel was the occluded distal LAD which was successfully opened with  balloon angioplasty.  The vessel was too small for stenting.  After flow was improved, there did appear to be embolization further down on the vessel.  This was too distal for even a balloon angioplasty.  Patient was therapeutic on heparin.  We are initiating Brilinta antiplatelet therapy for stronger effect.  Will initiate high-dose statin. I suspect her ECG changes were due to the apical LAD occlusion compromising collaterals to the RCA. She needs aggressive medical therapy and lifestyle modification including smoking cessation.  Result Date: 01/08/2022  Dist LAD-1 lesion is 100% stenosed.  Balloon angioplasty was performed using a BALLN SAPPHIRE 2.0X15.   Post intervention, there is a 0% residual stenosis.   Dist LAD-2 lesion is 100% stenosed.   Prox RCA lesion is 100% stenosed.  Brisk right to right and left to right collaterals.   There is mild to moderate left ventricular systolic dysfunction.  Apical hypokinesis related to the distal LAD lesion.   LV end diastolic pressure is normal.   The left ventricular ejection fraction is 45-50% by visual estimate.   There is no aortic valve stenosis. Significant two-vessel coronary artery disease including chronic total occlusion of the proximal RCA.  Culprit vessel was the occluded distal LAD which was successfully opened with balloon angioplasty.  The vessel was too small for stenting.  After flow was improved, there did appear to be embolization further down on the vessel.  This was too distal for even a balloon angioplasty.  Patient was therapeutic on heparin.  We are initiating Brilinta antiplatelet therapy for stronger effect.  Will initiate high-dose statin. I suspect her ECG changes were due to the apical LAD occlusion compromising collaterals to the RCA. She needs aggressive medical therapy and lifestyle modification including smoking cessation.   DG Chest Portable 1 View  Result Date: 01/08/2022 CLINICAL DATA:  Sudden onset right chest pain. EXAM:  PORTABLE CHEST 1 VIEW COMPARISON:  PA and lateral 03/16/2011 FINDINGS: The heart size and mediastinal contours are within normal limits. Both lungs are clear. The visualized skeletal structures are unremarkable. IMPRESSION: No active disease.  Stable chest. Electronically Signed   By: Telford Nab M.D.   On: 01/08/2022 04:02    Disposition   Pt is being discharged home today in good condition.  Follow-up Plans & Appointments     Follow-up Information     Emmaline Life, NP Follow up on 01/21/2022.   Specialty: Nurse Practitioner Why: at 10:50am for your follow up appt with Dr. Morton Amy' NP Natasha Mead information: [place] [address] [phone]                Discharge Instructions     Amb Referral to Cardiac Rehabilitation   Complete by: As directed    Diagnosis: PTCA   After initial evaluation and assessments completed: Virtual Based Care may be provided alone or in conjunction with Phase 2 Cardiac Rehab based on patient barriers.: Yes   Intensive Cardiac Rehabilitation (ICR) [place] location only OR Traditional Cardiac Rehabilitation (TCR) *If criteria for ICR are not met will enroll in TCR [facility] only): Yes   Call MD for:  difficulty breathing, headache or visual disturbances   Complete by: As directed    Call MD for:  persistant dizziness or light-headedness   Complete by: As directed    Call MD for:  redness, tenderness, or signs of infection (pain, swelling, redness, odor or green/yellow discharge around incision site)   Complete by: As directed    Diet - low sodium heart healthy   Complete by: As directed    Discharge instructions   Complete by: As directed    Radial Site Care Refer to this sheet in the next few weeks. These instructions provide you with information on caring for yourself after your procedure. Your caregiver may also give you more specific instructions. Your treatment has been planned according to current medical  practices, but problems sometimes occur. Call your caregiver if you have any problems or questions after your procedure.  HOME CARE INSTRUCTIONS You may shower the day after the procedure. Remove the bandage (dressing) and gently wash the site with plain soap and water. Gently pat the site dry.  Do not apply powder or lotion to the site.  Do not submerge the affected site in water for 3 to 5 days.  Inspect the site at least twice daily.  Do not flex or bend the affected arm for 24 hours.  No lifting over 5 pounds (2.3 kg) for 5 days after your procedure.  Do not drive home if you are discharged the same day of the procedure. Have someone else drive you.  You may drive 24 hours after the procedure unless otherwise instructed by your caregiver.  What to expect: Any bruising will usually fade within 1 to 2 weeks.  Blood that collects in the tissue (hematoma) may be painful to the touch. It should usually decrease in size and tenderness within 1 to 2 weeks.  SEEK IMMEDIATE MEDICAL CARE IF: You have unusual pain at the radial site.  You have redness, warmth, swelling, or pain at the radial site.  You have drainage (other than a small amount of blood on the dressing).  You have chills.  You have a fever or persistent symptoms for more than 72 hours.  You have a fever and your symptoms suddenly get worse.  Your arm becomes pale, cool, tingly, or numb.  You have heavy bleeding from the site. Hold pressure on the site.   PLEASE DO NOT MISS ANY DOSES OF YOUR EFFIENT!!!!! Also keep a log of you blood pressures and bring back to your follow up appt. Please call the office with any questions.   Patients taking blood thinners should generally stay away from medicines like ibuprofen, Advil, Motrin, naproxen, and Aleve due to risk of stomach bleeding. You may take Tylenol as directed or talk to your primary doctor about alternatives.  PLEASE ENSURE THAT YOU DO NOT RUN OUT OF YOUR EFFIENT. This medication  is very important to remain on for at least one year. IF you have issues obtaining this medication due to cost please CALL the office 3-5 business days prior to running out in order to prevent missing doses of this medication.   Increase activity slowly   Complete by: As directed        Discharge Medications   Allergies as of 01/09/2022       Reactions   Sulfa Antibiotics Swelling   Codeine Hives        Medication List     STOP taking these medications    amphetamine-dextroamphetamine 20 MG tablet Commonly known as: ADDERALL   atorvastatin 80 MG tablet Commonly known as: LIPITOR   clopidogrel 75 MG tablet Commonly known as: PLAVIX   cyclobenzaprine 10 MG tablet Commonly known as: FLEXERIL   potassium chloride 10 MEQ tablet Commonly known as: KLOR-CON       TAKE these medications    albuterol 108 (90 Base) MCG/ACT inhaler Commonly known as: VENTOLIN HFA Inhale 1-2 puffs into the lungs every 6 (six) hours as needed for wheezing (Use the inhaler for the next 4 days 4 times a day then begin using only if needed for wheezing).   ALPRAZolam 1 MG tablet Commonly known as: XANAX TAKE 1 TABLET(1 MG) BY MOUTH THREE TIMES DAILY What changed:  how much to take how to take this when to take this reasons to take this additional instructions   amLODipine 10 MG tablet Commonly known as:  NORVASC Take 1 tablet (10 mg total) by mouth daily.   aspirin EC 81 MG tablet Take 81 mg by mouth daily.   buPROPion 300 MG 24 hr tablet Commonly known as: Wellbutrin XL Take 1 tablet (300 mg total) by mouth every morning.   busPIRone 10 MG tablet Commonly known as: BUSPAR Take 1 tablet (10 mg total) by mouth 3 (three) times daily. What changed:  when to take this reasons to take this   cholecalciferol 25 MCG (1000 UNIT) tablet Commonly known as: VITAMIN D3 Take 1,000 Units by mouth daily.   EVOLVE-MI evolocumab 140 mg/1 mL SQ injection Inject 1 mL (140 mg total) into the  skin every 14 (fourteen) days. For Investigational Use Only. Inject subcutaneously into abdomen, thigh, or upper arm every 14 days. Rotate injection sites and do not inject into areas where skin is tender, bruised, or red. Please contact [facility] regarding any questions or concerns regarding this medication.   HYDROcodone bit-homatropine 5-1.5 MG/5ML syrup Commonly known as: HYCODAN Take 5 mLs by mouth daily as needed for cough.   lisinopril 20 MG tablet Commonly known as: ZESTRIL Take 20 mg by mouth 2 (two) times daily.   metoprolol tartrate 25 MG tablet Commonly known as: LOPRESSOR Take 25 mg by mouth 2 (two) times daily.   nicotine 7 mg/24hr patch Commonly known as: NICODERM CQ - dosed in mg/24 hr Place 1 patch (7 mg total) onto the skin daily. Start taking on: January 10, 2022   nitroGLYCERIN 0.4 MG SL tablet Commonly known as: NITROSTAT Place 1 tablet (0.4 mg total) under the tongue every 5 (five) minutes x 3 doses as needed for chest pain.   prasugrel 10 MG Tabs tablet Commonly known as: EFFIENT Take 1 tablet (10 mg total) by mouth daily. Start taking on: January 10, 2022   rosuvastatin 40 MG tablet Commonly known as: CRESTOR Take 1 tablet (40 mg total) by mouth daily. Start taking on: January 10, 2022   traZODone 50 MG tablet Commonly known as: DESYREL TAKE 1 TABLET BY MOUTH EVERYDAY AT BEDTIME What changed:  how much to take how to take this when to take this reasons to take this additional instructions   vitamin B-12 500 MCG tablet Commonly known as: CYANOCOBALAMIN Take 500 mcg by mouth daily.        Outstanding Labs/Studies   FLP/LFTs in 8 weeks   Duration of Discharge Encounter   Greater than 30 minutes including physician time.  Signed, Reino Bellis, NP 01/09/2022, 3:41 PM    I have examined the patient and reviewed assessment and plan and discussed with patient.  Agree with above as stated.     No chest pain.   Telemetry reviewed-no significant arrhythmias.  She has walked and felt well.  She wants to go home today.  Blood pressure better controlled.  Checking to see affordability of Brilinta versus prasugrel.  Prasugrel will be more affordable than Brilinta after the pharmacy look into it.  Would like to upgrade antiplatelet therapy as her event happened while she was on clopidogrel.  Chronic total occlusion of the RCA.  Continue medical therapy.  If she had further angina, could consider intervention attempt.  EF 45 to 50%.  No signs of heart failure on exam.  Smoking cessation with nicotine patch.  High-dose statin started as well.  Healthy diet and regular exercise encouraged.  OK to discharge.   Larae Grooms

## 2022-01-09 NOTE — Research (Signed)
EVOLVE MI Informed Consent   Subject Name: Jean Nelson  Subject met inclusion and exclusion criteria.  The informed consent form, study requirements and expectations were reviewed with the subject and questions and concerns were addressed prior to the signing of the consent form.  The subject verbalized understanding of the trial requirements.  The subject agreed to participate in the EVOLVE MI trial and signed the informed consent at 130pm on 01/09/2022.  The informed consent was obtained prior to performance of any protocol-specific procedures for the subject.  A copy of the signed informed consent was given to the subject and a copy was placed in the subject's medical record.   Berneda Rose       Protocol # [id number]   Subject ID#  [id number]                             DAY 1 Date:        12-10-2021  Randomization:   Geographic Region:    [place]  Randomization Date:     09-Jan-2022  Randomization Time:       Treatment type Assigned   '[x]'  Treatment                                   '[]'   Control       Protocol # [id number]  Subject Initial ID#:  [id number]                 Age in years: 45  *Demographics are found in the [facility] EMR source.   Protocol Version:   Were all Eligibility Criteria Met?  '[x]'  Yes  '[]'   No  Screening Visit Date:                09-Jan-2022

## 2022-01-09 NOTE — Progress Notes (Signed)
CARDIAC REHAB PHASE I   PRE:  Rate/Rhythm: 100 ST   BP:  Sitting: 119/78      SaO2: 96 RA  MODE:  Ambulation: 370 ft   POST:  Rate/Rhythm: 92 SR  BP:  Sitting: 137/85      SaO2: 97 RA   Pt ambulated in hall independently with no CP or SOB. Back to room to chair with call bell and bedside table in reach. Post MI/PTCA education including site care, risk factors, heart healthy diet,restrictions, MI booklet, exercise guidelines, antiplatelet therapy importance, and CRP2 reviewed. Will refer to AP for CRP2. All questions and concerns addressed. Will continue to follow.   [id number]  Jean Chen, RN BSN 01/09/2022 9:09 AM

## 2022-01-09 NOTE — Progress Notes (Addendum)
Rounding Note    Patient Name: Jean Nelson Date of Encounter: 01/09/2022  [facility] Cardiologist: None   Subjective   Patient reports feeling well this morning. Denies any chest pain or dyspnea. Able to ambulate around unit without symptoms.   Inpatient Medications    Scheduled Meds:  amLODipine  10 mg Oral Daily   aspirin  81 mg Oral Daily   buPROPion  300 mg Oral Daily   busPIRone  10 mg Oral TID   Chlorhexidine Gluconate Cloth  6 each Topical Daily   heparin  5,000 Units Subcutaneous Q8H   lisinopril  20 mg Oral BID   metoprolol tartrate  25 mg Oral BID   nicotine  7 mg Transdermal Daily   potassium chloride  10 mEq Oral Daily   rosuvastatin  40 mg Oral Daily   sodium chloride flush  3 mL Intravenous Q12H   ticagrelor  90 mg Oral BID   Continuous Infusions:  sodium chloride Stopped (01/08/22 0406)   sodium chloride     PRN Meds: sodium chloride, acetaminophen, albuterol, ALPRAZolam, cyclobenzaprine, nitroGLYCERIN, ondansetron (ZOFRAN) IV, mouth rinse, sodium chloride flush, traZODone   Vital Signs    Vitals:   01/09/22 0618 01/09/22 0700 01/09/22 0755 01/09/22 0800  BP: 115/83 (!) 134/100  (!) 135/90  Pulse: 74 84  99  Resp: (!) 9 18  20   Temp:   98.1 F (36.7 C)   TempSrc:   Oral   SpO2: 97% 96%  98%  Weight:      Height:        Intake/Output Summary (Last 24 hours) at 01/09/2022 1143 Last data filed at 01/09/2022 0400 Gross per 24 hour  Intake 100.14 ml  Output 1300 ml  Net -1199.86 ml      01/09/2022    5:00 AM 01/08/2022    3:36 AM 06/24/2020    7:01 PM  Last 3 Weights  Weight (lbs) 175 lb 6.4 oz 175 lb 170 lb  Weight (kg) 79.561 kg 79.379 kg 77.111 kg      Telemetry    NSR - Personally Reviewed  ECG    Sinus rhythm, HR 74, Q waves in inferior leads, T wave inversions - Personally Reviewed  Physical Exam   GEN: No acute distress.   Cardiac: RRR, no murmurs, rubs, or gallops. 2+ distal pulses.  Respiratory: Normal work of  breathing. Clear to ausculation bilaterally.  MS: No edema; No deformity. Neuro:  alert and oriented   Psych: Normal affect   Labs    High Sensitivity Troponin:   Recent Labs  Lab 01/08/22 0340 01/08/22 0909  TROPONINIHS 8 2,010*      Chemistry Recent Labs  Lab 01/08/22 0347 01/08/22 0909 01/09/22 0655  NA 141 142 139  K 2.7* 3.1* 3.9  CL 109 108 109  CO2 24 22 18*  GLUCOSE 121* 121* 119*  BUN 9 6 8   CREATININE 0.71 0.59 0.81  CALCIUM 8.9 9.1 9.8  MG  --  1.6* 2.1  PROT 7.6 6.5  --   ALBUMIN 3.9 3.4*  --   AST 19 32  --   ALT 18 17  --   ALKPHOS 105 89  --   BILITOT 0.7 0.4  --   GFRNONAA >60 >60 >60  ANIONGAP 8 12 12      Lipids  Recent Labs  Lab 01/08/22 0911  CHOL 115  TRIG 159*  HDL 34*  LDLCALC 49  CHOLHDL 3.4  Hematology Recent Labs  Lab 01/08/22 0340 01/08/22 0909 01/09/22 0655  WBC 8.9 8.6 10.5  RBC 4.58 4.29 4.77  HGB 14.0 13.0 14.6  HCT 41.9 38.9 43.3  MCV 91.5 90.7 90.8  MCH 30.6 30.3 30.6  MCHC 33.4 33.4 33.7  RDW 13.9 13.8 14.0  PLT 220 226 262    Thyroid No results for input(s): "TSH", "FREET4" in the last 168 hours.  BNPNo results for input(s): "BNP", "PROBNP" in the last 168 hours.  DDimer No results for input(s): "DDIMER" in the last 168 hours.   Radiology    ECHOCARDIOGRAM COMPLETE  Result Date: 01/08/2022    ECHOCARDIOGRAM REPORT   Patient Name:   Jean Nelson Date of Exam: 01/08/2022 Medical Rec #:  [id number]      Height:       63.0 in Accession #:    [id number]     Weight:       175.0 lb Date of Birth:  05-Sep-1965      BSA:          1.827 m Patient Age:    56 years       BP:           134/99 mmHg Patient Gender: F              HR:           48 bpm. Exam Location:  Inpatient Procedure: 2D Echo, Cardiac Doppler, Color Doppler and Intracardiac            Opacification Agent Indications:    Myocardial infarction  History:        Patient has no prior history of Echocardiogram examinations.                 Acute MI; Risk  Factors:Current Smoker. AKI.  Sonographer:    Ross Ludwig RDCS (AE) Referring Phys: [id number] Donnie Coffin Ledonna Dormer IMPRESSIONS  1. Apical akinesis with overall mild LV dysfunction.  2. Left ventricular ejection fraction, by estimation, is 45 to 50%. The left ventricle has mildly decreased function. The left ventricle demonstrates regional wall motion abnormalities (see scoring diagram/findings for description). There is moderate left ventricular hypertrophy. Left ventricular diastolic parameters are consistent with Grade II diastolic dysfunction (pseudonormalization). Elevated left atrial pressure.  3. Right ventricular systolic function is normal. The right ventricular size is normal.  4. The mitral valve is normal in structure. Trivial mitral valve regurgitation. No evidence of mitral stenosis.  5. The aortic valve has an indeterminant number of cusps. Aortic valve regurgitation is not visualized. No aortic stenosis is present.  6. The inferior vena cava is normal in size with greater than 50% respiratory variability, suggesting right atrial pressure of 3 mmHg. Comparison(s): No prior Echocardiogram. FINDINGS  Left Ventricle: Left ventricular ejection fraction, by estimation, is 45 to 50%. The left ventricle has mildly decreased function. The left ventricle demonstrates regional wall motion abnormalities. Definity contrast agent was given IV to delineate the left ventricular endocardial borders. The left ventricular internal cavity size was normal in size. There is moderate left ventricular hypertrophy. Left ventricular diastolic parameters are consistent with Grade II diastolic dysfunction (pseudonormalization). Elevated left atrial pressure. Right Ventricle: The right ventricular size is normal. No increase in right ventricular wall thickness. Right ventricular systolic function is normal. Left Atrium: Left atrial size was normal in size. Right Atrium: Right atrial size was normal in size. Pericardium: There is no  evidence of pericardial effusion. Mitral Valve: The mitral valve is  normal in structure. Trivial mitral valve regurgitation. No evidence of mitral valve stenosis. Tricuspid Valve: The tricuspid valve is normal in structure. Tricuspid valve regurgitation is trivial. No evidence of tricuspid stenosis. Aortic Valve: The aortic valve has an indeterminant number of cusps. Aortic valve regurgitation is not visualized. No aortic stenosis is present. Aortic valve mean gradient measures 4.0 mmHg. Aortic valve peak gradient measures 8.1 mmHg. Aortic valve area, by VTI measures 1.81 cm. Pulmonic Valve: The pulmonic valve was not well visualized. Pulmonic valve regurgitation is not visualized. No evidence of pulmonic stenosis. Aorta: The aortic root is normal in size and structure. Venous: The inferior vena cava is normal in size with greater than 50% respiratory variability, suggesting right atrial pressure of 3 mmHg. IAS/Shunts: No atrial level shunt detected by color flow Doppler. Additional Comments: Apical akinesis with overall mild LV dysfunction.  LEFT VENTRICLE PLAX 2D LVIDd:         4.80 cm   Diastology LVIDs:         2.90 cm   LV e' medial:    4.35 cm/s LV PW:         2.00 cm   LV E/e' medial:  17.2 LV IVS:        1.40 cm   LV e' lateral:   5.33 cm/s LVOT diam:     1.80 cm   LV E/e' lateral: 14.0 LV SV:         62 LV SV Index:   34 LVOT Area:     2.54 cm  RIGHT VENTRICLE            IVC RV Basal diam:  2.70 cm    IVC diam: 1.40 cm RV S prime:     8.49 cm/s TAPSE (M-mode): 1.7 cm LEFT ATRIUM             Index        RIGHT ATRIUM           Index LA diam:        3.00 cm 1.64 cm/m   RA Area:     12.30 cm LA Vol (A2C):   40.6 ml 22.22 ml/m  RA Volume:   29.30 ml  16.04 ml/m LA Vol (A4C):   30.3 ml 16.58 ml/m LA Biplane Vol: 36.5 ml 19.98 ml/m  AORTIC VALVE AV Area (Vmax):    1.97 cm AV Area (Vmean):   1.95 cm AV Area (VTI):     1.81 cm AV Vmax:           142.00 cm/s AV Vmean:          88.500 cm/s AV VTI:             0.341 m AV Peak Grad:      8.1 mmHg AV Mean Grad:      4.0 mmHg LVOT Vmax:         110.00 cm/s LVOT Vmean:        67.700 cm/s LVOT VTI:          0.242 m LVOT/AV VTI ratio: 0.71  AORTA Ao Root diam: 3.70 cm Ao Asc diam:  3.50 cm MITRAL VALVE MV Area (PHT): 3.50 cm    SHUNTS MV Decel Time: 217 msec    Systemic VTI:  0.24 m MV E velocity: 74.70 cm/s  Systemic Diam: 1.80 cm MV A velocity: 59.90 cm/s MV E/A ratio:  1.25 Olga Millers MD Electronically signed by Olga Millers MD Signature Date/Time: 01/08/2022/2:05:37 PM  Final    CARDIAC CATHETERIZATION  Addendum Date: 01/08/2022     Dist LAD-1 lesion is 100% stenosed.  Balloon angioplasty was performed using a BALLN SAPPHIRE 2.0X15.   Post intervention, there is a 0% residual stenosis.   Dist LAD-2 lesion is 100% stenosed.   Prox RCA lesion is 100% stenosed.  Brisk right to right and left to right collaterals.   There is mild to moderate left ventricular systolic dysfunction.  Apical hypokinesis related to the distal LAD lesion.   LV end diastolic pressure is normal.   The left ventricular ejection fraction is 45-50% by visual estimate.   There is no aortic valve stenosis. Significant two-vessel coronary artery disease including chronic total occlusion of the proximal RCA.  Culprit vessel was the occluded distal LAD which was successfully opened with balloon angioplasty.  The vessel was too small for stenting.  After flow was improved, there did appear to be embolization further down on the vessel.  This was too distal for even a balloon angioplasty.  Patient was therapeutic on heparin.  We are initiating Brilinta antiplatelet therapy for stronger effect.  Will initiate high-dose statin. I suspect her ECG changes were due to the apical LAD occlusion compromising collaterals to the RCA. She needs aggressive medical therapy and lifestyle modification including smoking cessation.  Result Date: 01/08/2022   Dist LAD-1 lesion is 100% stenosed.  Balloon angioplasty  was performed using a BALLN SAPPHIRE 2.0X15.   Post intervention, there is a 0% residual stenosis.   Dist LAD-2 lesion is 100% stenosed.   Prox RCA lesion is 100% stenosed.  Brisk right to right and left to right collaterals.   There is mild to moderate left ventricular systolic dysfunction.  Apical hypokinesis related to the distal LAD lesion.   LV end diastolic pressure is normal.   The left ventricular ejection fraction is 45-50% by visual estimate.   There is no aortic valve stenosis. Significant two-vessel coronary artery disease including chronic total occlusion of the proximal RCA.  Culprit vessel was the occluded distal LAD which was successfully opened with balloon angioplasty.  The vessel was too small for stenting.  After flow was improved, there did appear to be embolization further down on the vessel.  This was too distal for even a balloon angioplasty.  Patient was therapeutic on heparin.  We are initiating Brilinta antiplatelet therapy for stronger effect.  Will initiate high-dose statin. I suspect her ECG changes were due to the apical LAD occlusion compromising collaterals to the RCA. She needs aggressive medical therapy and lifestyle modification including smoking cessation.   DG Chest Portable 1 View  Result Date: 01/08/2022 CLINICAL DATA:  Sudden onset right chest pain. EXAM: PORTABLE CHEST 1 VIEW COMPARISON:  PA and lateral 03/16/2011 FINDINGS: The heart size and mediastinal contours are within normal limits. Both lungs are clear. The visualized skeletal structures are unremarkable. IMPRESSION: No active disease.  Stable chest. Electronically Signed   By: Almira Bar M.D.   On: 01/08/2022 04:02    Cardiac Studies   9/7 Echo 1. Apical akinesis with overall mild LV dysfunction.   2. Left ventricular ejection fraction, by estimation, is 45 to 50%. The  left ventricle has mildly decreased function. The left ventricle  demonstrates regional wall motion abnormalities (see scoring   diagram/findings for description). There is moderate  left ventricular hypertrophy. Left ventricular diastolic parameters are  consistent with Grade II diastolic dysfunction (pseudonormalization).  Elevated left atrial pressure.   3. Right ventricular systolic  function is normal. The right ventricular  size is normal.   4. The mitral valve is normal in structure. Trivial mitral valve  regurgitation. No evidence of mitral stenosis.   5. The aortic valve has an indeterminant number of cusps. Aortic valve  regurgitation is not visualized. No aortic stenosis is present.   6. The inferior vena cava is normal in size with greater than 50%  respiratory variability, suggesting right atrial pressure of 3 mmHg.   Cardiac Cath      Dist LAD-1 lesion is 100% stenosed.  Balloon angioplasty was performed using a BALLN SAPPHIRE 2.0X15.   Post intervention, there is a 0% residual stenosis.   Dist LAD-2 lesion is 100% stenosed.   Prox RCA lesion is 100% stenosed.  Brisk right to right and left to right collaterals.   There is mild to moderate left ventricular systolic dysfunction.  Apical hypokinesis related to the distal LAD lesion.   LV end diastolic pressure is normal.   The left ventricular ejection fraction is 45-50% by visual estimate.   There is no aortic valve stenosis.   Significant two-vessel coronary artery disease including chronic total occlusion of the proximal RCA.     Culprit vessel was the occluded distal LAD which was successfully opened with balloon angioplasty.  The vessel was too small for stenting.  After flow was improved, there did appear to be embolization further down on the vessel.  This was too distal for even a balloon angioplasty.  Patient was therapeutic on heparin.  We are initiating Brilinta antiplatelet therapy for stronger effect.  Will initiate high-dose statin. I suspect her ECG changes were due to the apical LAD occlusion compromising collaterals to the RCA.  Patient  Profile     56 y.o. female with hx of chronic back pain, HTN, HLD and CAD s/p prior MI 2 years ago and active smoking presented with acute chest pain found to have STEMI s/p PTCA.   Assessment & Plan    STEMI S/p balloon angioplasty of occluded distal LAD.  Lipid panel: LDL 49, TCG 159, HDL 34. A1c is 5.6. Echo showed EF 45-50%, apical akinesis, regional wall motion abnormalities, LVH, grade 2 diastolic dysfunction with elevated LA pressure.  -brilinta 90 mg BID (at discharge with either Brilinta or Effient based on insurance coverage) -ASA 81 mg daily -lopressor 25 mg BID -rosuvastatin 40 mg daily -lisinopril 20 mg BID -continue cardiac rehab -consider starting SGLT2 inhibitor, maybe as outpatient -pharmacist discussed with patient about stopping adderall, patient agrees  -plan for possible discharge later today  2. Hypertension BP improved with current regimen. 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 History of hypokalemia prior to admission and patient notes other family members have issue with low potassium as well.  -on lisinopril, amlodipine and lopressor -pending aldosterone renin ratio -potassium 10 mEq daily, monitor BMP  3. Tobacco Use Discussed and encouraged smoking cessation. Patient is interested in stopping. Nicotine patch n.p.o. nicotine persistent hypokalemia.  Aggressive repletion. Goal potassium of 4, magnesium of 2. -nicotine patch 7 mg   For questions or updates, please contact [facility] Please consult [email or web address] for contact info under        Signed, Rana SnareMichael Zheng, DO  01/09/2022, 11:43 AM     I have examined the patient and reviewed assessment and plan and discussed with patient.  Agree with above as stated.    No chest pain.  Telemetry reviewed-no significant arrhythmias.  She has walked and felt well.  She wants to go  home today.  Blood pressure better controlled.  Checking to see affordability of Brilinta versus Effient.  Would like to  upgrade antiplatelet therapy as her event happened while she was on clopidogrel.  Chronic total occlusion of the RCA.  Continue medical therapy.  If she had further angina, could consider intervention attempt.  EF 45 to 50%.  No signs of heart failure on exam.  Smoking cessation with nicotine patch.  High-dose statin started as well.  Healthy diet and regular exercise encouraged.  If she does well during the day, could plan for discharge later in the evening.  Lance Muss

## 2022-01-09 NOTE — Telephone Encounter (Signed)
Patient Advocate Encounter  Received notification that the request for prior authorization for Brilinta 90MG  tablets  has been denied due to Formulary alternative(s) are clopidogrel, prasugrel. Requirement: 3 in a class with 3 or more alternatives, 2 in a class with 2 alternatives, or 1 in a class with only 1 alternative. Please refer to your plan documents for a complete list of alternatives.       , CPhT Pharmacy Patient Advocate Specialist [facility] Patient Advocate Team Direct Number: [phone]  Fax: [phone]

## 2022-01-10 LAB — LIPOPROTEIN A (LPA): Lipoprotein (a): 50.4 nmol/L — ABNORMAL HIGH (ref <75.0)

## 2022-01-14 LAB — ALDOSTERONE + RENIN ACTIVITY W/ RATIO
ALDO / PRA Ratio: 7.7 (ref 0.0–30.0)
Aldosterone: 2.3 ng/dL (ref 0.0–30.0)
PRA LC/MS/MS: 0.297 ng/mL/hr (ref 0.167–5.380)

## 2022-01-18 NOTE — Progress Notes (Unsigned)
Cardiology Office Note:    Date:  01/21/2022   ID:  Jean Nelson, DOB 10-Mar-1966, MRN [id number]  PCP:  Medicine, [facility] Providers Cardiologist:  Larae Grooms, MD     Referring MD: Medicine, [place]*   Chief Complaint: follow-up CAD s/p STEMI  History of Present Illness:    Jean Nelson is a very pleasant 56 y.o. female with a hx of CAD s/p STEMI, prior acute MI 2 years prior to 2023 in [place] with what sounds like a CTO of RCA with no intervention given collaterals, hypertension, hyperlipidemia, tobacco abuse,  She presented 01/08/22 with chest pain that started at 1 AM that day. Was seen at outside ED and transported to [facility]. While en route, EKG with minimal ST elevations in V5-V6, persistent Q waves in inferior leads. Taken emergently to cath lab with evidence of obstructive disease in distal LAD with left-to-right collaterals, evidence of obstructive disease in mid to distal circumflex, CTO of proximal RCA, rapid reconstitution of flow from collaterals, elevated EDP of 17.  Decision was made to proceed with LAD intervention with PTCA with resolution of chest pain at the end of procedure. Plan to continue DAPT indefinitely. Brilinta not covered by insurance, so she is on Effient and aspirin. Mildly reduced LVEF on echo-%.    Today, she is here with her friend. Reports she is overall feeling well but continues to have occasional random bouts of sharp shooting chest pains. These are not persistent and do not occur very frequently.  Reports shortness of breath when lying down, + orthopnea, sleeps on a wedge pillow. Also has pain in left shoulder blade.  No edema, weight gain.  Felt better while in the hospital. Has been afraid to be active, drove for the first time yesterday. Walking around her house but not walking for exercise. Works a part-time job, has not returned. Typical home BP prior to coronary intervention was very high. Since hospital  d/c BP home [phone] systolic and 95-09 diastolic. She denies palpitations, presyncope, syncope.  No bleeding concerns. We discussed medications in detail. Reports she has not been taking amlodipine.  Past Medical History:  Diagnosis Date   Anxiety    Chronic back pain    Essential hypertension    Heart attack ([place])    Plantar fasciitis    Sciatica     Past Surgical History:  Procedure Laterality Date   APPENDECTOMY     CARDIAC CATHETERIZATION     CORONARY/GRAFT ACUTE MI REVASCULARIZATION N/A 01/08/2022   Procedure: Coronary/Graft Acute MI Revascularization;  Surgeon: Jettie Booze, MD;  Location: [facility];  Service: Cardiovascular;  Laterality: N/A;   LEFT HEART CATH AND CORONARY ANGIOGRAPHY N/A 01/08/2022   Procedure: LEFT HEART CATH AND CORONARY ANGIOGRAPHY;  Surgeon: Jettie Booze, MD;  Location: [facility];  Service: Cardiovascular;  Laterality: N/A;   TUBAL LIGATION      Current Medications: Current Meds  Medication Sig   ALPRAZolam (XANAX) 1 MG tablet TAKE 1 TABLET(1 MG) BY MOUTH THREE TIMES DAILY (Patient taking differently: Take 1 mg by mouth 3 (three) times daily as needed for anxiety.)   aspirin EC 81 MG tablet Take 81 mg by mouth daily.   buPROPion (WELLBUTRIN XL) 300 MG 24 hr tablet Take 1 tablet (300 mg total) by mouth every morning.   cholecalciferol (VITAMIN D3) 25 MCG (1000 UNIT) tablet Take 1,000 Units by mouth daily.   lisinopril (ZESTRIL)  20 MG tablet Take 20 mg by mouth 2 (two) times daily.   metoprolol tartrate (LOPRESSOR) 25 MG tablet Take 25 mg by mouth 2 (two) times daily.   nicotine (NICODERM CQ - DOSED IN MG/24 HR) 7 mg/24hr patch Place 1 patch (7 mg total) onto the skin daily.   nitroGLYCERIN (NITROSTAT) 0.4 MG SL tablet Place 1 tablet (0.4 mg total) under the tongue every 5 (five) minutes x 3 doses as needed for chest pain.   prasugrel (EFFIENT) 10 MG TABS tablet Take 1 tablet (10 mg total) by mouth daily.   rosuvastatin  (CRESTOR) 40 MG tablet Take 1 tablet (40 mg total) by mouth daily.   sacubitril-valsartan (ENTRESTO) 24-26 MG Take 1 tablet by mouth 2 (two) times daily.   Study - EVOLVE-MI - evolocumab (REPATHA) 140 mg/mL SQ injection (PI-Stuckey) Inject 1 mL (140 mg total) into the skin every 14 (fourteen) days. For Investigational Use Only. Inject subcutaneously into abdomen, thigh, or upper arm every 14 days. Rotate injection sites and do not inject into areas where skin is tender, bruised, or red. Please contact [facility] regarding any questions or concerns regarding this medication.   traZODone (DESYREL) 50 MG tablet TAKE 1 TABLET BY MOUTH EVERYDAY AT BEDTIME   vitamin B-12 (CYANOCOBALAMIN) 500 MCG tablet Take 500 mcg by mouth daily.   [DISCONTINUED] amLODipine (NORVASC) 10 MG tablet Take 1 tablet (10 mg total) by mouth daily.     Allergies:   Sulfa antibiotics and Codeine   Social History   Socioeconomic History   Marital status: Married    Spouse name: Not on file   Number of children: Not on file   Years of education: Not on file   Highest education level: Not on file  Occupational History   Not on file  Tobacco Use   Smoking status: Every Day    Packs/day: 0.50    Types: Cigarettes   Smokeless tobacco: Never  Vaping Use   Vaping Use: Never used  Substance and Sexual Activity   Alcohol use: Yes    Comment: occasionally    Drug use: No   Sexual activity: Yes    Birth control/protection: Surgical  Other Topics Concern   Not on file  Social History Narrative   Not on file   Social Determinants of Health   Financial Resource Strain: Not on file  Food Insecurity: Not on file  Transportation Needs: Not on file  Physical Activity: Not on file  Stress: Not on file  Social Connections: Not on file     Family History: The patient's family history includes ADD / ADHD in her mother; Alcohol abuse in her maternal grandfather and maternal uncle; Depression in her  maternal grandfather; Hypertension in her mother.  ROS:   Please see the history of present illness.    + orthopnea + SOB All other systems reviewed and are negative.  Labs/Other Studies Reviewed:    The following studies were reviewed today:  LHC 01/08/22    Dist LAD-1 lesion is 100% stenosed.  Balloon angioplasty was performed using a BALLN SAPPHIRE 2.0X15.   Post intervention, there is a 0% residual stenosis.   Dist LAD-2 lesion is 100% stenosed.   Prox RCA lesion is 100% stenosed.  Brisk right to right and left to right collaterals.   There is mild to moderate left ventricular systolic dysfunction.  Apical hypokinesis related to the distal LAD lesion.   LV end diastolic pressure is normal.   The left  ventricular ejection fraction is 45-50% by visual estimate.   There is no aortic valve stenosis.   Significant two-vessel coronary artery disease including chronic total occlusion of the proximal RCA.     Culprit vessel was the occluded distal LAD which was successfully opened with balloon angioplasty.  The vessel was too small for stenting.  After flow was improved, there did appear to be embolization further down on the vessel.  This was too distal for even a balloon angioplasty.  Patient was therapeutic on heparin.  We are initiating Brilinta antiplatelet therapy for stronger effect.  Will initiate high-dose statin.   I suspect her ECG changes were due to the apical LAD occlusion compromising collaterals to the RCA.   She needs aggressive medical therapy and lifestyle modification including smoking cessation.   Diagnostic Dominance: Right  Intervention     Echo 01/08/22  1. Apical akinesis with overall mild LV dysfunction.   2. Left ventricular ejection fraction, by estimation, is 45 to 50%. The  left ventricle has mildly decreased function. The left ventricle  demonstrates regional wall motion abnormalities (see scoring  diagram/findings for description). There is  moderate  left ventricular hypertrophy. Left ventricular diastolic parameters are  consistent with Grade II diastolic dysfunction (pseudonormalization).  Elevated left atrial pressure.   3. Right ventricular systolic function is normal. The right ventricular  size is normal.   4. The mitral valve is normal in structure. Trivial mitral valve  regurgitation. No evidence of mitral stenosis.   5. The aortic valve has an indeterminant number of cusps. Aortic valve  regurgitation is not visualized. No aortic stenosis is present.   6. The inferior vena cava is normal in size with greater than 50%  respiratory variability, suggesting right atrial pressure of 3 mmHg.   Comparison(s): No prior Echocardiogram.    Recent Labs: 01/08/2022: ALT 17 01/09/2022: BUN 8; Creatinine, Ser 0.81; Hemoglobin 14.6; Magnesium 2.1; Platelets 262; Potassium 3.9; Sodium 139  Recent Lipid Panel    Component Value Date/Time   CHOL 115 01/08/2022 0911   TRIG 159 (H) 01/08/2022 0911   HDL 34 (L) 01/08/2022 0911   CHOLHDL 3.4 01/08/2022 0911   VLDL 32 01/08/2022 0911   LDLCALC 49 01/08/2022 0911     Risk Assessment/Calculations:      Physical Exam:    VS:  BP (!) 140/78   Pulse (!) 56   Ht 5\' 3"  (1.6 m)   Wt 182 lb (82.6 kg)   LMP 05/25/2016   SpO2 98%   BMI 32.24 kg/m     Wt Readings from Last 3 Encounters:  01/21/22 182 lb (82.6 kg)  01/09/22 175 lb 6.4 oz (79.6 kg)  06/24/20 170 lb (77.1 kg)     GEN: Well nourished, well developed in no acute distress HEENT: Normal NECK: No JVD; No carotid bruits CARDIAC: RRR, no murmurs, rubs, gallops RESPIRATORY:  Coarse breath sounds bilaterally without wheezing or rhonchi  ABDOMEN: Soft, non-tender, non-distended MUSCULOSKELETAL:  No edema; No deformity. 2+ pedal pulses, equal bilaterally SKIN: Warm and dry NEUROLOGIC:  Alert and oriented x 3 PSYCHIATRIC:  Normal affect   EKG:  EKG is ordered today.  The ekg ordered today demonstrates sinus bradycardia  at 56 bpm, persistent Q waves inferior and anterior leads, TWI III, aVF, V4-V6, no acute change from previous tracing  Diagnoses:    1. ST elevation myocardial infarction involving left anterior descending (LAD) coronary artery (HCC)   2. Coronary artery disease involving native coronary artery of  native heart without angina pectoris   3. Hyperlipidemia LDL goal <70   4. Essential hypertension   5. Shortness of breath   6. Chronic combined systolic and diastolic CHF, NYHA class 2 (HCC)   7. Tobacco use    Assessment and Plan:     CAD s/p STEMI: Presented with chest pain, found to have distantly occluded LAD which was successfully opened with balloon angioplasty.  Vessel too small for stenting.  After flow improved, appearance of embolization further down the vessel too small for balloon angioplasty. Treated with heparin and Brilinta with resolution of chest pain. DAPT with Effient (due to insurance) and ASA. Random sharp shooting chest pains that are not persistent and do not worsen with exertion. Has SOB as noted below. I do not feel this is anginal in nature.  Encouraged her to gradually increase activity including walking for at least 15 minutes daily with plan to increase to 30 minutes 5 days per week.  She denies significant bruising, swelling, or pain at femoral site of catheterization. Strongly encouraged Cardiac Rehab and she is agreeable to participate.  Continue Effient, aspirin, lipid therapy. Will restart amlodipine at 5 mg daily.   Hypertension: BP is mildly elevated today. Home BP readings also consistently elevated. Aldosterone/renin ration normal during hospitalization, not contributing to elevated BP.  Restarting amlodipine at 5 mg daily. We will d/c lisinopril and start Entresto 24-26 mg twice daily for CHF. Advised her to continue to monitor BP.   Hyperlipidemia LDL goal < 70: Lipo(a) is normal. LDL 49 on 01/08/22. Emphasized the importance of continued well-controlled lipids.  Enrolled in EVOLVE MI study.   Chronic combined CHF: NYHA Class II. Mildly reduced LVEF 45-50%, G2DD on echo 01/08/22. Reports symptoms of SOB, orthopnea. No edema or PND. Appears euvolemic on exam. Marguerita Merles 24-26 twice daily for symptoms of CHF, better BP control. Will d/c lisinopril for 2 days then start Entresto. We will check  bmet 10 days after start of Entresto. Also encouraged her to get screening CT for lung cancer. She prefers to arrange with her PCP.  Tobacco abuse: Is taking Wellbutrin. Was previously smoking 2 pack/day. Smoked 6 cigarettes yesterday. Reports commitment to quit. Advised her to call back if she needs additional resources for complete cessation.      Cardiac Rehabilitation Eligibility Assessment  The patient is ready to start cardiac rehabilitation from a cardiac standpoint.     Disposition: 3-4 months with Dr. Eldridge Dace  Medication Adjustments/Labs and Tests Ordered: Current medicines are reviewed at length with the patient today.  Concerns regarding medicines are outlined above.  Orders Placed This Encounter  Procedures   Basic Metabolic Panel (BMET)   EKG 12-Lead   Meds ordered this encounter  Medications   sacubitril-valsartan (ENTRESTO) 24-26 MG    Sig: Take 1 tablet by mouth 2 (two) times daily.    Dispense:  60 tablet    Refill:  11    Please Honor Card patient is presenting for Cecelia Byars: [id number]; Alvira Philips: [id number]; RXPCN: [id number]; RXID: [id number]   amLODipine (NORVASC) 5 MG tablet    Sig: Take 1 tablet (5 mg total) by mouth daily.    Dispense:  30 tablet    Refill:  11    Patient Instructions  Medication Instructions:   DISCONTINUE Lisinopril  START Entresto one (1) tablet by mouth ( 24-26 mg) twice daily on Saturday, 36 hours after discontinuing Lisinopril.  DECREASE Amlodipine one (1) tablet by mouth (5 mg) daily.  You can  use your (10 mg) tablet and break in half to use them up.   *If you need a refill on your cardiac medications  before your next appointment, please call your pharmacy*   Lab Work:  Your physician recommends that you return for lab work on Tuesday, October 3. You can come in on the day of your appointment anytime between 7:30-4:30.    If you have labs (blood work) drawn today and your tests are completely normal, you will receive your results only by: MyChart Message (if you have MyChart) OR A paper copy in the mail If you have any lab test that is abnormal or we need to change your treatment, we will call you to review the results.   Testing/Procedures:  None ordered.    Follow-Up: At [facility], you and your health needs are our priority.  As part of our continuing mission to provide you with exceptional heart care, we have created designated Provider Care Teams.  These Care Teams include your primary Cardiologist (physician) and Advanced Practice Providers (APPs -  Physician Assistants and Nurse Practitioners) who all work together to provide you with the care you need, when you need it.  We recommend signing up for the patient portal called "MyChart".  Sign up information is provided on this After Visit Summary.  MyChart is used to connect with patients for Virtual Visits (Telemedicine).  Patients are able to view lab/test results, encounter notes, upcoming appointments, etc.  Non-urgent messages can be sent to your provider as well.   To learn more about what you can do with MyChart, go to [email or web address].    Your next appointment:   4 month(s)  The format for your next appointment:   In Person  Provider:   Lance Muss, MD      Important Information About Sugar         Signed, Levi Aland, NP  01/21/2022 12:28 PM    [facility]

## 2022-01-21 ENCOUNTER — Ambulatory Visit: Payer: 59 | Attending: Nurse Practitioner | Admitting: Nurse Practitioner

## 2022-01-21 ENCOUNTER — Encounter: Payer: Self-pay | Admitting: Nurse Practitioner

## 2022-01-21 VITALS — BP 140/78 | HR 56 | Ht 63.0 in | Wt 182.0 lb

## 2022-01-21 DIAGNOSIS — Z720 Tobacco use: Secondary | ICD-10-CM

## 2022-01-21 DIAGNOSIS — R0602 Shortness of breath: Secondary | ICD-10-CM

## 2022-01-21 DIAGNOSIS — E785 Hyperlipidemia, unspecified: Secondary | ICD-10-CM

## 2022-01-21 DIAGNOSIS — I5042 Chronic combined systolic (congestive) and diastolic (congestive) heart failure: Secondary | ICD-10-CM | POA: Diagnosis not present

## 2022-01-21 DIAGNOSIS — I10 Essential (primary) hypertension: Secondary | ICD-10-CM

## 2022-01-21 DIAGNOSIS — I2510 Atherosclerotic heart disease of native coronary artery without angina pectoris: Secondary | ICD-10-CM | POA: Diagnosis not present

## 2022-01-21 DIAGNOSIS — I2102 ST elevation (STEMI) myocardial infarction involving left anterior descending coronary artery: Secondary | ICD-10-CM | POA: Diagnosis not present

## 2022-01-21 MED ORDER — AMLODIPINE BESYLATE 5 MG PO TABS: 5.0000 mg | ORAL_TABLET | Freq: Every day | ORAL | 11 refills | Status: DC

## 2022-01-21 MED ORDER — ENTRESTO 24-26 MG PO TABS: 1.0000 | ORAL_TABLET | Freq: Two times a day (BID) | ORAL | 11 refills | Status: AC

## 2022-01-21 NOTE — Patient Instructions (Addendum)
Medication Instructions:   DISCONTINUE Lisinopril  START Entresto one (1) tablet by mouth ( 24-26 mg) twice daily on Saturday, 36 hours after discontinuing Lisinopril.  DECREASE Amlodipine one (1) tablet by mouth (5 mg) daily.  You can use your (10 mg) tablet and break in half to use them up.   *If you need a refill on your cardiac medications before your next appointment, please call your pharmacy*   Lab Work:  Your physician recommends that you return for lab work on Tuesday, October 3. You can come in on the day of your appointment anytime between 7:30-4:30.    If you have labs (blood work) drawn today and your tests are completely normal, you will receive your results only by: [place] (if you have MyChart) OR A paper copy in the mail If you have any lab test that is abnormal or we need to change your treatment, we will call you to review the results.   Testing/Procedures:  None ordered.    Follow-Up: At [facility], you and your health needs are our priority.  As part of our continuing mission to provide you with exceptional heart care, we have created designated Provider Care Teams.  These Care Teams include your primary Cardiologist (physician) and Advanced Practice Providers (APPs -  Physician Assistants and Nurse Practitioners) who all work together to provide you with the care you need, when you need it.  We recommend signing up for the patient portal called "MyChart".  Sign up information is provided on this After Visit Summary.  MyChart is used to connect with patients for Virtual Visits (Telemedicine).  Patients are able to view lab/test results, encounter notes, upcoming appointments, etc.  Non-urgent messages can be sent to your provider as well.   To learn more about what you can do with MyChart, go to [email or web address].    Your next appointment:   4 month(s)  The format for your next appointment:   In Person  Provider:   Larae Grooms, MD      Important Information About Sugar

## 2022-01-22 ENCOUNTER — Encounter (HOSPITAL_COMMUNITY): Payer: Self-pay | Admitting: Interventional Cardiology

## 2022-01-26 ENCOUNTER — Encounter (HOSPITAL_COMMUNITY): Payer: Self-pay

## 2022-01-26 ENCOUNTER — Telehealth (HOSPITAL_COMMUNITY): Payer: 59 | Admitting: Psychiatry

## 2022-02-03 ENCOUNTER — Other Ambulatory Visit: Payer: 59

## 2022-02-06 ENCOUNTER — Other Ambulatory Visit (HOSPITAL_COMMUNITY): Payer: Self-pay

## 2022-02-11 ENCOUNTER — Ambulatory Visit: Payer: 59 | Attending: Nurse Practitioner

## 2022-02-11 DIAGNOSIS — I10 Essential (primary) hypertension: Secondary | ICD-10-CM

## 2022-02-11 DIAGNOSIS — E785 Hyperlipidemia, unspecified: Secondary | ICD-10-CM

## 2022-02-11 DIAGNOSIS — I2102 ST elevation (STEMI) myocardial infarction involving left anterior descending coronary artery: Secondary | ICD-10-CM | POA: Diagnosis not present

## 2022-02-11 DIAGNOSIS — I2510 Atherosclerotic heart disease of native coronary artery without angina pectoris: Secondary | ICD-10-CM

## 2022-02-11 LAB — BASIC METABOLIC PANEL
BUN/Creatinine Ratio: 6 — ABNORMAL LOW (ref 9–23)
BUN: 5 mg/dL — ABNORMAL LOW (ref 6–24)
CO2: 26 mmol/L (ref 20–29)
Calcium: 9.4 mg/dL (ref 8.7–10.2)
Chloride: 105 mmol/L (ref 96–106)
Creatinine, Ser: 0.82 mg/dL (ref 0.57–1.00)
Glucose: 95 mg/dL (ref 70–99)
Potassium: 3.6 mmol/L (ref 3.5–5.2)
Sodium: 145 mmol/L — ABNORMAL HIGH (ref 134–144)
eGFR: 85 mL/min/{1.73_m2} (ref >59)

## 2022-02-13 ENCOUNTER — Telehealth: Payer: Self-pay

## 2022-02-13 NOTE — Telephone Encounter (Signed)
Spoke with pt and advised of lab result as below.  Pt states she has stopped her Lisinopril and will increase her water intake.  She thanked Therapist, sports for the phone call.

## 2022-02-13 NOTE — Telephone Encounter (Signed)
-----   Message from Imogene Burn, PA-C sent at 02/12/2022  7:24 AM EDT ----- Covering for Florene Glen.Labs stable but appears a little dehydrated. Her  med lists shows both lisinopril and entresto, please verify she stopped lisinopril when entresto was started and remove from med list. Increase water intake.

## 2022-02-28 ENCOUNTER — Other Ambulatory Visit (HOSPITAL_COMMUNITY): Payer: Self-pay

## 2022-04-09 ENCOUNTER — Other Ambulatory Visit (HOSPITAL_COMMUNITY): Payer: Self-pay | Admitting: Psychiatry

## 2022-04-15 ENCOUNTER — Other Ambulatory Visit: Payer: Self-pay | Admitting: Cardiology

## 2022-04-16 ENCOUNTER — Telehealth (INDEPENDENT_AMBULATORY_CARE_PROVIDER_SITE_OTHER): Payer: 59 | Admitting: Psychiatry

## 2022-04-16 ENCOUNTER — Encounter (HOSPITAL_COMMUNITY): Payer: Self-pay | Admitting: Psychiatry

## 2022-04-16 DIAGNOSIS — F411 Generalized anxiety disorder: Secondary | ICD-10-CM

## 2022-04-16 DIAGNOSIS — F902 Attention-deficit hyperactivity disorder, combined type: Secondary | ICD-10-CM | POA: Diagnosis not present

## 2022-04-16 MED ORDER — BUSPIRONE HCL 10 MG PO TABS
10.0000 mg | ORAL_TABLET | Freq: Three times a day (TID) | ORAL | 2 refills | Status: DC
Start: 2022-04-16 — End: 2022-05-10

## 2022-04-16 MED ORDER — BUPROPION HCL ER (XL) 300 MG PO TB24: 300.0000 mg | ORAL_TABLET | ORAL | 1 refills | Status: DC

## 2022-04-16 MED ORDER — TRAZODONE HCL 50 MG PO TABS: ORAL_TABLET | ORAL | 1 refills | Status: DC

## 2022-04-16 MED ORDER — ALPRAZOLAM 1 MG PO TABS: 1.0000 mg | ORAL_TABLET | Freq: Three times a day (TID) | ORAL | 2 refills | Status: DC | PRN

## 2022-04-16 NOTE — Progress Notes (Signed)
Virtual Visit via Telephone Note  I connected with Jean Nelson on 04/16/22 at  9:20 AM EST by telephone and verified that I am speaking with the correct person using two identifiers.  Location: Patient: home Provider: office   I discussed the limitations, risks, security and privacy concerns of performing an evaluation and management service by telephone and the availability of in person appointments. I also discussed with the patient that there may be a patient responsible charge related to this service. The patient expressed understanding and agreed to proceed.      I discussed the assessment and treatment plan with the patient. The patient was provided an opportunity to ask questions and all were answered. The patient agreed with the plan and demonstrated an understanding of the instructions.   The patient was advised to call back or seek an in-person evaluation if the symptoms worsen or if the condition fails to improve as anticipated.  I provided 15 minutes of non-face-to-face time during this encounter.   [name], MD  [facility] MD/PA/NP OP Progress Note  04/16/2022 9:49 AM Jean Nelson  MRN:  [id number]  Chief Complaint:  Chief Complaint  Patient presents with   Depression   Anxiety   ADD   Follow-up   HPI: This patient is a 56 year old white female who lives with her 2 grandchildren in [place].  She is unemployed.  The patient returns for follow-up after 3 months regarding ADHD depression and anxiety.  Unfortunately since I last saw the patient she had a ST elevation MI in September.  She underwent balloon angioplasty.  She is now on new medications including Repatha, Entresto and Effient.  She has stopped the Adderall on advice of the cardiologist.  Her blood pressure still runs a bit high.  She is now very anxious and worried that she is going to have another MI.  This seems to work against her because the more anxious she gets she develops chest pain again  which causes more anxiety and she is going around in a circle.  I advised her to work on some relaxation techniques like using breathing.  I also stated that if there is any way she could do cardiac rehab they could teach her some techniques to help her relax and she is going to try to do this.  She is no longer on BuSpar for some reason I think this would help her anxiety along with the Xanax so she is we will restart it.  She denies significant depression.  She is trying her best to stop smoking and is down to about 6 to 10 cigarettes a day.  She just got the NicoDerm patch.  The Wellbutrin does help with this.  She is generally sleeping fairly well. Visit Diagnosis:    ICD-10-CM   1. ADHD (attention deficit hyperactivity disorder), combined type  F90.2     2. Generalized anxiety disorder  F41.1       Past Psychiatric History: none  Past Medical History:  Past Medical History:  Diagnosis Date   Anxiety    Chronic back pain    Essential hypertension    Heart attack (HCC)    Plantar fasciitis    Sciatica     Past Surgical History:  Procedure Laterality Date   APPENDECTOMY     CARDIAC CATHETERIZATION     CORONARY/GRAFT ACUTE MI REVASCULARIZATION N/A 01/08/2022   Procedure: Coronary/Graft Acute MI Revascularization;  Surgeon: Corky Crafts, MD;  [facility]: [facility] INVASIVE CV LAB;  Service: Cardiovascular;  Laterality: N/A;   LEFT HEART CATH AND CORONARY ANGIOGRAPHY N/A 01/08/2022   Procedure: LEFT HEART CATH AND CORONARY ANGIOGRAPHY;  Surgeon: Corky Crafts, MD;  Location: [facility] INVASIVE CV LAB;  Service: Cardiovascular;  Laterality: N/A;   TUBAL LIGATION      Family Psychiatric History: See below  Family History:  Family History  Problem Relation Age of Onset   ADD / ADHD Mother    Hypertension Mother    Alcohol abuse Maternal Uncle    Depression Maternal Grandfather    Alcohol abuse Maternal Grandfather     Social History:  Social History   Socioeconomic History    Marital status: Married    Spouse name: Not on file   Number of children: Not on file   Years of education: Not on file   Highest education level: Not on file  Occupational History   Not on file  Tobacco Use   Smoking status: Every Day    Packs/day: 0.50    Types: Cigarettes   Smokeless tobacco: Never  Vaping Use   Vaping Use: Never used  Substance and Sexual Activity   Alcohol use: Yes    Comment: occasionally    Drug use: No   Sexual activity: Yes    Birth control/protection: Surgical  Other Topics Concern   Not on file  Social History Narrative   Not on file   Social Determinants of Health   Financial Resource Strain: Not on file  Food Insecurity: Not on file  Transportation Needs: Not on file  Physical Activity: Not on file  Stress: Not on file  Social Connections: Not on file    Allergies:  Allergies  Allergen Reactions   Sulfa Antibiotics Swelling   Codeine Hives    Metabolic Disorder Labs: Lab Results  Component Value Date   HGBA1C 5.6 01/08/2022   MPG 114.02 01/08/2022   MPG 114.02 01/08/2022   No results found for: "PROLACTIN" Lab Results  Component Value Date   CHOL 115 01/08/2022   TRIG 159 (H) 01/08/2022   HDL 34 (L) 01/08/2022   CHOLHDL 3.4 01/08/2022   VLDL 32 01/08/2022   LDLCALC 49 01/08/2022   LDLCALC 34 01/08/2022   No results found for: "TSH"  Therapeutic Level Labs: No results found for: "LITHIUM" No results found for: "VALPROATE" No results found for: "CBMZ"  Current Medications: Current Outpatient Medications  Medication Sig Dispense Refill   albuterol (PROVENTIL HFA;VENTOLIN HFA) 108 (90 BASE) MCG/ACT inhaler Inhale 1-2 puffs into the lungs every 6 (six) hours as needed for wheezing (Use the inhaler for the next 4 days 4 times a day then begin using only if needed for wheezing). 1 Inhaler 0   ALPRAZolam (XANAX) 1 MG tablet Take 1 tablet (1 mg total) by mouth 3 (three) times daily as needed for anxiety. 90 tablet 2    amLODipine (NORVASC) 5 MG tablet Take 1 tablet (5 mg total) by mouth daily. 30 tablet 11   aspirin EC 81 MG tablet Take 81 mg by mouth daily.     buPROPion (WELLBUTRIN XL) 300 MG 24 hr tablet Take 1 tablet (300 mg total) by mouth every morning. 90 tablet 1   busPIRone (BUSPAR) 10 MG tablet Take 1 tablet (10 mg total) by mouth 3 (three) times daily. 90 tablet 2   cholecalciferol (VITAMIN D3) 25 MCG (1000 UNIT) tablet Take 1,000 Units by mouth daily.     metoprolol tartrate (LOPRESSOR) 25 MG tablet Take 25 mg by  mouth 2 (two) times daily.     nicotine (NICODERM CQ - DOSED IN MG/24 HR) 7 mg/24hr patch Place 1 patch (7 mg total) onto the skin daily. 28 patch 0   nitroGLYCERIN (NITROSTAT) 0.4 MG SL tablet Place 1 tablet (0.4 mg total) under the tongue every 5 (five) minutes x 3 doses as needed for chest pain. 25 tablet 2   prasugrel (EFFIENT) 10 MG TABS tablet Take 1 tablet (10 mg total) by mouth daily. 90 tablet 2   rosuvastatin (CRESTOR) 40 MG tablet TAKE 1 TABLET BY MOUTH DAILY 90 tablet 2   sacubitril-valsartan (ENTRESTO) 24-26 MG Take 1 tablet by mouth 2 (two) times daily. 60 tablet 11   Study - EVOLVE-MI - evolocumab (REPATHA) 140 mg/mL SQ injection (PI-Stuckey) Inject 1 mL (140 mg total) into the skin every 14 (fourteen) days. For Investigational Use Only. Inject subcutaneously into abdomen, thigh, or upper arm every 14 days. Rotate injection sites and do not inject into areas where skin is tender, bruised, or red. Please contact [facility] regarding any questions or concerns regarding this medication. 12 mL 0   traZODone (DESYREL) 50 MG tablet TAKE 1 TABLET BY MOUTH EVERYDAY AT BEDTIME 90 tablet 1   vitamin B-12 (CYANOCOBALAMIN) 500 MCG tablet Take 500 mcg by mouth daily.     No current facility-administered medications for this visit.     Musculoskeletal: Strength & Muscle Tone: na Gait & Station: na Patient leans: N/A  Psychiatric Specialty Exam: Review of Systems   Cardiovascular:  Positive for chest pain.  Psychiatric/Behavioral:  The patient is nervous/anxious.   All other systems reviewed and are negative.   Last menstrual period 05/25/2016.There is no height or weight on file to calculate BMI.  General Appearance: NA  Eye Contact:  NA  Speech:  Clear and Coherent  Volume:  Normal  Mood:  Euthymic but anxious  Affect:  NA  Thought Process:  Goal Directed  Orientation:  Full (Time, Place, and Person)  Thought Content: Rumination   Suicidal Thoughts:  No  Homicidal Thoughts:  No  Memory:  Immediate;   Good Recent;   Good Remote;   Fair  Judgement:  Good  Insight:  Good  Psychomotor Activity:  Normal  Concentration:  Concentration: Poor and Attention Span: Poor  Recall:  Good  Fund of Knowledge: Good  Language: Good  Akathisia:  No  Handed:  Right  AIMS (if indicated): not done  Assets:  Communication Skills Desire for Improvement Resilience Social Support  ADL's:  Intact  Cognition: WNL  Sleep:  Good   Screenings: PHQ2-9    Flowsheet Row [facility] Video Visit from 10/06/2021 in [facility] Video Visit from 07/02/2021 in [facility] Video Visit from 12[facility]31/2020 in [facility]  PHQ-2 Total Score 0 1 0 1      Flowsheet Row ED to Hosp-Admission (Discharged) from 01/08/2022 in [place] [facility] Video Visit from [facility]/06/2022 [facility] Video Visit from 10/06/2021 [facility]  C-SSRS RISK CATEGORY No Risk No Risk No Risk        Assessment and Plan: This patient is a 56 year old female with a history of ADHD depression anxiety.  She has now suffered her second MI and this 1 seemed more serious.  She is on several medications for heart  failure elevated cholesterol and a blood  thinner.  We have to be really careful to not increase her heart rate or blood pressure side rather she stay off stimulants for now as would cardiology.  It is imperative that she quit smoking.  The anxiety is only going to make her symptoms worse so we will restart BuSpar 10 mg 3 times daily in addition to the Xanax 1 mg 3 times daily for anxiety.  She will continue Wellbutrin XL 300 mg daily for depression and trazodone 50 mg at bedtime as needed for sleep.  She will return to see me in 2 months  Collaboration of Care: Collaboration of Care: Other provider involved in patient's care AEB notes are shared with cardiology on the epic system  Patient/Guardian was advised Release of Information must be obtained prior to any record release in order to collaborate their care with an outside provider. Patient/Guardian was advised if they have not already done so to contact the registration department to sign all necessary forms in order for us to release information regarding their care.   Consent: Patient/Guardian gives verbal consent for treatment and assignment of benefits for services provided during this visit. Patient/Guardian expressed understanding and agreed to proceed.    Diannia Rudereborah Mahlia Fernando, MD 04/16/2022, 9:49 AM

## 2022-05-06 NOTE — Research (Signed)
EVOLVE Informed Consent   Subject Name: Jean Nelson  Subject met inclusion and exclusion criteria.  The informed consent form, study requirements and expectations were reviewed with the subject and questions and concerns were addressed prior to the signing of the consent form.  The subject verbalized understanding of the trial requirements.  The subject agreed to participate in the EVOLVE trial and signed the informed consent at 13:30 on 01-09-2022.  The informed consent was obtained prior to performance of any protocol-specific procedures for the subject. This patient was consented by Berneda Rose.  A copy of the signed informed consent was given to the subject and a copy was placed in the subject's medical record.   [place] Nikko Goldwire      Protocol # [id number]   Subject ID#   [id number]                            DAY 1 Date:       09-Jan-2022  Randomization:   Geographic Region:    [place]  Randomization Date:     09-Jan-2022  Randomization Time:  13:38     Treatment type Assigned   _0  Treatment                                   _1   Control          Protocol # [id number]  Subject Initial ID#:   [id number]                               Age in years: 52  *Demographics are found in the [place] EMR source.   Protocol Version: v2.00 [id number]  Were all Eligibility Criteria Met?  _2  Yes  _3   No  Screening Visit Date:                09-Jan-2022      Protocol # [id number]   Subject ID#  [id number]                       DAY 1 Date:    09-Jan-2022     Initial Study Treatment- Evolocumab Self-administration  Training for Evolocumab Self-Administration Completed?  _4  YES  _5   NO  Training for Evolocumab Self-Administration Date:  09-Jan-2022  Evolocumab Administered?  _6  YES  _7   NO  Evolocumab Start Date: 09-Jan-2022  Lot Number of Initial Dose Administered: [id number]  Additional Lot Number Distributed to Subject: - [id number]  Additional Lot Number Distributed to  Subject: - [id number]  Additional Lot Number Distributed to Subject: - [id number]  Additional Lot Number Distributed to Subject: [id number]  Additional Lot Number Distributed to Subject: - [id number]

## 2022-05-07 ENCOUNTER — Telehealth: Payer: Self-pay | Admitting: *Deleted

## 2022-05-07 ENCOUNTER — Telehealth (HOSPITAL_COMMUNITY): Payer: Self-pay | Admitting: *Deleted

## 2022-05-07 NOTE — Telephone Encounter (Signed)
Patient called stating she abruptly stopped her medication (without provider knowing) that provider was trying to taper her off of. Per pt she is not feeling like herself. Per pt she is feels like her symptoms are getting worse. Per would like to know if provider could put her either on 10 mg again or something.   Per pt the medication was the Adderall. Per pt she would love to cut it in half instead of taking the 20 mg like before.   Patient number is [phone].

## 2022-05-07 NOTE — Telephone Encounter (Signed)
Called patient for 12 week phone call for EVOLVE study left message for patient to call me back    Jean Nelson, Research Coordinator 05/07/2021 10:42 a.m.

## 2022-05-07 NOTE — Telephone Encounter (Signed)
Dr. Harrington Challenger recommended to stop the Adderall.  It is also recommendation from the cardiologist because of high blood pressure.  She should avoid taking stimulants.  We need to get a clearance from the cardiology to consider a stimulant.  She can also wait for Dr. Harrington Challenger when she returned to work next week.

## 2022-05-07 NOTE — Telephone Encounter (Signed)
Spoke with patient and informed her with what provider stated and she agreed and verbalized understanding. Per pt she is going to her Cardiologist this week.

## 2022-05-08 ENCOUNTER — Other Ambulatory Visit (HOSPITAL_COMMUNITY): Payer: Self-pay | Admitting: Psychiatry

## 2022-05-15 ENCOUNTER — Encounter: Payer: Self-pay | Admitting: *Deleted

## 2022-05-15 DIAGNOSIS — Z006 Encounter for examination for normal comparison and control in clinical research program: Secondary | ICD-10-CM

## 2022-05-15 NOTE — Research (Signed)
Follow-Up Visit Completed*   []  Not Necessary, No Potential Adverse Events Or Medication Issues Reported On Completed Subject Questionnaire   [x]  Yes, Contact With Subject/Alternate Contact Completed   []  Yes, No Contact With Subject/Alternate Contact Completed, But Electronic Health Record Was Reviewed   []  No, Unable To Contact Subject/Alternate Contact   Have you reviewed Ongoing medications on the Targeted Concomitant Medication form and updated the form as needed?   [x]  Yes   []  No   Subject Status*   [x]  Continuing In Follow-up   []  At Risk For Lost To Follow-up   []  Withdrawal From All Future Study Activities Including Passive Follow-up By [place] Record Review Or Contact With Healthcare Provider Or Family Member/Friend   []  Death   Vital Status*   [x]  Alive   []  Deceased   []  Unknown   Last Known To Be Alive Source*   [x]  Subject Completed Follow-up Questionnaire/Seen In Person/Via Telephone Contact   []  Family Member or Caretaker   []  Primary Physician Or Medical Records   []  Publicly Available Source   []  Other  Date of last dose taken   15-May-2021  Over the last 12 weeks did the subject miss any doses? No  Over the last 12 weeks did the subject restart Evolocumab after an interruption? No

## 2022-05-21 ENCOUNTER — Other Ambulatory Visit (HOSPITAL_COMMUNITY): Payer: Self-pay | Admitting: Psychiatry

## 2022-05-21 DIAGNOSIS — R053 Chronic cough: Secondary | ICD-10-CM | POA: Diagnosis not present

## 2022-05-21 DIAGNOSIS — Z720 Tobacco use: Secondary | ICD-10-CM | POA: Diagnosis not present

## 2022-05-23 NOTE — Progress Notes (Deleted)
Cardiology Office Note   Date:  05/23/2022   ID:  Jean Nelson, DOB 1965/12/16, MRN [id number]  PCP:  Teodoro Kil, PA-C    No chief complaint on file.  CAD  Wt Readings from Last 3 Encounters:  01/21/22 182 lb (82.6 kg)  01/09/22 175 lb 6.4 oz (79.6 kg)  06/24/20 170 lb (77.1 kg)       History of Present Illness: Jean Nelson is a 57 y.o. female  with a hx of CAD s/p STEMI, prior acute MI 2 years prior to 2023 in [place] with what sounds like a CTO of RCA with no intervention given collaterals, hypertension, hyperlipidemia, tobacco abuse,   She presented 01/08/22 with chest pain that started at 1 AM that day. Was seen at outside ED and transported to [facility] While en route, EKG with minimal ST elevations in V5-V6, persistent Q waves in inferior leads. Taken emergently to cath lab with evidence of obstructive disease in distal LAD with left-to-right collaterals, evidence of obstructive disease in mid to distal circumflex, CTO of proximal RCA, rapid reconstitution of flow from collaterals, elevated EDP of 17.  Decision was made to proceed with LAD intervention with PTCA with resolution of chest pain at the end of procedure. Plan to continue DAPT indefinitely. Brilinta not covered by insurance, so she is on Effient and aspirin. Mildly reduced LVEF on echo-%.     In 9/23, reported some sharp stabbing chest pains.  Had some anxiety about resuming activities.    Past Medical History:  Diagnosis Date   Anxiety    Chronic back pain    Essential hypertension    Heart attack ([place])    Plantar fasciitis    Sciatica     Past Surgical History:  Procedure Laterality Date   APPENDECTOMY     CARDIAC CATHETERIZATION     CORONARY/GRAFT ACUTE MI REVASCULARIZATION N/A 01/08/2022   Procedure: Coronary/Graft Acute MI Revascularization;  Surgeon: Jettie Booze, MD;  Location: [place] CV LAB;  Service: Cardiovascular;  Laterality: N/A;   LEFT HEART CATH AND CORONARY ANGIOGRAPHY N/A  01/08/2022   Procedure: LEFT HEART CATH AND CORONARY ANGIOGRAPHY;  Surgeon: Jettie Booze, MD;  Location: [place] CV LAB;  Service: Cardiovascular;  Laterality: N/A;   TUBAL LIGATION       Current Outpatient Medications  Medication Sig Dispense Refill   albuterol (PROVENTIL HFA;VENTOLIN HFA) 108 (90 BASE) MCG/ACT inhaler Inhale 1-2 puffs into the lungs every 6 (six) hours as needed for wheezing (Use the inhaler for the next 4 days 4 times a day then begin using only if needed for wheezing). 1 Inhaler 0   ALPRAZolam (XANAX) 1 MG tablet Take 1 tablet (1 mg total) by mouth 3 (three) times daily as needed for anxiety. 90 tablet 2   amLODipine (NORVASC) 5 MG tablet Take 1 tablet (5 mg total) by mouth daily. 30 tablet 11   aspirin EC 81 MG tablet Take 81 mg by mouth daily.     buPROPion (WELLBUTRIN XL) 300 MG 24 hr tablet Take 1 tablet (300 mg total) by mouth every morning. 90 tablet 1   busPIRone (BUSPAR) 10 MG tablet TAKE 1 TABLET BY MOUTH THREE TIMES A DAY 270 tablet 1   cholecalciferol (VITAMIN D3) 25 MCG (1000 UNIT) tablet Take 1,000 Units by mouth daily.     metoprolol tartrate (LOPRESSOR) 25 MG tablet Take 25 mg by mouth 2 (two) times daily.     nicotine (NICODERM CQ -  DOSED IN MG/24 HR) 7 mg/24hr patch Place 1 patch (7 mg total) onto the skin daily. 28 patch 0   nitroGLYCERIN (NITROSTAT) 0.4 MG SL tablet Place 1 tablet (0.4 mg total) under the tongue every 5 (five) minutes x 3 doses as needed for chest pain. 25 tablet 2   prasugrel (EFFIENT) 10 MG TABS tablet Take 1 tablet (10 mg total) by mouth daily. 90 tablet 2   rosuvastatin (CRESTOR) 40 MG tablet TAKE 1 TABLET BY MOUTH DAILY 90 tablet 2   sacubitril-valsartan (ENTRESTO) 24-26 MG Take 1 tablet by mouth 2 (two) times daily. 60 tablet 11   Study - EVOLVE-MI - evolocumab (REPATHA) 140 mg/mL SQ injection (PI-Stuckey) Inject 1 mL (140 mg total) into the skin every 14 (fourteen) days. For Investigational Use Only. Inject subcutaneously  into abdomen, thigh, or upper arm every 14 days. Rotate injection sites and do not inject into areas where skin is tender, bruised, or red. Please contact [facility] regarding any questions or concerns regarding this medication. 12 mL 0   traZODone (DESYREL) 50 MG tablet TAKE 1 TABLET BY MOUTH EVERYDAY AT BEDTIME 90 tablet 1   vitamin B-12 (CYANOCOBALAMIN) 500 MCG tablet Take 500 mcg by mouth daily.     No current facility-administered medications for this visit.    Allergies:   Sulfa antibiotics and Codeine    Social History:  The patient  reports that she has been smoking cigarettes. She has been smoking an average of .5 packs per day. She has never used smokeless tobacco. She reports current alcohol use. She reports that she does not use drugs.   Family History:  The patient's ***family history includes ADD / ADHD in her mother; Alcohol abuse in her maternal grandfather and maternal uncle; Depression in her maternal grandfather; Hypertension in her mother.    ROS:  Please see the history of present illness.   Otherwise, review of systems are positive for ***.   All other systems are reviewed and negative.    PHYSICAL EXAM: VS:  LMP 05/25/2016  , BMI There is no height or weight on file to calculate BMI. GEN: Well nourished, well developed, in no acute distress HEENT: normal Neck: no JVD, carotid bruits, or masses Cardiac: ***RRR; no murmurs, rubs, or gallops,no edema  Respiratory:  clear to auscultation bilaterally, normal work of breathing GI: soft, nontender, nondistended, + BS MS: no deformity or atrophy Skin: warm and dry, no rash Neuro:  Strength and sensation are intact Psych: euthymic mood, full affect   EKG:   The ekg ordered today demonstrates ***   Recent Labs: 01/08/2022: ALT 17 01/09/2022: Hemoglobin 14.6; Magnesium 2.1; Platelets 262 02/11/2022: BUN 5; Creatinine, Ser 0.82; Potassium 3.6; Sodium 145   Lipid Panel    Component Value Date/Time    CHOL 115 01/08/2022 0911   TRIG 159 (H) 01/08/2022 0911   HDL 34 (L) 01/08/2022 0911   CHOLHDL 3.4 01/08/2022 0911   VLDL 32 01/08/2022 0911   LDLCALC 49 01/08/2022 0911     Other studies Reviewed: Additional studies/ records that were reviewed today with results demonstrating: ***.   ASSESSMENT AND PLAN:  CAD/old MI:  Hyperlipidemia: Combined chronic systolic/diastolic heart failure: Hypertension: Tobacco abuse:   Current medicines are reviewed at length with the patient today.  The patient concerns regarding her medicines were addressed.  The following changes have been made:  No change***  Labs/ tests ordered today include: *** No orders of the defined types were placed in  this encounter.   Recommend 150 minutes/week of aerobic exercise Low fat, low carb, high fiber diet recommended  Disposition:   FU in ***   Signed, Larae Grooms, MD  05/23/2022 11:05 PM    [facility] [address], [place], [place]  [address] Phone: [phone]; Fax: [phone]

## 2022-05-25 ENCOUNTER — Ambulatory Visit: Payer: 59 | Admitting: Interventional Cardiology

## 2022-06-11 ENCOUNTER — Encounter (HOSPITAL_COMMUNITY): Payer: Self-pay

## 2022-06-15 NOTE — Telephone Encounter (Signed)
Insurance scanned in chart

## 2022-06-17 ENCOUNTER — Ambulatory Visit: Payer: 59 | Admitting: Physician Assistant

## 2022-06-17 ENCOUNTER — Telehealth (INDEPENDENT_AMBULATORY_CARE_PROVIDER_SITE_OTHER): Payer: 59 | Admitting: Psychiatry

## 2022-06-17 ENCOUNTER — Encounter (HOSPITAL_COMMUNITY): Payer: Self-pay | Admitting: Psychiatry

## 2022-06-17 DIAGNOSIS — F411 Generalized anxiety disorder: Secondary | ICD-10-CM

## 2022-06-17 DIAGNOSIS — F902 Attention-deficit hyperactivity disorder, combined type: Secondary | ICD-10-CM

## 2022-06-17 MED ORDER — TRAZODONE HCL 100 MG PO TABS: 100.0000 mg | ORAL_TABLET | Freq: Every day | ORAL | 2 refills | Status: DC

## 2022-06-17 MED ORDER — BUSPIRONE HCL 10 MG PO TABS: 10.0000 mg | ORAL_TABLET | Freq: Three times a day (TID) | ORAL | 1 refills | Status: DC

## 2022-06-17 MED ORDER — ALPRAZOLAM 1 MG PO TABS: 1.0000 mg | ORAL_TABLET | Freq: Three times a day (TID) | ORAL | 2 refills | Status: DC | PRN

## 2022-06-17 MED ORDER — BUPROPION HCL ER (XL) 300 MG PO TB24: 300.0000 mg | ORAL_TABLET | ORAL | 2 refills | Status: DC

## 2022-06-17 NOTE — Progress Notes (Signed)
Virtual Visit via Telephone Note  I connected with Jean Nelson on 06/17/22 at 11:00 AM EST by telephone and verified that I am speaking with the correct person using two identifiers.  Location: Patient: home Provider: office   I discussed the limitations, risks, security and privacy concerns of performing an evaluation and management service by telephone and the availability of in person appointments. I also discussed with the patient that there may be a patient responsible charge related to this service. The patient expressed understanding and agreed to proceed.       I discussed the assessment and treatment plan with the patient. The patient was provided an opportunity to ask questions and all were answered. The patient agreed with the plan and demonstrated an understanding of the instructions.   The patient was advised to call back or seek an in-person evaluation if the symptoms worsen or if the condition fails to improve as anticipated.  I provided 20 minutes of non-face-to-face time during this encounter.   Levonne Spiller, MD  [facility] MD/PA/NP OP Progress Note  06/17/2022 11:22 AM Jean Nelson  MRN:  [id number]  Chief Complaint:  Chief Complaint  Patient presents with   Depression   Anxiety   ADD   Follow-up   HPI: This patient is a 57 year old white female who lives with her 2 grandchildren in [place].  She is unemployed.  The patient returns for follow-up after 2 months regarding depression and anxiety.  She also has a history of ADHD.  As noted last time she had had balloon angioplasty back last September.  She is on a lot of new cardiac medications.  She has stopped Adderall on the advice of her cardiologist and is "too scared" to go back on it.  She is cut down her cigarettes to 10 a day but cannot seem to get past that.  She is about to see a pulmonologist.  She states that she still stays very anxious.  She still worries a lot about her heart or whether or not  she will have another heart attack.  She hopefully will be able to start cardiac rehab soon which may help.  She is not sleeping well on the current dosage of trazodone and I think we need to increase this.  She denies significant depression or thoughts of self-harm. Visit Diagnosis:    ICD-10-CM   1. Generalized anxiety disorder  F41.1     2. ADHD (attention deficit hyperactivity disorder), combined type  F90.2       Past Psychiatric History: none  Past Medical History:  Past Medical History:  Diagnosis Date   Anxiety    Chronic back pain    Essential hypertension    Heart attack ([place])    Plantar fasciitis    Sciatica     Past Surgical History:  Procedure Laterality Date   APPENDECTOMY     CARDIAC CATHETERIZATION     CORONARY/GRAFT ACUTE MI REVASCULARIZATION N/A 01/08/2022   Procedure: Coronary/Graft Acute MI Revascularization;  Surgeon: Jettie Booze, MD;  Location: [place] [facility];  Service: Cardiovascular;  Laterality: N/A;   LEFT HEART CATH AND CORONARY ANGIOGRAPHY N/A 01/08/2022   Procedure: LEFT HEART CATH AND CORONARY ANGIOGRAPHY;  Surgeon: Jettie Booze, MD;  Location: [place] [facility];  Service: Cardiovascular;  Laterality: N/A;   TUBAL LIGATION      Family Psychiatric History: see below  Family History:  Family History  Problem Relation Age of Onset   ADD /  ADHD Mother    Hypertension Mother    Alcohol abuse Maternal Uncle    Depression Maternal Grandfather    Alcohol abuse Maternal Grandfather     Social History:  Social History   Socioeconomic History   Marital status: Married    Spouse name: Not on file   Number of children: Not on file   Years of education: Not on file   Highest education level: Not on file  Occupational History   Not on file  Tobacco Use   Smoking status: Every Day    Packs/day: 0.50    Types: Cigarettes   Smokeless tobacco: Never  Vaping Use   Vaping Use: Never used  Substance and Sexual Activity   Alcohol  use: Yes    Comment: occasionally    Drug use: No   Sexual activity: Yes    Birth control/protection: Surgical  Other Topics Concern   Not on file  Social History Narrative   Not on file   Social Determinants of Health   Financial Resource Strain: Not on file  Food Insecurity: Not on file  Transportation Needs: Not on file  Physical Activity: Not on file  Stress: Not on file  Social Connections: Not on file    Allergies:  Allergies  Allergen Reactions   Sulfa Antibiotics Swelling   Codeine Hives    Metabolic Disorder Labs: Lab Results  Component Value Date   HGBA1C 5.6 01/08/2022   MPG 114.02 01/08/2022   MPG 114.02 01/08/2022   No results found for: "PROLACTIN" Lab Results  Component Value Date   CHOL 115 01/08/2022   TRIG 159 (H) 01/08/2022   HDL 34 (L) 01/08/2022   CHOLHDL 3.4 01/08/2022   VLDL 32 01/08/2022   LDLCALC 49 01/08/2022   LDLCALC 34 01/08/2022   No results found for: "TSH"  Therapeutic Level Labs: No results found for: "LITHIUM" No results found for: "VALPROATE" No results found for: "CBMZ"  Current Medications: Current Outpatient Medications  Medication Sig Dispense Refill   traZODone (DESYREL) 100 MG tablet Take 1 tablet (100 mg total) by mouth at bedtime. 30 tablet 2   albuterol (PROVENTIL HFA;VENTOLIN HFA) 108 (90 BASE) MCG/ACT inhaler Inhale 1-2 puffs into the lungs every 6 (six) hours as needed for wheezing (Use the inhaler for the next 4 days 4 times a day then begin using only if needed for wheezing). 1 Inhaler 0   ALPRAZolam (XANAX) 1 MG tablet Take 1 tablet (1 mg total) by mouth 3 (three) times daily as needed for anxiety. 90 tablet 2   amLODipine (NORVASC) 5 MG tablet Take 1 tablet (5 mg total) by mouth daily. 30 tablet 11   aspirin EC 81 MG tablet Take 81 mg by mouth daily.     buPROPion (WELLBUTRIN XL) 300 MG 24 hr tablet Take 1 tablet (300 mg total) by mouth every morning. 90 tablet 2   busPIRone (BUSPAR) 10 MG tablet Take 1  tablet (10 mg total) by mouth 3 (three) times daily. 270 tablet 1   cholecalciferol (VITAMIN D3) 25 MCG (1000 UNIT) tablet Take 1,000 Units by mouth daily.     metoprolol tartrate (LOPRESSOR) 25 MG tablet Take 25 mg by mouth 2 (two) times daily.     nicotine (NICODERM CQ - DOSED IN MG/24 HR) 7 mg/24hr patch Place 1 patch (7 mg total) onto the skin daily. 28 patch 0   nitroGLYCERIN (NITROSTAT) 0.4 MG SL tablet Place 1 tablet (0.4 mg total) under the tongue every 5 (  five) minutes x 3 doses as needed for chest pain. 25 tablet 2   prasugrel (EFFIENT) 10 MG TABS tablet Take 1 tablet (10 mg total) by mouth daily. 90 tablet 2   rosuvastatin (CRESTOR) 40 MG tablet TAKE 1 TABLET BY MOUTH DAILY 90 tablet 2   sacubitril-valsartan (ENTRESTO) 24-26 MG Take 1 tablet by mouth 2 (two) times daily. 60 tablet 11   Study - EVOLVE-MI - evolocumab (REPATHA) 140 mg/mL SQ injection (PI-Stuckey) Inject 1 mL (140 mg total) into the skin every 14 (fourteen) days. For Investigational Use Only. Inject subcutaneously into abdomen, thigh, or upper arm every 14 days. Rotate injection sites and do not inject into areas where skin is tender, bruised, or red. Please contact [facility] regarding any questions or concerns regarding this medication. 12 mL 0   vitamin B-12 (CYANOCOBALAMIN) 500 MCG tablet Take 500 mcg by mouth daily.     No current facility-administered medications for this visit.     Musculoskeletal: Strength & Muscle Tone: na Gait & Station: na Patient leans: N/A  Psychiatric Specialty Exam: Review of Systems  Respiratory:  Positive for cough.   Psychiatric/Behavioral:  Positive for sleep disturbance. The patient is nervous/anxious.   All other systems reviewed and are negative.   Last menstrual period 05/25/2016.There is no height or weight on file to calculate BMI.  General Appearance: NA  Eye Contact:  NA  Speech:  Clear and Coherent  Volume:  Normal  Mood:  Euthymic bur anxious   Affect:  NA  Thought Process:  Goal Directed  Orientation:  Full (Time, Place, and Person)  Thought Content: Rumination   Suicidal Thoughts:  No  Homicidal Thoughts:  No  Memory:  Immediate;   Good Recent;   Good Remote;   Good  Judgement:  Good  Insight:  Fair  Psychomotor Activity:  Decreased  Concentration:  Concentration: Fair and Attention Span: Fair  Recall:  Good  Fund of Knowledge: Good  Language: Good  Akathisia:  No  Handed:  Right  AIMS (if indicated): not done  Assets:  Communication Skills Desire for Improvement Resilience Social Support  ADL's:  Intact  Cognition: WNL  Sleep:  Poor   Screenings: PHQ2-9    Flowsheet Row Video Visit from 01/06/2022 in [place] at [place] Video Visit from 10/06/2021 in [place] at [place] Video Visit from 07/02/2021 in [place] at [place] Video Visit from 12/31/2020 in [place] [facility] Total Score 0 1 0 1      Flowsheet Row ED to Hosp-Admission (Discharged) from 01/08/2022 in [place] ICU Video Visit from 01/06/2022 in [place] at [place] Video Visit from 10/06/2021 in [place] at [place] No Risk No Risk No Risk        Assessment and Plan: This patient is a 57 year old female with a history of ADHD depression and anxiety.  She is still quite anxious particular about her heart.  She is not sleeping well and this can make the anxiety worse.  Will increase trazodone to 100 mg at bedtime for sleep.  She will continue BuSpar 10 mg 3 times daily in addition to Xanax 1 mg 3 times daily for anxiety.  She will continue Wellbutrin XL 300 mg daily for depression.  She was again urged to stop smoking.  She will return to see me in  3 months  Collaboration of Care: Collaboration of Care: Other  provider involved in patient's care AEB notes are shared with cardiology through the epic system  Patient/Guardian was advised Release of Information must be obtained prior to any record release in order to collaborate their care with an outside provider. Patient/Guardian was advised if they have not already done so to contact the registration department to sign all necessary forms in order for [place] to release information regarding their care.   Consent: Patient/Guardian gives verbal consent for treatment and assignment of benefits for services provided during this visit. Patient/Guardian expressed understanding and agreed to proceed.    Levonne Spiller, MD 06/17/2022, 11:22 AM

## 2022-06-19 ENCOUNTER — Encounter: Payer: Self-pay | Admitting: *Deleted

## 2022-06-19 DIAGNOSIS — Z006 Encounter for examination for normal comparison and control in clinical research program: Secondary | ICD-10-CM

## 2022-06-19 MED ORDER — REPATHA SURECLICK 140 MG/ML ~~LOC~~ SOAJ: 140.0000 mg | SUBCUTANEOUS | 3 refills | Status: AC

## 2022-06-19 NOTE — Research (Signed)
ORDERS ONLY

## 2022-06-29 ENCOUNTER — Encounter: Payer: Self-pay | Admitting: Internal Medicine

## 2022-06-29 ENCOUNTER — Ambulatory Visit (INDEPENDENT_AMBULATORY_CARE_PROVIDER_SITE_OTHER): Payer: 59 | Admitting: Internal Medicine

## 2022-06-29 ENCOUNTER — Ambulatory Visit (HOSPITAL_COMMUNITY)
Admission: RE | Admit: 2022-06-29 | Discharge: 2022-06-29 | Disposition: A | Discharge status: Home / Self Care | Payer: 59 | Source: Ambulatory Visit | Attending: Internal Medicine | Admitting: Internal Medicine

## 2022-06-29 VITALS — BP 136/84 | HR 74 | Ht 63.0 in | Wt 195.2 lb

## 2022-06-29 DIAGNOSIS — R69 Illness, unspecified: Secondary | ICD-10-CM | POA: Diagnosis not present

## 2022-06-29 DIAGNOSIS — R0609 Other forms of dyspnea: Secondary | ICD-10-CM | POA: Diagnosis not present

## 2022-06-29 DIAGNOSIS — R079 Chest pain, unspecified: Secondary | ICD-10-CM | POA: Diagnosis not present

## 2022-06-29 DIAGNOSIS — R0600 Dyspnea, unspecified: Secondary | ICD-10-CM | POA: Diagnosis not present

## 2022-06-29 DIAGNOSIS — J4489 Other specified chronic obstructive pulmonary disease: Secondary | ICD-10-CM

## 2022-06-29 DIAGNOSIS — F17210 Nicotine dependence, cigarettes, uncomplicated: Secondary | ICD-10-CM

## 2022-06-29 MED ORDER — BREZTRI AEROSPHERE 160-9-4.8 MCG/ACT IN AERO: 2.0000 | INHALATION_SPRAY | Freq: Two times a day (BID) | RESPIRATORY_TRACT | 11 refills | Status: AC

## 2022-06-29 NOTE — Patient Instructions (Addendum)
Breztri Take 2 puffs first thing in am and then another 2 puffs about 12 hours later - fill the prescription if you feel it's helping your breathing    Work on inhaler technique:  relax and gently blow all the way out then take a nice smooth full deep breath back in, triggering the inhaler at same time you start breathing in.  Hold breath in for at least  5 seconds if you can. Blow out breztri  thru nose. Rinse and gargle with water when done.  If mouth or throat bother you at all,  try brushing teeth/gums/tongue with arm and hammer toothpaste/ make a slurry and gargle and spit out.   Your chest pains may just be related to gas in your abdomen or excess acid in your stomach  Treatment consists of avoiding foods that cause gas (especially boiled eggs, mexcican food but especially  beans and undercooked vegetables like  spinach and some salads)  and citrucel 1 tbsp twice daily with a large glass of water.  Pain should improve w/in 2 weeks if that's what is it.  Treatment for acid = Pantoprazole (protonix) 40 mg   Take  30-60 min before first meal of the day and Pepcid (famotidine)  20 mg after supper until return to office - this is the best way to tell whether stomach acid is contributing to your problem.     Please remember to go to the lab department   for your tests - we will call you with the results when they are available.      Please remember to go to the  x-ray department  @  [facility] for your tests - we will call you with the results when they are available        Please schedule a follow up office visit in 4 weeks, sooner if needed  with all medications /inhalers/ solutions in hand so we can verify exactly what you are taking. This includes all medications from all doctors and over the counters

## 2022-06-29 NOTE — Assessment & Plan Note (Addendum)
Active smoker - 06/29/2022  After extensive coaching inhaler device,  effectiveness =    60% hfa > try breztri 2bid  - Labs ordered 06/29/2022  :  allergy screen  alpha one AT phenotype    Likely group E copd > laba/lama / ics indicated > trial of breztri 2 bid

## 2022-06-29 NOTE — Assessment & Plan Note (Signed)
Counseled re importance of smoking cessation but did not meet time criteria for separate billing     Each maintenance medication was reviewed in detail including emphasizing most importantly the difference between maintenance and prns and under what circumstances the prns are to be triggered using an action plan format where appropriate.  Total time for H and P, chart review, counseling, reviewing hfa device(s) , directly observing portions of ambulatory 02 saturation study/ and generating customized AVS unique to this office visit / same day charting  > 45 min new pt eval with unexplained resp symptoms

## 2022-06-29 NOTE — Progress Notes (Addendum)
ALICEIA LAMONICA, female    DOB: 1965-05-16    MRN: [id number]   Brief patient profile:  50  yowf  active smoker  referred to pulmonary clinic in [place]  06/29/2022 by Teodoro Kil for sob and atypical      History of Present Illness  06/29/2022  Pulmonary/ 1st office eval/ Collie Wernick / [facility] / maint on [employer] Complaint  Patient presents with   Consult    Cough, SOB. Feels SOB laying down at night   Dyspnea:  slow pace walking flat surface  does ok / has trouble raking since since MI  Cough: productive daytime s typical am flare, stays grey all day then dry at hs  Sleep: electric 45 x 4 months, chest gets uncomfortable flatter since then   SABA use: none  02: none   No obvious day to day or daytime pattern/variability or assoc excess/ purulent sputum or mucus plugs or hemoptysis or cp or chest tightness, subjective wheeze or overt sinus or hb symptoms.    Also denies any obvious fluctuation of symptoms with weather or environmental changes or other aggravating or alleviating factors except as outlined above   No unusual exposure hx or h/o childhood pna/ asthma or knowledge of premature birth.  Current Allergies, Complete Past Medical History, Past Surgical History, Family History, and Social History were reviewed in [employer] record.  ROS  The following are not active complaints unless bolded Hoarseness, sore throat, dysphagia, dental problems, itching, sneezing,  nasal congestion or discharge of excess mucus or purulent secretions, ear ache,   fever, chills, sweats, unintended wt loss or wt gain, classically pleuritic or exertional cp,  orthopnea pnd or arm/hand swelling  or leg swelling, presyncope, palpitations, abdominal pain, anorexia, nausea, vomiting, diarrhea  or change in bowel habits or change in bladder habits, change in stools or change in urine, dysuria, hematuria,  rash, arthralgias, visual complaints, headache, numbness,  weakness or ataxia or problems with walking or coordination,  change in mood or  memory.             Past Medical History:  Diagnosis Date   Anxiety    Chronic back pain    Essential hypertension    Heart attack ([place])    Plantar fasciitis    Sciatica     Outpatient Medications Prior to Visit  Medication Sig Dispense Refill   ALPRAZolam (XANAX) 1 MG tablet Take 1 tablet (1 mg total) by mouth 3 (three) times daily as needed for anxiety. 90 tablet 2   amLODipine (NORVASC) 5 MG tablet Take 1 tablet (5 mg total) by mouth daily. 30 tablet 11   aspirin EC 81 MG tablet Take 81 mg by mouth daily.     buPROPion (WELLBUTRIN XL) 300 MG 24 hr tablet Take 1 tablet (300 mg total) by mouth every morning. 90 tablet 2   busPIRone (BUSPAR) 10 MG tablet Take 1 tablet (10 mg total) by mouth 3 (three) times daily. 270 tablet 1   cholecalciferol (VITAMIN D3) 25 MCG (1000 UNIT) tablet Take 1,000 Units by mouth daily.     metoprolol tartrate (LOPRESSOR) 25 MG tablet Take 25 mg by mouth 2 (two) times daily.     nicotine (NICODERM CQ - DOSED IN MG/24 HR) 7 mg/24hr patch Place 1 patch (7 mg total) onto the skin daily. 28 patch 0   nitroGLYCERIN (NITROSTAT) 0.4 MG SL tablet Place 1 tablet (0.4 mg total) under the tongue every 5 (five) minutes  x 3 doses as needed for chest pain. 25 tablet 2   prasugrel (EFFIENT) 10 MG TABS tablet Take 1 tablet (10 mg total) by mouth daily. 90 tablet 2   REPATHA SURECLICK [id number] MG/ML SOAJ Inject 140 mg into the skin every 14 (fourteen) days for 12 doses. 12 mL 3   rosuvastatin (CRESTOR) 40 MG tablet TAKE 1 TABLET BY MOUTH DAILY 90 tablet 2   sacubitril-valsartan (ENTRESTO) 24-26 MG Take 1 tablet by mouth 2 (two) times daily. 60 tablet 11   Study - EVOLVE-MI - evolocumab (REPATHA) 140 mg/mL SQ injection (PI-Stuckey) Inject 1 mL (140 mg total) into the skin every 14 (fourteen) days. For Investigational Use Only. Inject subcutaneously into abdomen, thigh, or upper arm every 14 days.  Rotate injection sites and do not inject into areas where skin is tender, bruised, or red. Please contact [facility] regarding any questions or concerns regarding this medication. 12 mL 0   traZODone (DESYREL) 100 MG tablet Take 1 tablet (100 mg total) by mouth at bedtime. 30 tablet 2   vitamin B-12 (CYANOCOBALAMIN) 500 MCG tablet Take 500 mcg by mouth daily.     albuterol (PROVENTIL HFA;VENTOLIN HFA) 108 (90 BASE) MCG/ACT inhaler Inhale 1-2 puffs into the lungs every 6 (six) hours as needed for wheezing (Use the inhaler for the next 4 days 4 times a day then begin using only if needed for wheezing). 1 Inhaler 0   No facility-administered medications prior to visit.     Objective:     BP 136/84   Pulse 74   Ht '5\' 3"'$  (1.6 m)   Wt 195 lb 3.2 oz (88.5 kg)   LMP 05/25/2016   SpO2 97% Comment: RA  BMI 34.58 kg/m   SpO2: 97 % (RA)  Amb slt tense but pleasant wf nad    HEENT : Oropharynx  clear         NECK :  without  apparent JVD/ palpable Nodes/TM    LUNGS: no acc muscle use,  Nl contour chest with minimal insp /exp rhonchi  bilaterally without cough on insp or exp maneuvers   CV:  RRR  no s3 or murmur or increase in P2, and no edema   ABD:  soft and nontender with nl inspiratory excursion in the supine position. No bruits or organomegaly appreciated   MS:  Nl gait/ ext warm without deformities Or obvious joint restrictions  calf tenderness, cyanosis or clubbing    SKIN: warm and dry without lesions    NEURO:  alert, approp, nl sensorium with  no motor or cerebellar deficits apparent.       Assessment   DOE (dyspnea on exertion) Onset with IHD -  06/29/2022   Walked on RA  x  3lap(s) =  approx 450  ft  @ fast pace, stopped due to end of study  with lowest 02 sats 95% fast pace   Symptoms of sob supine > walking are markedly disproportionate to objective findings and not clear to what extent this is actually a pulmonary  problem but pt does appear to  have difficult to sort out respiratory symptoms of unknown origin for which  DDX  = almost all start with A and  include Adherence, Ace Inhibitors, Acid Reflux, Active Sinus Disease, Alpha 1 Antitripsin deficiency, Anxiety masquerading as Airways dz,  ABPA,  Allergy(esp in young), Aspiration (esp in elderly), Adverse effects of meds,  Active smoking or Vaping, A bunch of PE's/clot burden (a few small  clots can't cause this syndrome unless there is already severe underlying pulm or vascular dz with poor reserve),  Anemia or thyroid disorder, plus two Bs  = Bronchiectasis and Beta blocker use..and one C= CHF    Adherence is always the initial "prime suspect" and is a multilayered concern that requires a "trust but verify" approach in every patient - starting with knowing how to use medications, especially inhalers, correctly, keeping up with refills and understanding the fundamental difference between maintenance and prns vs those medications only taken for a very short course and then stopped and not refilled.  - see hfa teaching - return with all meds in hand using a trust but verify approach to confirm accurate Medication  Reconciliation The principal here is that until we are certain that the  patients are doing what we've asked, it makes no sense to ask them to do more.   Active smoking> see sep a/p  ? Acid (or non-acid) GERD > always difficult to exclude as up to 75% of pts in some series report no assoc GI/ Heartburn symptoms and she has atypical cp > rec max (24h)  acid suppression and diet restrictions/ reviewed and instructions given in writing/also add rx for IBS   ? Allergy/ asthma > check eos/ high dose ics for now in form of breztri trial    ? Adverse drug effects>  The sacubitrilat component of entresto is not and ACEi but it does lead to higher levels of bradykinin (the culprit in ACEi related cough) because it reduces Neprilysin based clearance of bradykinin. The typical symptoms are dry  daytime cough (9% per PI) or complaints of a new sensation of globus or excess PNDS >>> hold off change in rx for now until sort thru DDx at next ov  ? Alpha one AT def > send phenotype   ? Chf > check baseline bnp in case need a trial off entresto  F/u in 4 weeks      Asthmatic bronchitis , chronic Active smoker - 06/29/2022  After extensive coaching inhaler device,  effectiveness =    60% hfa > try breztri 2bid  - Labs ordered 06/29/2022  :  allergy screen  alpha one AT phenotype    Likely group E copd > laba/lama / ics indicated > trial of breztri 2 bid     Cigarette smoker Counseled re importance of smoking cessation but did not meet time criteria for separate billing      Each maintenance medication was reviewed in detail including emphasizing most importantly the difference between maintenance and prns and under what circumstances the prns are to be triggered using an action plan format where appropriate.  Total time for H and P, chart review, counseling, reviewing hfa device(s) , directly observing portions of ambulatory 02 saturation study/ and generating customized AVS unique to this office visit / same day charting  > 45 min new pt eval with unexplained resp symptoms        Christinia Gully, MD 06/29/2022

## 2022-06-29 NOTE — Assessment & Plan Note (Addendum)
Onset with IHD -  06/29/2022   Walked on RA  x  3lap(s) =  approx 450  ft  @ fast pace, stopped due to end of study  with lowest 02 sats 95% fast pace   Symptoms of sob supine > walking are markedly disproportionate to objective findings and not clear to what extent this is actually a pulmonary  problem but pt does appear to have difficult to sort out respiratory symptoms of unknown origin for which  DDX  = almost all start with A and  include Adherence, Ace Inhibitors, Acid Reflux, Active Sinus Disease, Alpha 1 Antitripsin deficiency, Anxiety masquerading as Airways dz,  ABPA,  Allergy(esp in young), Aspiration (esp in elderly), Adverse effects of meds,  Active smoking or Vaping, A bunch of PE's/clot burden (a few small clots can't cause this syndrome unless there is already severe underlying pulm or vascular dz with poor reserve),  Anemia or thyroid disorder, plus two Bs  = Bronchiectasis and Beta blocker use..and one C= CHF    Adherence is always the initial "prime suspect" and is a multilayered concern that requires a "trust but verify" approach in every patient - starting with knowing how to use medications, especially inhalers, correctly, keeping up with refills and understanding the fundamental difference between maintenance and prns vs those medications only taken for a very short course and then stopped and not refilled.  - see hfa teaching - return with all meds in hand using a trust but verify approach to confirm accurate Medication  Reconciliation The principal here is that until we are certain that the  patients are doing what we've asked, it makes no sense to ask them to do more.   Active smoking> see sep a/p  ? Acid (or non-acid) GERD > always difficult to exclude as up to 75% of pts in some series report no assoc GI/ Heartburn symptoms and she has atypical cp > rec max (24h)  acid suppression and diet restrictions/ reviewed and instructions given in writing/also add rx for IBS   ?  Allergy/ asthma > check eos/ high dose ics for now in form of breztri trial    ? Adverse drug effects>  The sacubitrilat component of entresto is not and ACEi but it does lead to higher levels of bradykinin (the culprit in ACEi related cough) because it reduces Neprilysin based clearance of bradykinin. The typical symptoms are dry daytime cough (9% per PI) or complaints of a new sensation of globus or excess PNDS >>> hold off change in rx for now until sort thru DDx at next ov  ? Alpha one AT def > send phenotype   ? Chf > check baseline bnp in case need a trial off entresto  F/u in 4 weeks

## 2022-07-06 LAB — CBC WITH DIFFERENTIAL/PLATELET
Basophils Absolute: 0.1 10*3/uL (ref 0.0–0.2)
Basos: 1 %
EOS (ABSOLUTE): 0.2 10*3/uL (ref 0.0–0.4)
Eos: 3 %
Hematocrit: 42.7 % (ref 34.0–46.6)
Hemoglobin: 14.4 g/dL (ref 11.1–15.9)
Immature Grans (Abs): 0 10*3/uL (ref 0.0–0.1)
Immature Granulocytes: 0 %
Lymphocytes Absolute: 3.3 10*3/uL — ABNORMAL HIGH (ref 0.7–3.1)
Lymphs: 35 %
MCH: 30.4 pg (ref 26.6–33.0)
MCHC: 33.7 g/dL (ref 31.5–35.7)
MCV: 90 fL (ref 79–97)
Monocytes Absolute: 0.8 10*3/uL (ref 0.1–0.9)
Monocytes: 8 %
Neutrophils Absolute: 5 10*3/uL (ref 1.4–7.0)
Neutrophils: 53 %
Platelets: 283 10*3/uL (ref 150–450)
RBC: 4.74 x10E6/uL (ref 3.77–5.28)
RDW: 13.3 % (ref 11.7–15.4)
WBC: 9.3 10*3/uL (ref 3.4–10.8)

## 2022-07-06 LAB — BASIC METABOLIC PANEL
BUN/Creatinine Ratio: 12 (ref 9–23)
BUN: 9 mg/dL (ref 6–24)
CO2: 24 mmol/L (ref 20–29)
Calcium: 10.1 mg/dL (ref 8.7–10.2)
Chloride: 105 mmol/L (ref 96–106)
Creatinine, Ser: 0.74 mg/dL (ref 0.57–1.00)
Glucose: 78 mg/dL (ref 70–99)
Potassium: 4.7 mmol/L (ref 3.5–5.2)
Sodium: 143 mmol/L (ref 134–144)
eGFR: 96 mL/min/{1.73_m2} (ref >59)

## 2022-07-06 LAB — BRAIN NATRIURETIC PEPTIDE: BNP: 31 pg/mL (ref 0.0–100.0)

## 2022-07-06 LAB — ALPHA-1-ANTITRYPSIN PHENOTYP: A-1 Antitrypsin: 158 mg/dL (ref 101–187)

## 2022-07-27 NOTE — Progress Notes (Deleted)
Office Visit    Patient Name: Jean Nelson Date of Encounter: 07/27/2022  PCP:  Teodoro Kil, P[place]  Cardiologist:  Larae Grooms, MD  Advanced Practice Provider:  No care team member to display Electrophysiologist:  None   HPI    Jean Nelson is a 57 y.o. female with a past medical history of CAD status post STEMI, prior acute MI (2 years prior to 2023 and PA with what sounds like a CTO to RCA with no intervention giving collaterals), hypertension, hyperlipidemia, tobacco abuse presents today for follow-up appointment.  She presented 01/31/2022 with chest pain that started at 1 AM that morning.  Was seen in outside ED and transported to [facility].  While in route, EKG with minimal ST elevations in V5 through V6, persistent Q waves in inferior leads.  Taken emergently to Cath Lab with evidence of obstructive disease in the distal LAD with left-to-right collaterals, evidence of obstructive disease in the mid to distal circumflex, CTO of proximal RCA, rapid reconstitution of flow from collaterals, elevated EDP of 17.  Decision was made to proceed with LAD intervention with PTCA with resolution of chest pain at the end of the procedure.  Plan to continue DAPT indefinitely.  Brilinta was not covered by insurance, so she was placed on Effient and aspirin.  Mildly reduced LVEF on echocardiogram.  She was seen 01/21/2022 and presented with a friend.  Reported overall feeling well but continued to have occasional random bouts of sharp shooting chest pains.  Not persistent and did not occur very frequently.  Reported shortness of breath when laying down, positive orthopnea, sleeps on a wedge pillow.  Also, had pain in left shoulder blade.  No edema or weight gain.  Felt better while in the hospital.  Had been afraid to be active and drove for the first time the day prior to her appointment.  Walking around her house but not walking for exercise.  Works part-time  and had not returned to work.  Blood pressure was around [id number] systolic and [id number] diastolic.  Denied palpitations, presyncope, syncope.  No bleeding concerns. Was not taking Amlodipine.   Today, she ***  Past Medical History    Past Medical History:  Diagnosis Date   Anxiety    Chronic back pain    Essential hypertension    Heart attack ([place])    Plantar fasciitis    Sciatica    Past Surgical History:  Procedure Laterality Date   APPENDECTOMY     CARDIAC CATHETERIZATION     CORONARY/GRAFT ACUTE MI REVASCULARIZATION N/A 01-31-2022   Procedure: Coronary/Graft Acute MI Revascularization;  Surgeon: Jettie Booze, MD;  Location: [place] [facility];  Service: Cardiovascular;  Laterality: N/A;   LEFT HEART CATH AND CORONARY ANGIOGRAPHY N/A 01/31/2022   Procedure: LEFT HEART CATH AND CORONARY ANGIOGRAPHY;  Surgeon: Jettie Booze, MD;  Location: [place] [facility];  Service: Cardiovascular;  Laterality: N/A;   TUBAL LIGATION      Allergies  Allergies  Allergen Reactions   Sulfa Antibiotics Swelling   Codeine Hives     EKGs/Labs/Other Studies Reviewed:   The following studies were reviewed today: [facility] Jan 31, 2022     Dist LAD-1 lesion is 100% stenosed.  Balloon angioplasty was performed using a BALLN SAPPHIRE 2.0X15.   Post intervention, there is a 0% residual stenosis.   Dist LAD-2 lesion is 100% stenosed.   Prox RCA lesion is 100% stenosed.  Brisk right  to right and left to right collaterals.   There is mild to moderate left ventricular systolic dysfunction.  Apical hypokinesis related to the distal LAD lesion.   LV end diastolic pressure is normal.   The left ventricular ejection fraction is 45-50% by visual estimate.   There is no aortic valve stenosis.   Significant two-vessel coronary artery disease including chronic total occlusion of the proximal RCA.     Culprit vessel was the occluded distal LAD which was successfully opened with balloon angioplasty.  The  vessel was too small for stenting.  After flow was improved, there did appear to be embolization further down on the vessel.  This was too distal for even a balloon angioplasty.  Patient was therapeutic on heparin.  We are initiating Brilinta antiplatelet therapy for stronger effect.  Will initiate high-dose statin.   I suspect her ECG changes were due to the apical LAD occlusion compromising collaterals to the RCA.   She needs aggressive medical therapy and lifestyle modification including smoking cessation.   Diagnostic Dominance: Right  Intervention        Echo 01/08/22   1. Apical akinesis with overall mild LV dysfunction.   2. Left ventricular ejection fraction, by estimation, is 45 to 50%. The  left ventricle has mildly decreased function. The left ventricle  demonstrates regional wall motion abnormalities (see scoring  diagram/findings for description). There is moderate  left ventricular hypertrophy. Left ventricular diastolic parameters are  consistent with Grade II diastolic dysfunction (pseudonormalization).  Elevated left atrial pressure.   3. Right ventricular systolic function is normal. The right ventricular  size is normal.   4. The mitral valve is normal in structure. Trivial mitral valve  regurgitation. No evidence of mitral stenosis.   5. The aortic valve has an indeterminant number of cusps. Aortic valve  regurgitation is not visualized. No aortic stenosis is present.   6. The inferior vena cava is normal in size with greater than 50%  respiratory variability, suggesting right atrial pressure of 3 mmHg.   Comparison(s): No prior Echocardiogram.     EKG:  EKG is *** ordered today.  The ekg ordered today demonstrates ***  Recent Labs: 01/08/2022: ALT 17 01/09/2022: Magnesium 2.1 06/29/2022: BNP 31.0; BUN 9; Creatinine, Ser 0.74; Hemoglobin 14.4; Platelets 283; Potassium 4.7; Sodium 143  Recent Lipid Panel    Component Value Date/Time   CHOL 115 01/08/2022 0911    TRIG 159 (H) 01/08/2022 0911   HDL 34 (L) 01/08/2022 0911   CHOLHDL 3.4 01/08/2022 0911   VLDL 32 01/08/2022 0911   LDLCALC 49 01/08/2022 0911    Risk Assessment/Calculations:  {Does this patient have ATRIAL FIBRILLATION?:[phone]}  Home Medications   No outpatient medications have been marked as taking for the 07/28/22 encounter (Appointment) with Elgie Collard, PA-C.     Review of Systems   ***   All other systems reviewed and are otherwise negative except as noted above.  Physical Exam    VS:  LMP 05/25/2016  , BMI There is no height or weight on file to calculate BMI.  Wt Readings from Last 3 Encounters:  06/29/22 195 lb 3.2 oz (88.5 kg)  01/21/22 182 lb (82.6 kg)  01/09/22 175 lb 6.4 oz (79.6 kg)     GEN: Well nourished, well developed, in no acute distress. HEENT: normal. Neck: Supple, no JVD, carotid bruits, or masses. Cardiac: ***RRR, no murmurs, rubs, or gallops. No clubbing, cyanosis, edema.  ***Radials/PT 2+ and equal bilaterally.  Respiratory:  ***  Respirations regular and unlabored, clear to auscultation bilaterally. GI: Soft, nontender, nondistended. MS: No deformity or atrophy. Skin: Warm and dry, no rash. Neuro:  Strength and sensation are intact. Psych: Normal affect.  Assessment & Plan    CAD status post STEMI Hypertension Hyperlipidemia LDL goal less than 70 Chronic combined CHF Tobacco abuse  No BP recorded.  {Refresh Note OR Click here to enter BP  :1}***      Disposition: Follow up {follow up:15908} with Larae Grooms, MD or APP.  Signed, Elgie Collard, PA-C 07/27/2022, 8:18 AM [place]

## 2022-07-28 ENCOUNTER — Ambulatory Visit: Payer: 59 | Admitting: Physician Assistant

## 2022-07-28 DIAGNOSIS — Z720 Tobacco use: Secondary | ICD-10-CM

## 2022-07-28 DIAGNOSIS — I5042 Chronic combined systolic (congestive) and diastolic (congestive) heart failure: Secondary | ICD-10-CM

## 2022-07-28 DIAGNOSIS — I10 Essential (primary) hypertension: Secondary | ICD-10-CM

## 2022-07-28 DIAGNOSIS — I2510 Atherosclerotic heart disease of native coronary artery without angina pectoris: Secondary | ICD-10-CM

## 2022-07-28 DIAGNOSIS — E785 Hyperlipidemia, unspecified: Secondary | ICD-10-CM

## 2022-08-10 NOTE — Progress Notes (Deleted)
Jean Nelson, female    DOB: [birth year]    MRN: [id number]   Brief patient profile:  63 yowf  active smoker/MM  referred to pulmonary clinic in [place]  06/29/2022 by Arliss Journey for sob and atypical      History of Present Illness  06/29/2022  Pulmonary/ 1st office eval/ Sharry Beining / [facility] / maint on [employer] Complaint  Patient presents with   Consult    Cough, SOB. Feels SOB laying down at night   Dyspnea:  slow pace walking flat surface  does ok / has trouble raking since since MI  Cough: productive daytime s typical am flare, stays grey all day then dry at hs  Sleep: electric 45 x 4 months, chest gets uncomfortable flatter since then   SABA use: none  02: none  Rec Breztri Take 2 puffs first thing in am and then another 2 puffs about 12 hours later - fill the prescription if you feel it's helping your breathing   Work on inhaler technique:  Your chest pains may just be related to gas in your abdomen or excess acid in your stomach Treatment consists of avoiding foods that cause gasand citrucel 1 tbsp twice daily with a large glass of water Treatment for acid = Pantoprazole (protonix) 40 mg   Take  30-60 min before first meal of the day and Pepcid (famotidine)  20 mg after supper until return to office       Please schedule a follow up office visit in 4 weeks, sooner if needed  with all medications /inhalers/ solutions in hand    Labs ordered 06/29/2022  :  allergy screen  Eos 0.2   alpha one AT phenotype MM  level 158   08/11/2022  f/u ov/[facility]/Tenna Lacko re: *** maint on ***  No chief complaint on file.   Dyspnea:  *** Cough: *** Sleeping: *** SABA use: *** 02: *** Covid status: *** Lung cancer screening: ***   No obvious day to day or daytime variability or assoc excess/ purulent sputum or mucus plugs or hemoptysis or cp or chest tightness, subjective wheeze or overt sinus or hb symptoms.   *** without nocturnal  or early am exacerbation  of  respiratory  c/o's or need for noct saba. Also denies any obvious fluctuation of symptoms with weather or environmental changes or other aggravating or alleviating factors except as outlined above   No unusual exposure hx or h/o childhood pna/ asthma or knowledge of premature birth.  Current Allergies, Complete Past Medical History, Past Surgical History, Family History, and Social History were reviewed in [employer] record.  ROS  The following are not active complaints unless bolded Hoarseness, sore throat, dysphagia, dental problems, itching, sneezing,  nasal congestion or discharge of excess mucus or purulent secretions, ear ache,   fever, chills, sweats, unintended wt loss or wt gain, classically pleuritic or exertional cp,  orthopnea pnd or arm/hand swelling  or leg swelling, presyncope, palpitations, abdominal pain, anorexia, nausea, vomiting, diarrhea  or change in bowel habits or change in bladder habits, change in stools or change in urine, dysuria, hematuria,  rash, arthralgias, visual complaints, headache, numbness, weakness or ataxia or problems with walking or coordination,  change in mood or  memory.        No outpatient medications have been marked as taking for the 08/11/22 encounter (Appointment) with Nyoka Cowden, MD.  Past Medical History:  Diagnosis Date   Anxiety    Chronic back pain    Essential hypertension    Heart attack (HCC)    Plantar fasciitis    Sciatica        Objective:    wts  08/11/2022         ***   06/29/22 195 lb 3.2 oz (88.5 kg)  01/21/22 182 lb (82.6 kg)  01/09/22 175 lb 6.4 oz (79.6 kg)      Vital signs reviewed  08/11/2022  - Note at rest 02 sats  ***% on ***   General appearance:    ***             Assessment

## 2022-08-11 ENCOUNTER — Ambulatory Visit: Payer: 59 | Admitting: Internal Medicine

## 2022-08-28 ENCOUNTER — Ambulatory Visit: Payer: 59 | Admitting: Physician Assistant

## 2022-09-08 ENCOUNTER — Ambulatory Visit: Payer: 59 | Admitting: Nurse Practitioner

## 2022-09-08 NOTE — Progress Notes (Deleted)
Cardiology Office Note:    Date:  09/08/2022   ID:  Jean Nelson, DOB [birth year], MRN [id number]  PCP:  Arliss Journey, PA-C   [facility] Providers Cardiologist:  Lance Muss, MD     Referring MD: Medicine, [facility]*   Chief Complaint: follow-up CAD s/p STEMI  History of Present Illness:    Jean Nelson is a very pleasant 57 y.o. female with a hx of CAD s/p STEMI, prior acute MI 2 years prior to 2023 in [place] with what sounds like a CTO of RCA with no intervention given collaterals, hypertension, hyperlipidemia, tobacco abuse,  She presented 01/08/22 with chest pain that started at 1 AM that day. Was seen at outside ED and transported to [facility]. While en route, EKG with minimal ST elevations in V5-V6, persistent Q waves in inferior leads. Taken emergently to cath lab with evidence of obstructive disease in distal LAD with left-to-right collaterals, evidence of obstructive disease in mid to distal circumflex, CTO of proximal RCA, rapid reconstitution of flow from collaterals, elevated EDP of 17.  Decision was made to proceed with LAD intervention with PTCA with resolution of chest pain at the end of procedure. Plan to continue DAPT indefinitely. Brilinta not covered by insurance, so she is on Effient and aspirin. Mildly reduced LVEF on echo 45-50%.    Seen by me in clinic on 01/21/22, here with her friend. Reports she is overall feeling well but continues to have occasional random bouts of sharp shooting chest pains. These are not persistent and do not occur very frequently. Shortness of breath when lying down, + orthopnea, sleeps on a wedge pillow. Also has pain in left shoulder blade.  No edema, weight gain.  Felt better while in the hospital. Has been afraid to be active, drove for the first time yesterday. Walking around her house but not walking for exercise. Works a part-time job, has not returned. Typical home BP prior to PCI was very high. Since hospital d/c BP home 128-150  systolic and 71-86 diastolic. She denies palpitations, presyncope, syncope.  No bleeding concerns. We discussed medications in detail. Reports she has not been taking amlodipine.  She was encouraged to gradually resume exercise and restart amlodipine 5 mg daily.  Lisinopril was discontinued and she was advised to start Entresto 24-26 milligrams twice daily and continue to monitor BP. Follow-up BMP showed stable renal function and electrolytes.  Encouraged her to reach out to PCP for screening CT for lung cancer and to continue to work on smoking cessation.  Today,   Past Medical History:  Diagnosis Date   Anxiety    Chronic back pain    Essential hypertension    Heart attack (HCC)    Plantar fasciitis    Sciatica     Past Surgical History:  Procedure Laterality Date   APPENDECTOMY     CARDIAC CATHETERIZATION     CORONARY/GRAFT ACUTE MI REVASCULARIZATION N/A 01/08/2022   Procedure: Coronary/Graft Acute MI Revascularization;  Surgeon: Corky Crafts, MD;  Location: [facility] INVASIVE CV LAB;  Service: Cardiovascular;  Laterality: N/A;   LEFT HEART CATH AND CORONARY ANGIOGRAPHY N/A 01/08/2022   Procedure: LEFT HEART CATH AND CORONARY ANGIOGRAPHY;  Surgeon: Corky Crafts, MD;  Location: [facility] INVASIVE CV LAB;  Service: Cardiovascular;  Laterality: N/A;   TUBAL LIGATION      Current Medications: No outpatient medications have been marked as taking for the 09/08/22 encounter (Appointment) with Lissa Hoard, Zachary George, NP.     Allergies:  Sulfa antibiotics and Codeine   Social History   Socioeconomic History   Marital status: Married    Spouse name: Not on file   Number of children: Not on file   Years of education: Not on file   Highest education level: Not on file  Occupational History   Not on file  Tobacco Use   Smoking status: Every Day    Packs/day: .5    Types: Cigarettes   Smokeless tobacco: Never   Tobacco comments:    1/2 PPD PER PATIENT 06/29/22  Vaping Use   Vaping Use:  Never used  Substance and Sexual Activity   Alcohol use: Yes    Comment: occasionally    Drug use: No   Sexual activity: Yes    Birth control/protection: Surgical  Other Topics Concern   Not on file  Social History Narrative   Not on file   Social Determinants of Health   Financial Resource Strain: Not on file  Food Insecurity: Not on file  Transportation Needs: Not on file  Physical Activity: Not on file  Stress: Not on file  Social Connections: Not on file     Family History: The patient's family history includes ADD / ADHD in her mother; Alcohol abuse in her maternal grandfather and maternal uncle; Depression in her maternal grandfather; Hypertension in her mother.  ROS:   Please see the history of present illness.    + orthopnea + SOB All other systems reviewed and are negative.  Labs/Other Studies Reviewed:    The following studies were reviewed today:  LHC 01/08/22    Dist LAD-1 lesion is 100% stenosed.  Balloon angioplasty was performed using a BALLN SAPPHIRE 2.0X15.   Post intervention, there is a 0% residual stenosis.   Dist LAD-2 lesion is 100% stenosed.   Prox RCA lesion is 100% stenosed.  Brisk right to right and left to right collaterals.   There is mild to moderate left ventricular systolic dysfunction.  Apical hypokinesis related to the distal LAD lesion.   LV end diastolic pressure is normal.   The left ventricular ejection fraction is 45-50% by visual estimate.   There is no aortic valve stenosis.   Significant two-vessel coronary artery disease including chronic total occlusion of the proximal RCA.     Culprit vessel was the occluded distal LAD which was successfully opened with balloon angioplasty.  The vessel was too small for stenting.  After flow was improved, there did appear to be embolization further down on the vessel.  This was too distal for even a balloon angioplasty.  Patient was therapeutic on heparin.  We are initiating Brilinta  antiplatelet therapy for stronger effect.  Will initiate high-dose statin.   I suspect her ECG changes were due to the apical LAD occlusion compromising collaterals to the RCA.   She needs aggressive medical therapy and lifestyle modification including smoking cessation.   Diagnostic Dominance: Right  Intervention     Echo 01/08/22  1. Apical akinesis with overall mild LV dysfunction.   2. Left ventricular ejection fraction, by estimation, is 45 to 50%. The  left ventricle has mildly decreased function. The left ventricle  demonstrates regional wall motion abnormalities (see scoring  diagram/findings for description). There is moderate  left ventricular hypertrophy. Left ventricular diastolic parameters are  consistent with Grade II diastolic dysfunction (pseudonormalization).  Elevated left atrial pressure.   3. Right ventricular systolic function is normal. The right ventricular  size is normal.   4. The mitral  valve is normal in structure. Trivial mitral valve  regurgitation. No evidence of mitral stenosis.   5. The aortic valve has an indeterminant number of cusps. Aortic valve  regurgitation is not visualized. No aortic stenosis is present.   6. The inferior vena cava is normal in size with greater than 50%  respiratory variability, suggesting right atrial pressure of 3 mmHg.   Comparison(s): No prior Echocardiogram.    Recent Labs: 01/08/2022: ALT 17 01/09/2022: Magnesium 2.1 06/29/2022: BNP 31.0; BUN 9; Creatinine, Ser 0.74; Hemoglobin 14.4; Platelets 283; Potassium 4.7; Sodium 143  Recent Lipid Panel    Component Value Date/Time   CHOL 115 01/08/2022 0911   TRIG 159 (H) 01/08/2022 0911   HDL 34 (L) 01/08/2022 0911   CHOLHDL 3.4 01/08/2022 0911   VLDL 32 01/08/2022 0911   LDLCALC 49 01/08/2022 0911     Risk Assessment/Calculations:      Physical Exam:    VS:  LMP 05/25/2016     Wt Readings from Last 3 Encounters:  06/29/22 195 lb 3.2 oz (88.5 kg)  01/21/22  182 lb (82.6 kg)  01/09/22 175 lb 6.4 oz (79.6 kg)     GEN: Well nourished, well developed in no acute distress HEENT: Normal NECK: No JVD; No carotid bruits CARDIAC: RRR, no murmurs, rubs, gallops RESPIRATORY:  Coarse breath sounds bilaterally without wheezing or rhonchi  ABDOMEN: Soft, non-tender, non-distended MUSCULOSKELETAL:  No edema; No deformity. 2+ pedal pulses, equal bilaterally SKIN: Warm and dry NEUROLOGIC:  Alert and oriented x 3 PSYCHIATRIC:  Normal affect   EKG:  EKG is ordered today.  The ekg ordered today demonstrates sinus bradycardia at 56 bpm, persistent Q waves inferior and anterior leads, TWI III, aVF, V4-V6, no acute change from previous tracing  Diagnoses:    No diagnosis found.  Assessment and Plan:     CAD s/p STEMI: Presented with chest pain, found to have distantly occluded LAD which was successfully opened with balloon angioplasty.  Vessel too small for stenting.  After flow improved, appearance of embolization further down the vessel too small for balloon angioplasty. Treated with heparin and Brilinta with resolution of chest pain. DAPT with Effient (due to insurance) and ASA. Random sharp shooting chest pains that are not persistent and do not worsen with exertion. Has SOB as noted below. I do not feel this is anginal in nature.  Encouraged her to gradually increase activity including walking for at least 15 minutes daily with plan to increase to 30 minutes 5 days per week.  She denies significant bruising, swelling, or pain at femoral site of catheterization. Strongly encouraged Cardiac Rehab and she is agreeable to participate.  Continue Effient, aspirin, lipid therapy. Will restart amlodipine at 5 mg daily.   Hypertension: BP is mildly elevated today. Home BP readings also consistently elevated. Aldosterone/renin ration normal during hospitalization, not contributing to elevated BP.  Restarting amlodipine at 5 mg daily. We will d/c lisinopril and start  Entresto 24-26 mg twice daily for CHF. Advised her to continue to monitor BP.   Hyperlipidemia LDL goal < 70: Lipo(a) is normal. LDL 49 on 01/08/22. Emphasized the importance of continued well-controlled lipids. Enrolled in EVOLVE MI study.   Chronic combined CHF: NYHA Class II. Mildly reduced LVEF 45-50%, G2DD on echo 01/08/22. Reports symptoms of SOB, orthopnea. No edema or PND. Appears euvolemic on exam. Marguerita Merles 24-26 twice daily for symptoms of CHF, better BP control. Will d/c lisinopril for 2 days then start Entresto. We will check  bmet 10 days after start of Entresto. Also encouraged her to get screening CT for lung cancer. She prefers to arrange with her PCP.  Tobacco abuse: Is taking Wellbutrin. Was previously smoking 2 pack/day. Smoked 6 cigarettes yesterday. Reports commitment to quit. Advised her to call back if she needs additional resources for complete cessation.        Disposition: ***  Medication Adjustments/Labs and Tests Ordered: Current medicines are reviewed at length with the patient today.  Concerns regarding medicines are outlined above.  No orders of the defined types were placed in this encounter.  No orders of the defined types were placed in this encounter.   There are no Patient Instructions on file for this visit.   Signed, Levi Aland, NP  09/08/2022 5:10 AM    [facility]

## 2022-09-16 ENCOUNTER — Other Ambulatory Visit: Payer: Self-pay | Admitting: Cardiology

## 2022-09-16 NOTE — Telephone Encounter (Signed)
Pt requesting refill on medication. Please address

## 2022-10-06 ENCOUNTER — Encounter (HOSPITAL_COMMUNITY): Payer: Self-pay | Admitting: Psychiatry

## 2022-10-06 ENCOUNTER — Telehealth (INDEPENDENT_AMBULATORY_CARE_PROVIDER_SITE_OTHER): Payer: 59 | Admitting: Psychiatry

## 2022-10-06 DIAGNOSIS — F411 Generalized anxiety disorder: Secondary | ICD-10-CM | POA: Diagnosis not present

## 2022-10-06 MED ORDER — ALPRAZOLAM 1 MG PO TABS: 1.0000 mg | ORAL_TABLET | Freq: Three times a day (TID) | ORAL | 2 refills | Status: DC | PRN

## 2022-10-06 MED ORDER — BUPROPION HCL ER (XL) 300 MG PO TB24: 300.0000 mg | ORAL_TABLET | ORAL | 2 refills | Status: DC

## 2022-10-06 MED ORDER — BUSPIRONE HCL 10 MG PO TABS: 10.0000 mg | ORAL_TABLET | Freq: Three times a day (TID) | ORAL | 1 refills | Status: DC

## 2022-10-06 MED ORDER — TRAZODONE HCL 100 MG PO TABS: 100.0000 mg | ORAL_TABLET | Freq: Every day | ORAL | 2 refills | Status: DC

## 2022-10-06 NOTE — Progress Notes (Signed)
Virtual Visit via Video Note  I connected with Jean Nelson on 10/06/22 at 10:40 AM EDT by a video enabled telemedicine application and verified that I am speaking with the correct person using two identifiers.  Location: Patient: home Provider: office   I discussed the limitations of evaluation and management by telemedicine and the availability of in person appointments. The patient expressed understanding and agreed to proceed.     I discussed the assessment and treatment plan with the patient. The patient was provided an opportunity to ask questions and all were answered. The patient agreed with the plan and demonstrated an understanding of the instructions.   The patient was advised to call back or seek an in-person evaluation if the symptoms worsen or if the condition fails to improve as anticipated.  I provided 15 minutes of non-face-to-face time during this encounter.   [name], MD  [facility] MD/PA/NP OP Progress Note  10/06/2022 10:56 AM Jean Nelson  MRN:  [id number]  Chief Complaint:  Chief Complaint  Patient presents with   Depression   Anxiety   ADD   Follow-up   HPI: This patient is a 57 year old female [place]  She is [place].  The patient returns for follow-up after 3 months regarding her depression and anxiety. As noted in previous visits the patient had a balloon angioplasty last year.  She is now on a study drug which is  IM repatha.  She states this is really helped bring down her blood pressure.  She could not complete cardiac rehab because she had trouble breathing.  Since then she is seeing a pulmonologist and has been put on breztri inhaler which has helped.  She does not tolerate going out in the heat very well.  In terms of her mood she is doing well and denies significant depression.  Her anxiety is under good control.  She denies any thoughts of self-harm or suicide.  She is sleeping well.  She is enjoying  time with her grandchildren.  She states that she is now smoking under I half pack a day which is a big improvement.  Visit Diagnosis:    ICD-10-CM   1. Generalized anxiety disorder  F41.1       Past Psychiatric History: none  Past Medical History:  Past Medical History:  Diagnosis Date   Anxiety    Chronic back pain    Essential hypertension    Heart attack (HCC)    Plantar fasciitis    Sciatica     Past Surgical History:  Procedure Laterality Date   APPENDECTOMY     CARDIAC CATHETERIZATION     CORONARY/GRAFT ACUTE MI REVASCULARIZATION N/A 01/08/2022   Procedure: Coronary/Graft Acute MI Revascularization;  Surgeon: Corky Crafts, MD;  Location: [facility] INVASIVE CV LAB;  Service: Cardiovascular;  Laterality: N/A;   LEFT HEART CATH AND CORONARY ANGIOGRAPHY N/A 01/08/2022   Procedure: LEFT HEART CATH AND CORONARY ANGIOGRAPHY;  Surgeon: Corky Crafts, MD;  Location: [facility] INVASIVE CV LAB;  Service: Cardiovascular;  Laterality: N/A;   TUBAL LIGATION      Family Psychiatric History: See below  Family History:  Family History  Problem Relation Age of Onset   ADD / ADHD Mother    Hypertension Mother    Alcohol abuse Maternal Uncle    Depression Maternal Grandfather    Alcohol abuse Maternal Grandfather     Social History:  Social History   Socioeconomic History   Marital status:  Married    Spouse name: Not on file   Number of children: Not on file   Years of education: Not on file   Highest education level: Not on file  Occupational History   Not on file  Tobacco Use   Smoking status: Every Day    Packs/day: .5    Types: Cigarettes   Smokeless tobacco: Never   Tobacco comments:    1/2 PPD PER PATIENT 06/29/22  Vaping Use   Vaping Use: Never used  Substance and Sexual Activity   Alcohol use: Yes    Comment: occasionally    Drug use: No   Sexual activity: Yes    Birth control/protection: Surgical  Other Topics Concern   Not on file  Social History  Narrative   Not on file   Social Determinants of Health   Financial Resource Strain: Not on file  Food Insecurity: Not on file  Transportation Needs: Not on file  Physical Activity: Not on file  Stress: Not on file  Social Connections: Not on file    Allergies:  Allergies  Allergen Reactions   Sulfa Antibiotics Swelling   Codeine Hives    Metabolic Disorder Labs: Lab Results  Component Value Date   HGBA1C 5.6 01/08/2022   MPG 114.02 01/08/2022   MPG 114.02 01/08/2022   No results found for: "PROLACTIN" Lab Results  Component Value Date   CHOL 115 01/08/2022   TRIG 159 (H) 01/08/2022   HDL 34 (L) 01/08/2022   CHOLHDL 3.4 01/08/2022   VLDL 32 01/08/2022   LDLCALC 49 01/08/2022   LDLCALC 34 01/08/2022   No results found for: "TSH"  Therapeutic Level Labs: No results found for: "LITHIUM" No results found for: "VALPROATE" No results found for: "CBMZ"  Current Medications: Current Outpatient Medications  Medication Sig Dispense Refill   albuterol (PROVENTIL HFA;VENTOLIN HFA) 108 (90 BASE) MCG/ACT inhaler Inhale 1-2 puffs into the lungs every 6 (six) hours as needed for wheezing (Use the inhaler for the next 4 days 4 times a day then begin using only if needed for wheezing). 1 Inhaler 0   ALPRAZolam (XANAX) 1 MG tablet Take 1 tablet (1 mg total) by mouth 3 (three) times daily as needed for anxiety. 90 tablet 2   amLODipine (NORVASC) 5 MG tablet Take 1 tablet (5 mg total) by mouth daily. 30 tablet 11   aspirin EC 81 MG tablet Take 81 mg by mouth daily.     Budeson-Glycopyrrol-Formoterol (BREZTRI AEROSPHERE) 160-9-4.8 MCG/ACT AERO Inhale 2 puffs into the lungs 2 (two) times daily. 10.7 g 11   buPROPion (WELLBUTRIN XL) 300 MG 24 hr tablet Take 1 tablet (300 mg total) by mouth every morning. 90 tablet 2   busPIRone (BUSPAR) 10 MG tablet Take 1 tablet (10 mg total) by mouth 3 (three) times daily. 270 tablet 1   cholecalciferol (VITAMIN D3) 25 MCG (1000 UNIT) tablet Take  1,000 Units by mouth daily.     metoprolol tartrate (LOPRESSOR) 25 MG tablet Take 25 mg by mouth 2 (two) times daily.     nicotine (NICODERM CQ - DOSED IN MG/24 HR) 7 mg/24hr patch Place 1 patch (7 mg total) onto the skin daily. 28 patch 0   nitroGLYCERIN (NITROSTAT) 0.4 MG SL tablet Place 1 tablet (0.4 mg total) under the tongue every 5 (five) minutes x 3 doses as needed for chest pain. 25 tablet 2   prasugrel (EFFIENT) 10 MG TABS tablet Take 1 tablet (10 mg total) by mouth daily. Pt  needs appt in August for future refills. 30 tablet 3   REPATHA SURECLICK 140 MG/ML SOAJ Inject 140 mg into the skin every 14 (fourteen) days for 12 doses. 12 mL 3   rosuvastatin (CRESTOR) 40 MG tablet TAKE 1 TABLET BY MOUTH DAILY 90 tablet 2   sacubitril-valsartan (ENTRESTO) 24-26 MG Take 1 tablet by mouth 2 (two) times daily. 60 tablet 11   Study - EVOLVE-MI - evolocumab (REPATHA) 140 mg/mL SQ injection (PI-Stuckey) Inject 1 mL (140 mg total) into the skin every 14 (fourteen) days. For Investigational Use Only. Inject subcutaneously into abdomen, thigh, or upper arm every 14 days. Rotate injection sites and do not inject into areas where skin is tender, bruised, or red. Please contact [facility] regarding any questions or concerns regarding this medication. 12 mL 0   traZODone (DESYREL) 100 MG tablet Take 1 tablet (100 mg total) by mouth at bedtime. 30 tablet 2   vitamin B-12 (CYANOCOBALAMIN) 500 MCG tablet Take 500 mcg by mouth daily.     No current facility-administered medications for this visit.     Musculoskeletal: Strength & Muscle Tone: within normal limits Gait & Station: normal Patient leans: N/A  Psychiatric Specialty Exam: Review of Systems  HENT:  Positive for voice change.   Respiratory:  Positive for shortness of breath.   All other systems reviewed and are negative.   Last menstrual period 05/25/2016.There is no height or weight on file to calculate BMI.  General Appearance:  Casual and Fairly Groomed  Eye Contact:  Good  Speech:  Clear and Coherent  Volume:  Normal  Mood:  Euthymic  Affect:  Congruent  Thought Process:  Goal Directed  Orientation:  Full (Time, Place, and Person)  Thought Content: WDL   Suicidal Thoughts:  No  Homicidal Thoughts:  No  Memory:  Immediate;   Good Recent;   Good Remote;   Good  Judgement:  Good  Insight:  Fair  Psychomotor Activity:  Decreased  Concentration:  Concentration: Fair and Attention Span: Fair  Recall:  Good  Fund of Knowledge: Good  Language: Good  Akathisia:  No  Handed:  Right  AIMS (if indicated): not done  Assets:  Communication Skills Desire for Improvement Resilience Social Support  ADL's:  Intact  Cognition: WNL  Sleep:  Good   Screenings: PHQ2-9    Flowsheet Row Video Visit from 01/06/2022 in [facility] at [place] Video Visit from 10/06/2021 in [facility] at [place] Video Visit from 07/02/2021 in [facility] at [place] Video Visit from 12/31/2020 in [facility] at [facility] Total Score 0 1 0 1      Flowsheet Row ED to Hosp-Admission (Discharged) from 01/08/2022 in [place] [facility] CARDIOVASCULAR ICU Video Visit from 01/06/2022 in [facility] at [place] Video Visit from 10/06/2021 in [facility] at [place]  C-SSRS RISK CATEGORY No Risk No Risk No Risk        Assessment and Plan: This patient is a 57 year old female with a history of ADHD depression and anxiety.  She seems to be much less anxious now and has come to terms with her heart condition and what she needs to do for prevention.  She is no longer taking medication for ADHD because it creased her blood pressure and she seems to do all right without it.  For now she will continue trazodone 100 mg at bedtime  for sleep, BuSpar 10 mg 3 times daily in addition  to Xanax 1 mg 3 times daily for anxiety and Wellbutrin XL 300 mg daily for depression and smoking cessation.  She will return to see me in 3 months  Collaboration of Care: Collaboration of Care: Other provider involved in patient's care AEB notes are shared with cardiology in the epic system  Patient/Guardian was advised Release of Information must be obtained prior to any record release in order to collaborate their care with an outside provider. Patient/Guardian was advised if they have not already done so to contact the registration department to sign all necessary forms in order for [place] to release information regarding their care.   Consent: Patient/Guardian gives verbal consent for treatment and assignment of benefits for services provided during this visit. Patient/Guardian expressed understanding and agreed to proceed.    [name], MD 10/06/2022, 10:56 AM

## 2022-10-28 ENCOUNTER — Other Ambulatory Visit (HOSPITAL_COMMUNITY): Payer: Self-pay

## 2022-11-04 ENCOUNTER — Encounter: Payer: Self-pay | Admitting: *Deleted

## 2022-11-04 DIAGNOSIS — Z006 Encounter for examination for normal comparison and control in clinical research program: Secondary | ICD-10-CM

## 2022-11-04 NOTE — Research (Addendum)
36 Week Follow-Up Visit Completed*   []  Not Necessary, No Potential Adverse Events Or Medication Issues Reported On Completed Subject Questionnaire   [x]  Yes, Contact With Subject/Alternate Contact Completed   []  Yes, No Contact With Subject/Alternate Contact Completed, But Electronic Health Record Was Reviewed   []  No, Unable To Contact Subject/Alternate Contact   Have you reviewed Ongoing medications on the Targeted Concomitant Medication form and updated the form as needed?   [x]  Yes   []  No   Subject Status*   []  Continuing In Follow-up   []  At Risk For Lost To Follow-up   []  Withdrawal From All Future Study Activities Including Passive Follow-up By Electronic Health Record Review Or Contact With Healthcare Provider Or Family Member/Friend   []  Death   Vital Status*   [x]  Alive   []  Deceased   []  Unknown   Last Known To Be Alive Source*   [x]  Subject Completed Follow-up Questionnaire/Seen In Person/Via Telephone Contact   []  Family Member or Caretaker   []  Primary Physician Or Medical Records   []  Publicly Available Source   []  Other  Date of last dose taken   21/Jun/2024  Over the last 12 weeks did the subject miss any doses? No   Over the last 12 weeks did the subject restart Evolocumab after an interruption? No

## 2022-12-02 ENCOUNTER — Other Ambulatory Visit (HOSPITAL_COMMUNITY): Payer: Self-pay

## 2022-12-02 ENCOUNTER — Other Ambulatory Visit: Payer: Self-pay

## 2022-12-02 MED ORDER — REPATHA SURECLICK 140 MG/ML ~~LOC~~ SOAJ
140.0000 mg | SUBCUTANEOUS | 2 refills | Status: DC
  Filled 2022-12-02 – 2022-12-03 (×2): qty 12, 168d supply, fill #0
  Filled 2023-05-17: qty 12, 168d supply, fill #1

## 2022-12-03 ENCOUNTER — Other Ambulatory Visit: Payer: Self-pay

## 2022-12-16 DIAGNOSIS — G8929 Other chronic pain: Secondary | ICD-10-CM | POA: Diagnosis not present

## 2022-12-16 DIAGNOSIS — M545 Low back pain, unspecified: Secondary | ICD-10-CM | POA: Diagnosis not present

## 2022-12-16 DIAGNOSIS — R079 Chest pain, unspecified: Secondary | ICD-10-CM | POA: Diagnosis not present

## 2022-12-16 DIAGNOSIS — R053 Chronic cough: Secondary | ICD-10-CM | POA: Diagnosis not present

## 2023-01-06 ENCOUNTER — Telehealth (INDEPENDENT_AMBULATORY_CARE_PROVIDER_SITE_OTHER): Payer: 59 | Admitting: Psychiatry

## 2023-01-06 ENCOUNTER — Encounter (HOSPITAL_COMMUNITY): Payer: Self-pay | Admitting: Psychiatry

## 2023-01-06 ENCOUNTER — Encounter: Payer: Self-pay | Admitting: *Deleted

## 2023-01-06 DIAGNOSIS — F411 Generalized anxiety disorder: Secondary | ICD-10-CM

## 2023-01-06 DIAGNOSIS — Z006 Encounter for examination for normal comparison and control in clinical research program: Secondary | ICD-10-CM

## 2023-01-06 MED ORDER — VARENICLINE TARTRATE 0.5 MG PO TABS: 0.5000 mg | ORAL_TABLET | Freq: Two times a day (BID) | ORAL | 2 refills | Status: DC

## 2023-01-06 MED ORDER — BUPROPION HCL ER (XL) 300 MG PO TB24: 300.0000 mg | ORAL_TABLET | ORAL | 2 refills | Status: DC

## 2023-01-06 MED ORDER — ALPRAZOLAM 1 MG PO TABS: 1.0000 mg | ORAL_TABLET | Freq: Three times a day (TID) | ORAL | 2 refills | Status: DC | PRN

## 2023-01-06 MED ORDER — BUSPIRONE HCL 10 MG PO TABS: 10.0000 mg | ORAL_TABLET | Freq: Three times a day (TID) | ORAL | 1 refills | Status: DC

## 2023-01-06 MED ORDER — TRAZODONE HCL 100 MG PO TABS: 100.0000 mg | ORAL_TABLET | Freq: Every day | ORAL | 2 refills | Status: DC

## 2023-01-06 NOTE — Progress Notes (Signed)
Virtual Visit via Video Note  I connected with Jean Nelson on 01/06/23 at 10:20 AM EDT by a video enabled telemedicine application and verified that I am speaking with the correct person using two identifiers.  Location: Patient: home Provider: office   I discussed the limitations of evaluation and management by telemedicine and the availability of in person appointments. The patient expressed understanding and agreed to proceed.     I discussed the assessment and treatment plan with the patient. The patient was provided an opportunity to ask questions and all were answered. The patient agreed with the plan and demonstrated an understanding of the instructions.   The patient was advised to call back or seek an in-person evaluation if the symptoms worsen or if the condition fails to improve as anticipated.  I provided 15 minutes of non-face-to-face time during this encounter.   [name], MD  [facility] MD/PA/NP OP Progress Note  01/06/2023 10:28 AM Jean Nelson  MRN:  [id number]  Chief Complaint:  Chief Complaint  Patient presents with   Anxiety   Depression   Follow-up   HPI: This patient is a 57 year old female who lives with her 2 grandchildren in [place].  She is unemployed.  The patient returns for follow-up after 3 months regarding her depression and anxiety.  The patient states that she is doing about the same.  She feels like her depression and anxiety are well-controlled.  She used to be on medication for ADHD but her cardiologist does not want her to be on this and she seems to be functioning well without it.  She denies any thoughts of self-harm or suicide.  She states however that she is still smoking cigarettes and has been hard for her to quit.  This is vitally important since she had balloon angioplasty last year.  I offered to add Chantix to her regimen and she would like to try it. Visit Diagnosis:    ICD-10-CM   1. Generalized anxiety disorder  F41.1        Past Psychiatric History: none  Past Medical History:  Past Medical History:  Diagnosis Date   Anxiety    Chronic back pain    Essential hypertension    Heart attack (HCC)    Plantar fasciitis    Sciatica     Past Surgical History:  Procedure Laterality Date   APPENDECTOMY     CARDIAC CATHETERIZATION     CORONARY/GRAFT ACUTE MI REVASCULARIZATION N/A 01/08/2022   Procedure: Coronary/Graft Acute MI Revascularization;  Surgeon: Corky Crafts, MD;  Location: [facility] INVASIVE CV LAB;  Service: Cardiovascular;  Laterality: N/A;   LEFT HEART CATH AND CORONARY ANGIOGRAPHY N/A 01/08/2022   Procedure: LEFT HEART CATH AND CORONARY ANGIOGRAPHY;  Surgeon: Corky Crafts, MD;  Location: [facility] INVASIVE CV LAB;  Service: Cardiovascular;  Laterality: N/A;   TUBAL LIGATION      Family Psychiatric History: See below  Family History:  Family History  Problem Relation Age of Onset   ADD / ADHD Mother    Hypertension Mother    Alcohol abuse Maternal Uncle    Depression Maternal Grandfather    Alcohol abuse Maternal Grandfather     Social History:  Social History   Socioeconomic History   Marital status: Married    Spouse name: Not on file   Number of children: Not on file   Years of education: Not on file   Highest education level: Not on file  Occupational History   Not on  file  Tobacco Use   Smoking status: Every Day    Current packs/day: 0.50    Types: Cigarettes   Smokeless tobacco: Never   Tobacco comments:    1/2 PPD PER PATIENT 06/29/22  Vaping Use   Vaping status: Never Used  Substance and Sexual Activity   Alcohol use: Yes    Comment: occasionally    Drug use: No   Sexual activity: Yes    Birth control/protection: Surgical  Other Topics Concern   Not on file  Social History Narrative   Not on file   Social Determinants of Health   Financial Resource Strain: High Risk (05/20/2022)   Received from [employer], [facility]   Overall Financial Resource  Strain (CARDIA)    Difficulty of Paying Living Expenses: Hard  Food Insecurity: Food Insecurity Present (05/20/2022)   Received from [facility], [facility]   Hunger Vital Sign    Worried About Running Out of Food in the Last Year: Sometimes true    Ran Out of Food in the Last Year: Sometimes true  Transportation Needs: Unmet Transportation Needs (05/20/2022)   [facility], [facility]   PRAPARE - Transportation    Lack of Transportation (Medical): Yes    Lack of Transportation (Non-Medical): Patient declined  Physical Activity: Unknown (05/20/2022)   Received from [facility], [facility]   Exercise Vital Sign    Days of Exercise per Week: Patient declined    Minutes of Exercise per Session: Not on file  Stress: Stress Concern Present (05/20/2022)   [facility] from [facility], [facility]   [employer] of Occupational Health - Occupational Stress Questionnaire    Feeling of Stress : Rather much  Social Connections: Unknown (11/07/2022)   Received from [facility]   Social Network    Social Network: Not on file    Allergies:  Allergies  Allergen Reactions   Sulfa Antibiotics Swelling   Codeine Hives    Metabolic Disorder Labs: Lab Results  Component Value Date   HGBA1C 5.6 01/08/2022   MPG 114.02 01/08/2022   MPG 114.02 01/08/2022   No results found for: "PROLACTIN" Lab Results  Component Value Date   CHOL 115 01/08/2022   TRIG 159 (H) 01/08/2022   HDL 34 (L) 01/08/2022   CHOLHDL 3.4 01/08/2022   VLDL 32 01/08/2022   LDLCALC 49 01/08/2022   LDLCALC 34 01/08/2022   No results found for: "TSH"  Therapeutic Level Labs: No results found for: "LITHIUM" No results found for: "VALPROATE" No results found for: "CBMZ"  Current Medications: Current Outpatient Medications  Medication Sig Dispense Refill   varenicline (CHANTIX) 0.5 MG tablet Take 1 tablet (0.5 mg total) by mouth 2 (two) times daily. 60 tablet 2   albuterol (PROVENTIL  HFA;VENTOLIN HFA) 108 (90 BASE) MCG/ACT inhaler Inhale 1-2 puffs into the lungs every 6 (six) hours as needed for wheezing (Use the inhaler for the next 4 days 4 times a day then begin using only if needed for wheezing). 1 Inhaler 0   ALPRAZolam (XANAX) 1 MG tablet Take 1 tablet (1 mg total) by mouth 3 (three) times daily as needed for anxiety. 90 tablet 2   amLODipine (NORVASC) 5 MG tablet Take 1 tablet (5 mg total) by mouth daily. 30 tablet 11   aspirin EC 81 MG tablet Take 81 mg by mouth daily.     Budeson-Glycopyrrol-Formoterol (BREZTRI AEROSPHERE) 160-9-4.8 MCG/ACT AERO Inhale 2 puffs into the lungs 2 (two) times daily. 10.7 g  11   buPROPion (WELLBUTRIN XL) 300 MG 24 hr tablet Take 1 tablet (300 mg total) by mouth every morning. 90 tablet 2   busPIRone (BUSPAR) 10 MG tablet Take 1 tablet (10 mg total) by mouth 3 (three) times daily. 270 tablet 1   cholecalciferol (VITAMIN D3) 25 MCG (1000 UNIT) tablet Take 1,000 Units by mouth daily.     Evolocumab (REPATHA SURECLICK) 140 MG/ML SOAJ Inject 140 mg into the skin every 14 (fourteen) days. For Investigational Use Only. Inject subcutaneously into abdomen, thigh, or upper arm every 14 days. Rotate injection sites and do not inject into areas where skin is tender, bruised, or red. Please contact [facility] for any questions or concerns regarding this medication. 12 mL 2   metoprolol tartrate (LOPRESSOR) 25 MG tablet Take 25 mg by mouth 2 (two) times daily.     nicotine (NICODERM CQ - DOSED IN MG/24 HR) 7 mg/24hr patch Place 1 patch (7 mg total) onto the skin daily. 28 patch 0   nitroGLYCERIN (NITROSTAT) 0.4 MG SL tablet Place 1 tablet (0.4 mg total) under the tongue every 5 (five) minutes x 3 doses as needed for chest pain. 25 tablet 2   prasugrel (EFFIENT) 10 MG TABS tablet Take 1 tablet (10 mg total) by mouth daily. Pt needs appt in August for future refills. 30 tablet 3   rosuvastatin (CRESTOR) 40 MG tablet TAKE 1 TABLET BY MOUTH DAILY  90 tablet 2   sacubitril-valsartan (ENTRESTO) 24-26 MG Take 1 tablet by mouth 2 (two) times daily. 60 tablet 11   traZODone (DESYREL) 100 MG tablet Take 1 tablet (100 mg total) by mouth at bedtime. 30 tablet 2   vitamin B-12 (CYANOCOBALAMIN) 500 MCG tablet Take 500 mcg by mouth daily.     No current facility-administered medications for this visit.     Musculoskeletal: Strength & Muscle Tone: within normal limits Gait & Station: normal Patient leans: N/A  Psychiatric Specialty Exam: Review of Systems  All other systems reviewed and are negative.   Last menstrual period 05/25/2016.There is no height or weight on file to calculate BMI.  General Appearance: Casual and Fairly Groomed  Eye Contact:  Good  Speech:  Clear and Coherent  Volume:  Normal  Mood:  Euthymic  Affect:  Congruent  Thought Process:  Goal Directed  Orientation:  Full (Time, Place, and Person)  Thought Content: WDL   Suicidal Thoughts:  No  Homicidal Thoughts:  No  Memory:  Immediate;   Good Recent;   Good Remote;   Good  Judgement:  Good  Insight:  Fair  Psychomotor Activity:  Normal  Concentration:  Concentration: Good and Attention Span: Good  Recall:  Good  Fund of Knowledge: Good  Language: Good  Akathisia:  No  Handed:  Right  AIMS (if indicated): not done  Assets:  Communication Skills Desire for Improvement Resilience Social Support Talents/Skills  ADL's:  Intact  Cognition: WNL  Sleep:  Good   Screenings: PHQ2-9    Flowsheet Row Video Visit from 01/06/2022 in [facility] [place] Video Visit from 10/06/2021 in [facility] at [place] Video Visit from 07/02/2021 in [facility] at [place] Video Visit from 12/31/2020 in [facility] Total Score 0 1 0 1      Flowsheet Row ED to Hosp-Admission (Discharged) from 01/08/2022 in [facility]  ICU Video Visit from 01/06/2022 in [place]  [facility] Video Visit from 10/06/2021 in [facility] at [place]  C-SSRS RISK CATEGORY No Risk No Risk No Risk        Assessment and Plan: This patient is a 57 year old female with a history of ADHD depression and anxiety.  She is no longer taking medication for ADHD and seems to be okay without it.  She is doing well on her current regimen.  She will continue Wellbutrin XL 300 mg daily for depression, BuSpar 10 mg 3 times daily in addition to Xanax 1 mg 3 times daily as needed for anxiety and trazodone 100 mg at bedtime for sleep.  Will also add Chantix 0.5 mg twice daily for smoking cessation.  She will return to see me in 3 months  Collaboration of Care: Collaboration of Care: Other provider involved in patient's care AEB notes are shared with cardiology on the epic system  Patient/Guardian was advised Release of Information must be obtained prior to any record release in order to collaborate their care with an outside provider. Patient/Guardian was advised if they have not already done so to contact the registration department to sign all necessary forms in order for [place] to release information regarding their care.   Consent: Patient/Guardian gives verbal consent for treatment and assignment of benefits for services provided during this visit. Patient/Guardian expressed understanding and agreed to proceed.    [name], MD 01/06/2023, 10:28 AM

## 2023-01-07 ENCOUNTER — Other Ambulatory Visit (HOSPITAL_COMMUNITY): Payer: Self-pay

## 2023-01-07 ENCOUNTER — Telehealth: Payer: Self-pay

## 2023-01-07 NOTE — Telephone Encounter (Signed)
Pharmacy Patient Advocate Encounter   Received notification from CoverMyMeds that prior authorization for Entresto 24-26 is required/requested.   Insurance verification completed.   The patient is insured through [employer] .   Per test claim: PA required; PA submitted to [employer] via CoverMyMeds Key/confirmation #/EOC Key: [id number]   Status is pending

## 2023-01-08 ENCOUNTER — Other Ambulatory Visit (HOSPITAL_COMMUNITY): Payer: Self-pay

## 2023-01-08 NOTE — Telephone Encounter (Signed)
Pharmacy Patient Advocate Encounter  Received notification from [employer] that Prior Authorization for entresto has been CANCELLED due to received notification from insurance that PA is not needed and medication is too soon. Next available fill date is 01/31/23. Media will be uploaded to chart

## 2023-01-23 ENCOUNTER — Encounter (HOSPITAL_COMMUNITY): Payer: Self-pay

## 2023-01-25 NOTE — Research (Signed)
48 Week Follow-Up Visit Completed*   []  Not Necessary, No Potential Adverse Events Or Medication Issues Reported On Completed Subject Questionnaire   [x]  Yes, Contact With Subject/Alternate Contact Completed   []  Yes, No Contact With Subject/Alternate Contact Completed, But Electronic Health Record Was Reviewed   []  No, Unable To Contact Subject/Alternate Contact   Have you reviewed Ongoing medications on the Targeted Concomitant Medication form and updated the form as needed?   [x]  Yes   []  No   Subject Status*   [x]  Continuing In Follow-up   []  At Risk For Lost To Follow-up   []  Withdrawal From All Future Study Activities Including Passive Follow-up By Electronic Health Record Review Or Contact With Healthcare Provider Or Family Member/Friend   []  Death   Vital Status*   [x]  Alive   []  Deceased   []  Unknown   Last Known To Be Alive Source*   [x]  Subject Completed Follow-up Questionnaire/Seen In Person/Via Telephone Contact   []  Family Member or Caretaker   []  Primary Physician Or Medical Records   []  Publicly Available Source   []  Other  Date of last dose taken   01-Jan-2023  Over the last 12 weeks did the subject miss any doses?n/a  Over the last 12 weeks did the subject restart Evolocumab after an interruption?n/a

## 2023-01-30 ENCOUNTER — Other Ambulatory Visit: Payer: Self-pay | Admitting: Nurse Practitioner

## 2023-01-30 ENCOUNTER — Other Ambulatory Visit: Payer: Self-pay | Admitting: Interventional Cardiology

## 2023-02-02 ENCOUNTER — Other Ambulatory Visit: Payer: Self-pay | Admitting: Nurse Practitioner

## 2023-02-03 ENCOUNTER — Other Ambulatory Visit: Payer: Self-pay | Admitting: Nurse Practitioner

## 2023-02-04 ENCOUNTER — Other Ambulatory Visit: Payer: Self-pay | Admitting: Nurse Practitioner

## 2023-02-12 ENCOUNTER — Other Ambulatory Visit: Payer: Self-pay | Admitting: *Deleted

## 2023-02-12 ENCOUNTER — Telehealth: Payer: Self-pay | Admitting: Nurse Practitioner

## 2023-02-12 MED ORDER — AMLODIPINE BESYLATE 5 MG PO TABS: 5.0000 mg | ORAL_TABLET | Freq: Every day | ORAL | 0 refills | Status: DC

## 2023-02-12 NOTE — Telephone Encounter (Signed)
*  STAT* If patient is at the pharmacy, call can be transferred to refill team.   1. Which medications need to be refilled? (please list name of each medication and dose if known)   amLODipine (NORVASC) 5 MG tablet (Expired)  sacubitril-valsartan (ENTRESTO) 24-26 MG   2. Would you like to learn more about the convenience, safety, & potential cost savings by using the [facility]?   3. Are you open to using the [facility] (Type [facility]. ).  4. Which pharmacy/location (including street and city if local pharmacy) is medication to be sent to?  [id number] - [place], [place] - [address]   5. Do they need a 30 day or 90 day supply?   30 day  Patient stated she is out of these medications.

## 2023-02-16 MED ORDER — ENTRESTO 24-26 MG PO TABS: 1.0000 | ORAL_TABLET | Freq: Two times a day (BID) | ORAL | 0 refills | Status: DC

## 2023-02-16 NOTE — Telephone Encounter (Signed)
Spoke with patient, appointment made with Dr. Jenene Slicker for 02/19/23, sent in 1 month refill for entresto 24/26 BID. Patient made aware she needs to keep appointment prior to further refills.

## 2023-02-16 NOTE — Telephone Encounter (Signed)
Pt c/o medication issue:  1. Name of Medication:   sacubitril-valsartan (ENTRESTO) 24-26 MG [[id number]]     2. How are you currently taking this medication (dosage and times per day)?   As prescribed  3. Are you having a reaction (difficulty breathing--STAT)?   No  4. What is your medication issue?   Patient stated she has now run out of this medication.  Patient wants a call back to discuss getting this medication refilled.

## 2023-02-17 ENCOUNTER — Other Ambulatory Visit: Payer: Self-pay | Admitting: Nurse Practitioner

## 2023-02-19 ENCOUNTER — Ambulatory Visit: Payer: 59 | Admitting: Internal Medicine

## 2023-03-11 ENCOUNTER — Other Ambulatory Visit (HOSPITAL_COMMUNITY): Payer: Self-pay | Admitting: Psychiatry

## 2023-03-17 ENCOUNTER — Encounter: Payer: Self-pay | Admitting: *Deleted

## 2023-03-17 ENCOUNTER — Other Ambulatory Visit: Payer: Self-pay

## 2023-03-17 DIAGNOSIS — Z006 Encounter for examination for normal comparison and control in clinical research program: Secondary | ICD-10-CM

## 2023-03-28 ENCOUNTER — Other Ambulatory Visit: Payer: Self-pay | Admitting: Nurse Practitioner

## 2023-03-31 ENCOUNTER — Ambulatory Visit: Payer: 59 | Admitting: Internal Medicine

## 2023-04-08 ENCOUNTER — Telehealth (INDEPENDENT_AMBULATORY_CARE_PROVIDER_SITE_OTHER): Payer: 59 | Admitting: Psychiatry

## 2023-04-08 ENCOUNTER — Other Ambulatory Visit: Payer: Self-pay | Admitting: Nurse Practitioner

## 2023-04-08 ENCOUNTER — Encounter (HOSPITAL_COMMUNITY): Payer: Self-pay | Admitting: Psychiatry

## 2023-04-08 DIAGNOSIS — F411 Generalized anxiety disorder: Secondary | ICD-10-CM

## 2023-04-08 MED ORDER — BUPROPION HCL ER (XL) 300 MG PO TB24: 300.0000 mg | ORAL_TABLET | ORAL | 2 refills | Status: DC

## 2023-04-08 MED ORDER — ALPRAZOLAM 1 MG PO TABS: 1.0000 mg | ORAL_TABLET | Freq: Three times a day (TID) | ORAL | 2 refills | Status: DC | PRN

## 2023-04-08 MED ORDER — BUSPIRONE HCL 10 MG PO TABS: 10.0000 mg | ORAL_TABLET | Freq: Three times a day (TID) | ORAL | 1 refills | Status: DC

## 2023-04-08 MED ORDER — TRAZODONE HCL 100 MG PO TABS: 100.0000 mg | ORAL_TABLET | Freq: Every day | ORAL | 2 refills | Status: DC

## 2023-04-08 NOTE — Progress Notes (Signed)
Virtual Visit via Video Note  I connected with Jean Nelson on 04/08/23 at  9:40 AM EST by a video enabled telemedicine application and verified that I am speaking with the correct person using two identifiers.  Location: Patient: home Provider: office   I discussed the limitations of evaluation and management by telemedicine and the availability of in person appointments. The patient expressed understanding and agreed to proceed.      I discussed the assessment and treatment plan with the patient. The patient was provided an opportunity to ask questions and all were answered. The patient agreed with the plan and demonstrated an understanding of the instructions.   The patient was advised to call back or seek an in-person evaluation if the symptoms worsen or if the condition fails to improve as anticipated.  I provided 15 minutes of non-face-to-face time during this encounter.   [name], MD  [facility] MD/PA/NP OP Progress Note  04/08/2023 9:52 AM Jean Nelson  MRN:  [id number]  Chief Complaint:  Chief Complaint  Patient presents with   Depression   Anxiety   Follow-up   HPI: This patient is a 57 year old female who lives with her 2 grandchildren in [place].  She is unemployed.  The patient returns for follow-up after 3 months regarding her depression and anxiety.  Overall the patient states she is doing okay.  She has a bad cold right now.  She does not like the cold weather and she often goes to bed very early because it gets dark so early.  She states that her mood is stable and she denies significant depression or anxiety.  We had tried Chantix to help her quit smoking but it did not really help.  She states that at the first of the year she is going to go back on the NicoDerm patches.  She is still smoking a little bit under a pack a day.  She has had no new cardiology symptoms.  She denies thoughts of self-harm or suicide. Visit Diagnosis:    ICD-10-CM   1.  Generalized anxiety disorder  F41.1       Past Psychiatric History: none  Past Medical History:  Past Medical History:  Diagnosis Date   Anxiety    Chronic back pain    Essential hypertension    Heart attack (HCC)    Plantar fasciitis    Sciatica     Past Surgical History:  Procedure Laterality Date   APPENDECTOMY     CARDIAC CATHETERIZATION     CORONARY/GRAFT ACUTE MI REVASCULARIZATION N/A 01/08/2022   Procedure: Coronary/Graft Acute MI Revascularization;  Surgeon: Corky Crafts, MD;  Location[facility] INVASIVE CV LAB;  Service: Cardiovascular;  Laterality: N/A;   LEFT HEART CATH AND CORONARY ANGIOGRAPHY N/A 01/08/2022   Procedure: LEFT HEART CATH AND CORONARY ANGIOGRAPHY;  Surgeon: Corky Crafts, MD;  Location: [facility] INVASIVE CV LAB;  Service: Cardiovascular;  Laterality: N/A;   TUBAL LIGATION      Family Psychiatric History: See below  Family History:  Family History  Problem Relation Age of Onset   ADD / ADHD Mother    Hypertension Mother    Alcohol abuse Maternal Uncle    Depression Maternal Grandfather    Alcohol abuse Maternal Grandfather     Social History:  Social History   Socioeconomic History   Marital status: Married    Spouse name: Not on file   Number of children: Not on file   Years of education: Not on  file   Highest education level: Not on file  Occupational History   Not on file  Tobacco Use   Smoking status: Every Day    Current packs/day: 0.50    Types: Cigarettes   Smokeless tobacco: Never   Tobacco comments:    1/2 PPD PER PATIENT 06/29/22  Vaping Use   Vaping status: Never Used  Substance and Sexual Activity   Alcohol use: Yes    Comment: occasionally    Drug use: No   Sexual activity: Yes    Birth control/protection: Surgical  Other Topics Concern   Not on file  Social History Narrative   Not on file   Social Determinants of Health   Financial Resource Strain: High Risk (05/20/2022)   Received from [employer], [facility]   Overall Financial Resource Strain (CARDIA)    Difficulty of Paying Living Expenses: Hard  Food Insecurity: Food Insecurity Present (05/20/2022)   Received from [facility], [facility]   Hunger Vital Sign    Worried About Running Out of Food in the Last Year: Sometimes true    Ran Out of Food in the Last Year: Sometimes true  Transportation Needs: Unmet Transportation Needs (05/20/2022)   [facility], [facility]   PRAPARE - Transportation    Lack of Transportation (Medical): Yes    Lack of Transportation (Non-Medical): Patient declined  Physical Activity: Unknown (05/20/2022)   Received from [facility], [facility]   Exercise Vital Sign    Days of Exercise per Week: Patient declined    Minutes of Exercise per Session: Not on file  Stress: Stress Concern Present (05/20/2022)   Received from [facility], [facility]   [employer] of Occupational Health - Occupational Stress Questionnaire    Feeling of Stress : Rather much  Social Connections: Unknown (11/07/2022)   Received from [facility]   Social Network    Social Network: Not on file    Allergies:  Allergies  Allergen Reactions   Sulfa Antibiotics Swelling   Codeine Hives    Metabolic Disorder Labs: Lab Results  Component Value Date   HGBA1C 5.6 01/08/2022   MPG 114.02 01/08/2022   MPG 114.02 01/08/2022   No results found for: "PROLACTIN" Lab Results  Component Value Date   CHOL 115 01/08/2022   TRIG 159 (H) 01/08/2022   HDL 34 (L) 01/08/2022   CHOLHDL 3.4 01/08/2022   VLDL 32 01/08/2022   LDLCALC 49 01/08/2022   LDLCALC 34 01/08/2022   No results found for: "TSH"  Therapeutic Level Labs: No results found for: "LITHIUM" No results found for: "VALPROATE" No results found for: "CBMZ"  Current Medications: Current Outpatient Medications  Medication Sig Dispense Refill   albuterol (PROVENTIL HFA;VENTOLIN HFA) 108 (90 BASE) MCG/ACT inhaler Inhale 1-2 puffs into the  lungs every 6 (six) hours as needed for wheezing (Use the inhaler for the next 4 days 4 times a day then begin using only if needed for wheezing). 1 Inhaler 0   ALPRAZolam (XANAX) 1 MG tablet Take 1 tablet (1 mg total) by mouth 3 (three) times daily as needed for anxiety. 90 tablet 2   amLODipine (NORVASC) 5 MG tablet Take 1 tablet (5 mg total) by mouth daily. 30 tablet 1   aspirin EC 81 MG tablet Take 81 mg by mouth daily.     Budeson-Glycopyrrol-Formoterol (BREZTRI AEROSPHERE) 160-9-4.8 MCG/ACT AERO Inhale 2 puffs into the lungs 2 (two) times daily. 10.7 g 11   buPROPion (WELLBUTRIN XL)  300 MG 24 hr tablet Take 1 tablet (300 mg total) by mouth every morning. 90 tablet 2   busPIRone (BUSPAR) 10 MG tablet Take 1 tablet (10 mg total) by mouth 3 (three) times daily. 270 tablet 1   cholecalciferol (VITAMIN D3) 25 MCG (1000 UNIT) tablet Take 1,000 Units by mouth daily.     Evolocumab (REPATHA SURECLICK) 140 MG/ML SOAJ Inject 140 mg into the skin every 14 (fourteen) days. For Investigational Use Only. Inject subcutaneously into abdomen, thigh, or upper arm every 14 days. Rotate injection sites and do not inject into areas where skin is tender, bruised, or red. Please contact [place] [facility] for any questions or concerns regarding this medication. 12 mL 2   metoprolol tartrate (LOPRESSOR) 25 MG tablet Take 25 mg by mouth 2 (two) times daily.     nicotine (NICODERM CQ - DOSED IN MG/24 HR) 7 mg/24hr patch Place 1 patch (7 mg total) onto the skin daily. 28 patch 0   nitroGLYCERIN (NITROSTAT) 0.4 MG SL tablet Place 1 tablet (0.4 mg total) under the tongue every 5 (five) minutes x 3 doses as needed for chest pain. 25 tablet 2   prasugrel (EFFIENT) 10 MG TABS tablet Take 1 tablet (10 mg total) by mouth daily. 30 tablet 1   rosuvastatin (CRESTOR) 40 MG tablet TAKE 1 TABLET BY MOUTH EVERY DAY 30 tablet 1   sacubitril-valsartan (ENTRESTO) 24-26 MG Take 1 tablet by mouth 2 (two) times daily. 60 tablet 0    traZODone (DESYREL) 100 MG tablet Take 1 tablet (100 mg total) by mouth at bedtime. 30 tablet 2   vitamin B-12 (CYANOCOBALAMIN) 500 MCG tablet Take 500 mcg by mouth daily.     No current facility-administered medications for this visit.     Musculoskeletal: Strength & Muscle Tone: within normal limits Gait & Station: normal Patient leans: N/A  Psychiatric Specialty Exam: Review of Systems  HENT:  Positive for congestion.   Respiratory:  Positive for cough.   All other systems reviewed and are negative.   Last menstrual period 05/25/2016.There is no height or weight on file to calculate BMI.  General Appearance: Casual and Fairly Groomed  Eye Contact:  Good  Speech:  Clear and Coherent  Volume:  Normal  Mood:  Euthymic  Affect:  Congruent  Thought Process:  Goal Directed  Orientation:  Full (Time, Place, and Person)  Thought Content: WDL   Suicidal Thoughts:  No  Homicidal Thoughts:  No  Memory:  Immediate;   Good Recent;   Good Remote;   Good  Judgement:  Good  Insight:  Fair  Psychomotor Activity:  Normal  Concentration:  Concentration: Good and Attention Span: Good  Recall:  Good  Fund of Knowledge: Good  Language: Good  Akathisia:  No  Handed:  Right  AIMS (if indicated): not done  Assets:  Communication Skills Desire for Improvement Resilience Social Support  ADL's:  Intact  Cognition: WNL  Sleep:  Good   Screenings: PHQ2-9    Flowsheet Row Video Visit from 01/06/2022 in [facility] at [place] Video Visit from 10/06/2021 in [facility] [place] Video Visit from 07/02/2021 in [facility] [place] Video Visit from 12/31/2020 in [facility] Total Score 0 1 0 1      Flowsheet Row ED to Hosp-Admission (Discharged) from 01/08/2022 in [place] 2H CARDIOVASCULAR ICU Video Visit from 01/06/2022 in [facility]  Outpatient  Behavioral Health at [facility] Video Visit from 10/06/2021 in [facility] Outpatient Behavioral Health at [place]  C-SSRS RISK CATEGORY No Risk No Risk No Risk        Assessment and Plan: This patient is a 57 year old female with a history of ADHD depression anxiety.  She is no longer taking medication for ADHD because of her cardiac problems.  She continues to do well on her current regimen.  She will continue Wellbutrin XL 300 mg daily for depression, BuSpar 10 mg 3 times daily in addition to Xanax 1 mg 3 times daily as needed for anxiety and trazodone 100 mg at bedtime for sleep.  She will return to see me in 3 months  Collaboration of Care: Collaboration of Care: Other provider involved in patient's care AEB notes are shared with cardiology on the epic system  Patient/Guardian was advised Release of Information must be obtained prior to any record release in order to collaborate their care with an outside provider. Patient/Guardian was advised if they have not already done so to contact the registration department to sign all necessary forms in order for [place] to release information regarding their care.   Consent: Patient/Guardian gives verbal consent for treatment and assignment of benefits for services provided during this visit. Patient/Guardian expressed understanding and agreed to proceed.    [name], MD 04/08/2023, 9:52 AM

## 2023-04-08 NOTE — Research (Signed)
60 Week Follow-Up Visit Completed*   []  Not Necessary, No Potential Adverse Events Or Medication Issues Reported On Completed Subject Questionnaire   []  Yes, Contact With Subject/Alternate Contact Completed   [x]  Yes, No Contact With Subject/Alternate Contact Completed, But Electronic Health Record Was Reviewed   []  No, Unable To Contact Subject/Alternate Contact   Have you reviewed Ongoing medications on the Targeted Concomitant Medication form and updated the form as needed?   [x]  Yes   []  No   Subject Status*   [x]  Continuing In Follow-up   []  At Risk For Lost To Follow-up   []  Withdrawal From All Future Study Activities Including Passive Follow-up By Electronic Health Record Review Or Contact With Healthcare Provider Or Family Member/Friend   []  Death   Vital Status*   [x]  Alive   []  Deceased   []  Unknown   Last Known To Be Alive Source*   []  Subject Completed Follow-up Questionnaire/Seen In Person/Via Telephone Contact   []  Family Member or Caretaker   [x]  Primary Physician Or Medical Records   []  Publicly Available Source   []  Other  Date of last dose taken   DAY/MONTH/YEAR  Over the last 12 weeks did the subject miss any doses?  Over the last 12 weeks did the subject restart Evolocumab after an interruption?

## 2023-04-13 ENCOUNTER — Other Ambulatory Visit: Payer: Self-pay | Admitting: Nurse Practitioner

## 2023-05-06 ENCOUNTER — Other Ambulatory Visit: Payer: Self-pay | Admitting: Nurse Practitioner

## 2023-05-17 ENCOUNTER — Other Ambulatory Visit: Payer: Self-pay

## 2023-05-17 ENCOUNTER — Other Ambulatory Visit (HOSPITAL_COMMUNITY): Payer: Self-pay

## 2023-05-17 ENCOUNTER — Other Ambulatory Visit (HOSPITAL_COMMUNITY): Payer: Self-pay | Admitting: Pharmacy Technician

## 2023-05-17 NOTE — Progress Notes (Signed)
 Specialty Pharmacy Refill Coordination Note  Jean Nelson is a 58 y.o. female contacted today regarding refills of specialty medication(s) Evolocumab  (Repatha  SureClick)   Patient requested Delivery   Delivery date: 06/02/23   Verified address: [address]  [place]   Medication will be filled on 06/01/23.

## 2023-06-01 ENCOUNTER — Other Ambulatory Visit: Payer: Self-pay

## 2023-06-03 ENCOUNTER — Other Ambulatory Visit: Payer: Self-pay | Admitting: Nurse Practitioner

## 2023-06-09 ENCOUNTER — Other Ambulatory Visit: Payer: Self-pay | Admitting: Nurse Practitioner

## 2023-06-10 NOTE — Telephone Encounter (Signed)
 This is a [place] pt.

## 2023-06-10 NOTE — Telephone Encounter (Signed)
Refill for 30 days sent to pharmacy.   

## 2023-06-10 NOTE — Telephone Encounter (Signed)
 Pt has an upcoming appt in the [place] office with [place] and pt lives in [place] please address

## 2023-06-14 ENCOUNTER — Ambulatory Visit: Payer: 59 | Admitting: Physician Assistant

## 2023-06-15 NOTE — Progress Notes (Deleted)
 Cardiology Office Note    Date:  06/15/2023  ID:  Jean Nelson, DOB 1966/03/07, MRN [id number] Cardiologist: Lance Muss, MD    History of Present Illness:    Jean Nelson is a 58 y.o. 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  She was last examined by Elyse Jarvis, NP in 01/2022 following her recent hospitalization and reported occasional episodes of sharp chest pain but no persistent symptoms. Was continued on DAPT with Effient and ASA (had been on Effient due to cost). For her cardiomyopathy, she had been on Lisinopril and this was discontinued and transitioned to Entresto 24-26 mg twice daily and she was continued on Amlodipine, Repatha, Lopressor and Crestor.  She has canceled or no showed for multiple office visits in the interim and has not been evaluated by Cardiology since 01/2022.  - echo - labs --> CBC, CMET, FLP  ROS: ***  Studies Reviewed:   EKG: EKG is*** ordered today and demonstrates ***   EKG Interpretation Date/Time:    Ventricular Rate:    PR Interval:    QRS Duration:    QT Interval:    QTC Calculation:   R Axis:      Text Interpretation:         Cardiac Catheterization: 01/2022   Dist LAD-1 lesion is 100% stenosed.  Balloon angioplasty was performed using a BALLN SAPPHIRE 2.0X15.   Post intervention, there is a 0% residual stenosis.   Dist LAD-2 lesion is 100% stenosed.   Prox RCA lesion is 100% stenosed.  Brisk right to right and left to right collaterals.   There is mild to moderate left ventricular systolic dysfunction.  Apical  hypokinesis related to the distal LAD lesion.   LV end diastolic pressure is normal.   The left ventricular ejection fraction is 45-50% by visual estimate.   There is no aortic valve stenosis.   Significant two-vessel coronary artery disease including chronic total occlusion of the proximal RCA.     Culprit vessel was the occluded distal LAD which was successfully opened with balloon angioplasty.  The vessel was too small for stenting.  After flow was improved, there did appear to be embolization further down on the vessel.  This was too distal for even a balloon angioplasty.  Patient was therapeutic on heparin.  We are initiating Brilinta antiplatelet therapy for stronger effect.  Will initiate high-dose statin.   I suspect her ECG changes were due to the apical LAD occlusion compromising collaterals to the RCA.   She needs aggressive medical therapy and lifestyle modification including smoking cessation.    Echocardiogram: 01/2022 IMPRESSIONS     1. Apical akinesis with overall mild LV dysfunction.   2. Left ventricular ejection fraction, by estimation, is 45 to 50%. The  left ventricle has mildly decreased function. The left ventricle  demonstrates regional wall motion abnormalities (see scoring  diagram/findings for description). There is moderate  left ventricular hypertrophy. Left ventricular diastolic parameters are  consistent with Grade II diastolic dysfunction (pseudonormalization).  Elevated left atrial pressure.   3. Right ventricular systolic function is normal. The right ventricular  size is normal.   4. The mitral valve is normal in structure. Trivial mitral valve  regurgitation. No evidence of mitral stenosis.   5. The aortic valve has an indeterminant number of cusps. Aortic valve  regurgitation is not visualized. No aortic stenosis is present.   6. The inferior vena cava is normal in size with greater than 50%  respiratory variability, suggesting right atrial  pressure of 3 mmHg.   Comparison(s): No prior Echocardiogram.    Risk Assessment/Calculations:   {Does this patient have ATRIAL FIBRILLATION?[phone]} No BP recorded.  {Refresh Note OR Click here to enter BP  :1}***         Physical Exam:   VS:  LMP 05/25/2016    Wt Readings from Last 3 Encounters:  06/29/22 195 lb 3.2 oz (88.5 kg)  01/21/22 182 lb (82.6 kg)  01/09/22 175 lb 6.4 oz (79.6 kg)     GEN: Well nourished, well developed in no acute distress NECK: No JVD; No carotid bruits CARDIAC: ***RRR, no murmurs, rubs, gallops RESPIRATORY:  Clear to auscultation without rales, wheezing or rhonchi  ABDOMEN: Appears non-distended. No obvious abdominal masses. EXTREMITIES: No clubbing or cyanosis. No edema.  Distal pedal pulses are 2+ bilaterally.   Assessment and Plan:   1. CAD - She has a known CTO of RCA in 2021 and did have a STEMI in 01/2022 with cardiac catheterization showing CTO of proximal RCA and occluded distal LAD which was successfully opened with balloon angioplasty and too small for stenting. ***  2. Chronic HFmrEF - EF was at 45 to 50% by echocardiogram in 01/2022. ***  3. HTN - ***  4. HLD - ***    Signed, Ellsworth Lennox, PA-C

## 2023-06-16 ENCOUNTER — Ambulatory Visit: Payer: 59 | Admitting: Student

## 2023-06-28 ENCOUNTER — Encounter: Payer: Self-pay | Admitting: *Deleted

## 2023-06-28 DIAGNOSIS — Z006 Encounter for examination for normal comparison and control in clinical research program: Secondary | ICD-10-CM

## 2023-06-28 NOTE — Research (Signed)
 72 Week Follow-Up Visit Completed*   []  Not Necessary, No Potential Adverse Events Or Medication Issues Reported On Completed Subject Questionnaire   [x]  Yes, Contact With Subject/Alternate Contact Completed   []  Yes, No Contact With Subject/Alternate Contact Completed, But Electronic Health Record Was Reviewed   []  No, Unable To Contact Subject/Alternate Contact   Have you reviewed Ongoing medications on the Targeted Concomitant Medication form and updated the form as needed?   [x]  Yes   []  No   Subject Status*   [x]  Continuing In Follow-up   []  At Risk For Lost To Follow-up   []  Withdrawal From All Future Study Activities Including Passive Follow-up By Electronic Health Record Review Or Contact With Healthcare Provider Or Family Member/Friend   []  Death   Vital Status*   [x]  Alive   []  Deceased   []  Unknown   Last Known To Be Alive Source*   [x]  Subject Completed Follow-up Questionnaire/Seen In Person/Via Telephone Contact   []  Family Member or Caretaker   []  Primary Physician Or Medical Records   []  Publicly Available Source   []  Other  Date of last dose taken   16/Feb/2025  Over the last 12 weeks did the subject miss any doses? No  Over the last 12 weeks did the subject restart Evolocumab after an interruption? No

## 2023-06-30 ENCOUNTER — Encounter (HOSPITAL_COMMUNITY): Payer: Self-pay | Admitting: Psychiatry

## 2023-06-30 ENCOUNTER — Telehealth (INDEPENDENT_AMBULATORY_CARE_PROVIDER_SITE_OTHER): Payer: 59 | Admitting: Psychiatry

## 2023-06-30 ENCOUNTER — Other Ambulatory Visit: Payer: Self-pay | Admitting: Nurse Practitioner

## 2023-06-30 ENCOUNTER — Other Ambulatory Visit: Payer: Self-pay | Admitting: Cardiology

## 2023-06-30 DIAGNOSIS — F411 Generalized anxiety disorder: Secondary | ICD-10-CM | POA: Diagnosis not present

## 2023-06-30 DIAGNOSIS — I2102 ST elevation (STEMI) myocardial infarction involving left anterior descending coronary artery: Secondary | ICD-10-CM

## 2023-06-30 DIAGNOSIS — I2510 Atherosclerotic heart disease of native coronary artery without angina pectoris: Secondary | ICD-10-CM

## 2023-06-30 DIAGNOSIS — E785 Hyperlipidemia, unspecified: Secondary | ICD-10-CM

## 2023-06-30 MED ORDER — BUSPIRONE HCL 10 MG PO TABS: 10.0000 mg | ORAL_TABLET | Freq: Three times a day (TID) | ORAL | 1 refills | Status: DC

## 2023-06-30 MED ORDER — TRAZODONE HCL 100 MG PO TABS: 100.0000 mg | ORAL_TABLET | Freq: Every day | ORAL | 2 refills | Status: DC

## 2023-06-30 MED ORDER — ALPRAZOLAM 1 MG PO TABS: 1.0000 mg | ORAL_TABLET | Freq: Three times a day (TID) | ORAL | 2 refills | Status: DC | PRN

## 2023-06-30 MED ORDER — BUPROPION HCL ER (XL) 300 MG PO TB24: 300.0000 mg | ORAL_TABLET | ORAL | 2 refills | Status: DC

## 2023-06-30 NOTE — Progress Notes (Signed)
 Virtual Visit via Video Note  I connected with Jean Nelson on 06/30/23 at 11:20 AM EST by a video enabled telemedicine application and verified that I am speaking with the correct person using two identifiers.  Location: Patient: home Provider: office   I discussed the limitations of evaluation and management by telemedicine and the availability of in person appointments. The patient expressed understanding and agreed to proceed.     I discussed the assessment and treatment plan with the patient. The patient was provided an opportunity to ask questions and all were answered. The patient agreed with the plan and demonstrated an understanding of the instructions.   The patient was advised to call back or seek an in-person evaluation if the symptoms worsen or if the condition fails to improve as anticipated.  I provided 20 minutes of non-face-to-face time during this encounter.   [name], MD  [facility] MD/PA/NP OP Progress Note  06/30/2023 11:38 AM Jean Nelson  MRN:  [id number]  Chief Complaint:  Chief Complaint  Patient presents with   Anxiety   Depression   Follow-up   HPI: This patient is a 58 year old female who lives with her 2 grandchildren and her son in [place].  She just recently got a part-time job at [employer].  The patient returns for follow-up after 3 months regarding her depression and anxiety.  The patient states that she is feeling somewhat better.  She is working at [employer] part-time.  She enjoys getting out meeting new people.  She states that her mood is good and she denies significant depression or anxiety.  She is sleeping well.  She is continuing to try to cut down smoking and having success on some days and not on others.  She denies any thoughts of self-harm or suicide. Visit Diagnosis:    ICD-10-CM   1. Generalized anxiety disorder  F41.1       Past Psychiatric History: none  Past Medical History:  Past Medical History:   Diagnosis Date   Anxiety    Chronic back pain    Essential hypertension    Heart attack (HCC)    Plantar fasciitis    Sciatica     Past Surgical History:  Procedure Laterality Date   APPENDECTOMY     CARDIAC CATHETERIZATION     CORONARY/GRAFT ACUTE MI REVASCULARIZATION N/A 01/08/2022   Procedure: Coronary/Graft Acute MI Revascularization;  Surgeon: Corky Crafts, MD;  Location: [facility] INVASIVE CV LAB;  Service: Cardiovascular;  Laterality: N/A;   LEFT HEART CATH AND CORONARY ANGIOGRAPHY N/A 01/08/2022   Procedure: LEFT HEART CATH AND CORONARY ANGIOGRAPHY;  Surgeon: Corky Crafts, MD;  Location: [facility] INVASIVE CV LAB;  Service: Cardiovascular;  Laterality: N/A;   TUBAL LIGATION      Family Psychiatric History: See below  Family History:  Family History  Problem Relation Age of Onset   ADD / ADHD Mother    Hypertension Mother    Alcohol abuse Maternal Uncle    Depression Maternal Grandfather    Alcohol abuse Maternal Grandfather     Social History:  Social History   Socioeconomic History   Marital status: Married    Spouse name: Not on file   Number of children: Not on file   Years of education: Not on file   Highest education level: Not on file  Occupational History   Not on file  Tobacco Use   Smoking status: Every Day    Current packs/day: 0.50  Types: Cigarettes   Smokeless tobacco: Never   Tobacco comments:    1/2 PPD PER PATIENT 06/29/22  Vaping Use   Vaping status: Never Used  Substance and Sexual Activity   Alcohol use: Yes    Comment: occasionally    Drug use: No   Sexual activity: Yes    Birth control/protection: Surgical  Other Topics Concern   Not on file  Social History Narrative   Not on file   Social Drivers of Health   Financial Resource Strain: High Risk (05/20/2022)   Received from [facility], [facility]   Overall Financial Resource Strain (CARDIA)    Difficulty of Paying Living Expenses: Hard  Food Insecurity: Food  Insecurity Present (05/20/2022)   Received from [facility], [facility]   Hunger Vital Sign    Worried About Running Out of Food in the Last Year: Sometimes true    Ran Out of Food in the Last Year: Sometimes true  Transportation Needs: Unmet Transportation Needs (05/20/2022)   Received from [facility], [facility]   PRAPARE - Transportation    Lack of Transportation (Medical): Yes    Lack of Transportation (Non-Medical): Patient declined  Physical Activity: Unknown (05/20/2022)   Received from [facility], [facility]   Exercise Vital Sign    Days of Exercise per Week: Patient declined    Minutes of Exercise per Session: Not on file  Stress: Stress Concern Present (05/20/2022)   Received from [facility], [facility]   [employer] of Occupational Health - Occupational Stress Questionnaire    Feeling of Stress : Rather much  Social Connections: Unknown (11/07/2022)   Received from [facility]   Social Network    Social Network: Not on file    Allergies:  Allergies  Allergen Reactions   Sulfa Antibiotics Swelling   Codeine Hives    Metabolic Disorder Labs: Lab Results  Component Value Date   HGBA1C 5.6 01/08/2022   MPG 114.02 01/08/2022   MPG 114.02 01/08/2022   No results found for: "PROLACTIN" Lab Results  Component Value Date   CHOL 115 01/08/2022   TRIG 159 (H) 01/08/2022   HDL 34 (L) 01/08/2022   CHOLHDL 3.4 01/08/2022   VLDL 32 01/08/2022   LDLCALC 49 01/08/2022   LDLCALC 34 01/08/2022   No results found for: "TSH"  Therapeutic Level Labs: No results found for: "LITHIUM" No results found for: "VALPROATE" No results found for: "CBMZ"  Current Medications: Current Outpatient Medications  Medication Sig Dispense Refill   albuterol (PROVENTIL HFA;VENTOLIN HFA) 108 (90 BASE) MCG/ACT inhaler Inhale 1-2 puffs into the lungs every 6 (six) hours as needed for wheezing (Use the inhaler for the next 4 days 4 times a day then begin using only  if needed for wheezing). 1 Inhaler 0   ALPRAZolam (XANAX) 1 MG tablet Take 1 tablet (1 mg total) by mouth 3 (three) times daily as needed for anxiety. 90 tablet 2   amLODipine (NORVASC) 5 MG tablet TAKE 1 TABLET (5 MG TOTAL) BY MOUTH DAILY. 30 tablet 0   aspirin EC 81 MG tablet Take 81 mg by mouth daily.     Budeson-Glycopyrrol-Formoterol (BREZTRI AEROSPHERE) 160-9-4.8 MCG/ACT AERO Inhale 2 puffs into the lungs 2 (two) times daily. 10.7 g 11   buPROPion (WELLBUTRIN XL) 300 MG 24 hr tablet Take 1 tablet (300 mg total) by mouth every morning. 90 tablet 2   busPIRone (BUSPAR) 10 MG tablet Take 1 tablet (10 mg total) by mouth 3 (three)  times daily. 270 tablet 1   cholecalciferol (VITAMIN D3) 25 MCG (1000 UNIT) tablet Take 1,000 Units by mouth daily.     Evolocumab (REPATHA SURECLICK) 140 MG/ML SOAJ Inject 140 mg into the skin every 14 (fourteen) days. For Investigational Use Only. Inject subcutaneously into abdomen, thigh, or upper arm every 14 days. Rotate injection sites and do not inject into areas where skin is tender, bruised, or red. Please contact [facility] for any questions or concerns regarding this medication. 12 mL 2   metoprolol tartrate (LOPRESSOR) 25 MG tablet Take 25 mg by mouth 2 (two) times daily.     nicotine (NICODERM CQ - DOSED IN MG/24 HR) 7 mg/24hr patch Place 1 patch (7 mg total) onto the skin daily. 28 patch 0   nitroGLYCERIN (NITROSTAT) 0.4 MG SL tablet Place 1 tablet (0.4 mg total) under the tongue every 5 (five) minutes x 3 doses as needed for chest pain. 25 tablet 2   prasugrel (EFFIENT) 10 MG TABS tablet TAKE 1 TABLET BY MOUTH EVERY DAY 30 tablet 0   rosuvastatin (CRESTOR) 40 MG tablet TAKE 1 TABLET BY MOUTH EVERY DAY 30 tablet 0   sacubitril-valsartan (ENTRESTO) 24-26 MG TAKE 1 TABLET BY MOUTH TWICE A DAY 60 tablet 0   traZODone (DESYREL) 100 MG tablet Take 1 tablet (100 mg total) by mouth at bedtime. 30 tablet 2   vitamin B-12 (CYANOCOBALAMIN) 500 MCG  tablet Take 500 mcg by mouth daily.     No current facility-administered medications for this visit.     Musculoskeletal: Strength & Muscle Tone: within normal limits Gait & Station: normal Patient leans: N/A  Psychiatric Specialty Exam: Review of Systems  All other systems reviewed and are negative.   Last menstrual period 05/25/2016.There is no height or weight on file to calculate BMI.  General Appearance: Casual and Fairly Groomed  Eye Contact:  Good  Speech:  Clear and Coherent  Volume:  Normal  Mood:  Euthymic  Affect:  Congruent  Thought Process:  Goal Directed  Orientation:  Full (Time, Place, and Person)  Thought Content: WDL   Suicidal Thoughts:  No  Homicidal Thoughts:  No  Memory:  Immediate;   Good Recent;   Good Remote;   Good  Judgement:  Good  Insight:  Fair  Psychomotor Activity:  Normal  Concentration:  Concentration: Fair and Attention Span: Fair  Recall:  Good  Fund of Knowledge: Good  Language: Good  Akathisia:  No  Handed:  Right  AIMS (if indicated): not done  Assets:  Communication Skills Desire for Improvement Resilience Social Support Talents/Skills  ADL's:  Intact  Cognition: WNL  Sleep:  Good   Screenings: PHQ2-9    Flowsheet Row Video Visit from 01/06/2022 in [facility] [place] Video Visit from 10/06/2021 in [facility] [place] Video Visit from 07/02/2021 in [facility] at [place] Video Visit from 12/31/2020 in [facility] at [facility] Total Score 0 1 0 1      Flowsheet Row ED to Hosp-Admission (Discharged) from 01/08/2022 in [place] [facility] CARDIOVASCULAR ICU Video Visit from 01/06/2022 in [facility] [place] Video Visit from 10/06/2021 in [facility] at [place]  C-SSRS RISK CATEGORY No Risk No Risk No Risk        Assessment and Plan:  This patient is a 58 year old female with a history of ADHD depression and  anxiety.  She is no longer taking ADHD because of her cardiac problems but states that she focuses well enough to function at her job.  She is doing well in terms of her current regimen for depression and anxiety.  She will continue Wellbutrin XL 300 mg daily for depression, BuSpar 10 mg 3 times daily in addition to Xanax 1 mg 3 times daily as needed for anxiety and trazodone 100 mg at bedtime for sleep.  She will return to see me in 3 months  Collaboration of Care: Collaboration of Care: Other provider involved in patient's care AEB notes are shared with cardiology on the epic system  Patient/Guardian was advised Release of Information must be obtained prior to any record release in order to collaborate their care with an outside provider. Patient/Guardian was advised if they have not already done so to contact the registration department to sign all necessary forms in order for [place] to release information regarding their care.   Consent: Patient/Guardian gives verbal consent for treatment and assignment of benefits for services provided during this visit. Patient/Guardian expressed understanding and agreed to proceed.    [name], MD 06/30/2023, 11:38 AM

## 2023-07-01 ENCOUNTER — Telehealth: Payer: Self-pay | Admitting: Pharmacy Technician

## 2023-07-01 ENCOUNTER — Other Ambulatory Visit (HOSPITAL_COMMUNITY): Payer: Self-pay

## 2023-07-01 DIAGNOSIS — E785 Hyperlipidemia, unspecified: Secondary | ICD-10-CM

## 2023-07-01 NOTE — Telephone Encounter (Signed)
 Pharmacy Patient Advocate Encounter   Received notification from CoverMyMeds that prior authorization for Repatha is required/requested.   Insurance verification completed.   The patient is insured through [employer] .   Per test claim: PA required; However, NEW/RECENT labs/notes are needed to complete & submit PA request. Please see below.

## 2023-07-01 NOTE — Telephone Encounter (Signed)
 Pt has an upcoming appt in April 2025 with [place]. Please address

## 2023-07-01 NOTE — Addendum Note (Signed)
 Addended by: Tylene Fantasia on: 07/01/2023 10:51 AM   Modules accepted: Orders

## 2023-07-27 ENCOUNTER — Other Ambulatory Visit: Payer: Self-pay | Admitting: Medical

## 2023-08-02 ENCOUNTER — Other Ambulatory Visit: Payer: Self-pay | Admitting: Medical

## 2023-08-10 ENCOUNTER — Ambulatory Visit: Payer: 59 | Admitting: Student

## 2023-08-10 NOTE — Progress Notes (Deleted)
 Cardiology Office Note    Date:  08/10/2023  ID:  Jean Nelson, DOB [birth year], MRN [id number] Cardiologist: Lance Muss, MD    History of Present Illness:    Jean Nelson is a 58 y.o. 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   She was last examined by Elyse Jarvis, NP in 01/2022 following her recent hospitalization and reported occasional episodes of sharp chest pain but no persistent symptoms. Was continued on DAPT with Effient and ASA (had been on Effient due to cost). For her cardiomyopathy, she had been on Lisinopril and this was discontinued and transitioned to Entresto 24-26 mg twice daily and she was continued on Amlodipine, Repatha, Lopressor and Crestor.   She has canceled or no showed for multiple office visits in the interim and has not been evaluated by Cardiology since 01/2022.   - echo - labs --> CBC, CMET, FLP  ROS: ***  Studies Reviewed:   EKG: EKG is*** ordered today and demonstrates ***   EKG Interpretation Date/Time:    Ventricular Rate:    PR Interval:    QRS Duration:    QT Interval:    QTC Calculation:   R Axis:      Text Interpretation:         Cardiac Catheterization: 01/2022   Dist LAD-1 lesion is 100% stenosed.  Balloon angioplasty was performed using a BALLN SAPPHIRE 2.0X15.   Post intervention, there is a 0% residual stenosis.   Dist LAD-2 lesion is 100% stenosed.   Prox RCA lesion is 100% stenosed.  Brisk right to right and left to right collaterals.   There is mild to moderate left ventricular systolic dysfunction.  Apical  hypokinesis related to the distal LAD lesion.   LV end diastolic pressure is normal.   The left ventricular ejection fraction is 45-50% by visual estimate.   There is no aortic valve stenosis.   Significant two-vessel coronary artery disease including chronic total occlusion of the proximal RCA.     Culprit vessel was the occluded distal LAD which was successfully opened with balloon angioplasty.  The vessel was too small for stenting.  After flow was improved, there did appear to be embolization further down on the vessel.  This was too distal for even a balloon angioplasty.  Patient was therapeutic on heparin.  We are initiating Brilinta antiplatelet therapy for stronger effect.  Will initiate high-dose statin.   I suspect her ECG changes were due to the apical LAD occlusion compromising collaterals to the RCA.   She needs aggressive medical therapy and lifestyle modification including smoking cessation.     Echocardiogram: 01/2022 IMPRESSIONS     1. Apical akinesis with overall mild LV dysfunction.   2. Left ventricular ejection fraction, by estimation, is 45 to 50%. The  left ventricle has mildly decreased function. The left ventricle  demonstrates regional wall motion abnormalities (see scoring  diagram/findings for description). There is moderate  left ventricular hypertrophy. Left ventricular diastolic parameters are  consistent with Grade II diastolic dysfunction (pseudonormalization).  Elevated left atrial pressure.   3. Right ventricular systolic function is  normal. The right ventricular  size is normal.   4. The mitral valve is normal in structure. Trivial mitral valve  regurgitation. No evidence of mitral stenosis.   5. The aortic valve has an indeterminant number of cusps. Aortic valve  regurgitation is not visualized. No aortic stenosis is present.   6. The inferior vena cava is normal in size with greater than 50%  respiratory variability, suggesting right atrial  pressure of 3 mmHg.   Comparison(s): No prior Echocardiogram.   Risk Assessment/Calculations:   {Does this patient have ATRIAL FIBRILLATION?:[phone]} No BP recorded.  {Refresh Note OR Click here to enter BP  :1}***         Physical Exam:   VS:  LMP 05/25/2016    Wt Readings from Last 3 Encounters:  06/29/22 195 lb 3.2 oz (88.5 kg)  01/21/22 182 lb (82.6 kg)  01/09/22 175 lb 6.4 oz (79.6 kg)     GEN: Well nourished, well developed in no acute distress NECK: No JVD; No carotid bruits CARDIAC: ***RRR, no murmurs, rubs, gallops RESPIRATORY:  Clear to auscultation without rales, wheezing or rhonchi  ABDOMEN: Appears non-distended. No obvious abdominal masses. EXTREMITIES: No clubbing or cyanosis. No edema.  Distal pedal pulses are 2+ bilaterally.   Assessment and Plan:   1. CAD - She has a known CTO of RCA in 2021 and did have a STEMI in 01/2022 with cardiac catheterization showing CTO of proximal RCA and occluded distal LAD which was successfully opened with balloon angioplasty and too small for stenting. ***   2. Chronic HFmrEF - EF was at 45 to 50% by echocardiogram in 01/2022. ***   3. HTN - ***   4. HLD - ***    Signed, Ellsworth Lennox, PA-C

## 2023-09-02 ENCOUNTER — Other Ambulatory Visit: Payer: Self-pay | Admitting: Student

## 2023-09-07 ENCOUNTER — Other Ambulatory Visit: Payer: Self-pay | Admitting: Nurse Practitioner

## 2023-09-08 NOTE — Telephone Encounter (Signed)
 This pt has only been seen on 01/21/22 by Slater Duncan NP. Supposed to come back in 4 months. She doesn't have a Cardiologist to my knowledge as Dr. Jacquelynn Matter was her last. She has Cancelled or No-Showed numerous times. Does Slater Duncan NP want to refill? Please address.

## 2023-09-28 ENCOUNTER — Telehealth (INDEPENDENT_AMBULATORY_CARE_PROVIDER_SITE_OTHER): Payer: 59 | Admitting: Psychiatry

## 2023-09-28 ENCOUNTER — Encounter (HOSPITAL_COMMUNITY): Payer: Self-pay | Admitting: Psychiatry

## 2023-09-28 ENCOUNTER — Telehealth (HOSPITAL_COMMUNITY): Payer: Self-pay | Admitting: *Deleted

## 2023-09-28 DIAGNOSIS — F902 Attention-deficit hyperactivity disorder, combined type: Secondary | ICD-10-CM | POA: Diagnosis not present

## 2023-09-28 DIAGNOSIS — F411 Generalized anxiety disorder: Secondary | ICD-10-CM | POA: Diagnosis not present

## 2023-09-28 MED ORDER — TRAZODONE HCL 100 MG PO TABS: 100.0000 mg | ORAL_TABLET | Freq: Every day | ORAL | 2 refills | Status: DC

## 2023-09-28 MED ORDER — ALPRAZOLAM 1 MG PO TABS: 1.0000 mg | ORAL_TABLET | Freq: Three times a day (TID) | ORAL | 2 refills | Status: DC | PRN

## 2023-09-28 MED ORDER — BUPROPION HCL ER (XL) 300 MG PO TB24: 300.0000 mg | ORAL_TABLET | ORAL | 2 refills | Status: DC

## 2023-09-28 MED ORDER — BUSPIRONE HCL 10 MG PO TABS: 10.0000 mg | ORAL_TABLET | Freq: Three times a day (TID) | ORAL | 1 refills | Status: DC

## 2023-09-28 NOTE — Progress Notes (Signed)
 Virtual Visit via Video Note  I connected with Jean Nelson on 09/28/23 at  3:00 PM EDT by a video enabled telemedicine application and verified that I am speaking with the correct person using two identifiers.  Location: Patient: home Provider: office   I discussed the limitations of evaluation and management by telemedicine and the availability of in person appointments. The patient expressed understanding and agreed to proceed.     I discussed the assessment and treatment plan with the patient. The patient was provided an opportunity to ask questions and all were answered. The patient agreed with the plan and demonstrated an understanding of the instructions.   The patient was advised to call back or seek an in-person evaluation if the symptoms worsen or if the condition fails to improve as anticipated.  I provided 20 minutes of non-face-to-face time during this encounter.   [name], MD  [facility] MD/PA/NP OP Progress Note  09/28/2023 3:06 PM [name]  MRN:  [id number]  Chief Complaint:  Chief Complaint  Patient presents with   Anxiety   Depression   Follow-up   HPI: This patient is a 58 year old female lives with her 2 grandchildren and her son in [place].  She is working part-time at a [employer].  The patient returns for follow-up after 3 months regarding her depression and anxiety.  The patient states that overall she is doing well.  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  She states she generally likes it although sometimes it gets a little hectic.  She tends to rush to try to get to everything in time.  She states that her mood is good and she denies significant depression or anxiety.  She is sleeping well.  She denies any thoughts of self-harm or suicide Visit Diagnosis:    ICD-10-CM   1. Generalized anxiety disorder  F41.1     2. ADHD (attention deficit hyperactivity disorder), combined type  F90.2       Past Psychiatric  History: none  Past Medical History:  Past Medical History:  Diagnosis Date   Anxiety    Chronic back pain    Essential hypertension    Heart attack (HCC)    Plantar fasciitis    Sciatica     Past Surgical History:  Procedure Laterality Date   APPENDECTOMY     CARDIAC CATHETERIZATION     CORONARY/GRAFT ACUTE MI REVASCULARIZATION N/A 01/08/2022   Procedure: Coronary/Graft Acute MI Revascularization;  Surgeon: Lucendia Rusk, MD;  Location: [facility] INVASIVE CV LAB;  Service: Cardiovascular;  Laterality: N/A;   LEFT HEART CATH AND CORONARY ANGIOGRAPHY N/A 01/08/2022   Procedure: LEFT HEART CATH AND CORONARY ANGIOGRAPHY;  Surgeon: Lucendia Rusk, MD;  Location: [facility] INVASIVE CV LAB;  Service: Cardiovascular;  Laterality: N/A;   TUBAL LIGATION      Family Psychiatric History: See below  Family History:  Family History  Problem Relation Age of Onset   ADD / ADHD Mother    Hypertension Mother    Alcohol abuse Maternal Uncle    Depression Maternal Grandfather    Alcohol abuse Maternal Grandfather     Social History:  Social History   Socioeconomic History   Marital status: Married    Spouse name: Not on file   Number of children: Not on file   Years of education: Not on file   Highest education level: Not on file  Occupational History   Not on file  Tobacco  Use   Smoking status: Every Day    Current packs/day: 0.50    Types: Cigarettes   Smokeless tobacco: Never   Tobacco comments:    1/2 PPD PER PATIENT 06/29/22  Vaping Use   Vaping status: Never Used  Substance and Sexual Activity   Alcohol use: Yes    Comment: occasionally    Drug use: No   Sexual activity: Yes    Birth control/protection: Surgical  Other Topics Concern   Not on file  Social History Narrative   Not on file   Social Drivers of Health   Financial Resource Strain: High Risk (05/20/2022)   Received from [facility], [facility]   Overall Financial Resource Strain (CARDIA)    Difficulty  of Paying Living Expenses: Hard  Food Insecurity: Food Insecurity Present (05/20/2022)   Received from [facility], [facility]   Hunger Vital Sign    Worried About Running Out of Food in the Last Year: Sometimes true    Ran Out of Food in the Last Year: Sometimes true  Transportation Needs: Unmet Transportation Needs (05/20/2022)   Received from [facility], [facility]   PRAPARE - Transportation    Lack of Transportation (Medical): Yes    Lack of Transportation (Non-Medical): Patient declined  Physical Activity: Unknown (05/20/2022)   [facility]   Exercise Vital Sign    Days of Exercise per Week: Patient declined    Minutes of Exercise per Session: Not on file  Stress: Stress Concern Present (05/20/2022)   Received from [facility], [facility]   [employer] of Occupational Health - Occupational Stress Questionnaire    Feeling of Stress : Rather much  Social Connections: Unknown (11/07/2022)   [facility]   Social Network    Social Network: Not on file    Allergies:  Allergies  Allergen Reactions   Sulfa Antibiotics Swelling   Codeine Hives    Metabolic Disorder Labs: Lab Results  Component Value Date   HGBA1C 5.6 01/08/2022   MPG 114.02 01/08/2022   MPG 114.02 01/08/2022   No results found for: "PROLACTIN" Lab Results  Component Value Date   CHOL 115 01/08/2022   TRIG 159 (H) 01/08/2022   HDL 34 (L) 01/08/2022   CHOLHDL 3.4 01/08/2022   VLDL 32 01/08/2022   LDLCALC 49 01/08/2022   LDLCALC 34 01/08/2022   No results found for: "TSH"  Therapeutic Level Labs: No results found for: "LITHIUM" No results found for: "VALPROATE" No results found for: "CBMZ"  Current Medications: Current Outpatient Medications  Medication Sig Dispense Refill   albuterol  (PROVENTIL  HFA;VENTOLIN  HFA) 108 (90 BASE) MCG/ACT inhaler Inhale 1-2 puffs into the lungs every 6 (six) hours as needed for wheezing (Use the inhaler  for the next 4 days 4 times a day then begin using only if needed for wheezing). 1 Inhaler 0   ALPRAZolam  (XANAX ) 1 MG tablet Take 1 tablet (1 mg total) by mouth 3 (three) times daily as needed for anxiety. 90 tablet 2   amLODipine  (NORVASC ) 5 MG tablet TAKE 1 TABLET (5 MG TOTAL) BY MOUTH DAILY. 30 tablet 0   aspirin  EC 81 MG tablet Take 81 mg by mouth daily.     Budeson-Glycopyrrol-Formoterol (BREZTRI  AEROSPHERE) 160-9-4.8 MCG/ACT AERO Inhale 2 puffs into the lungs 2 (two) times daily. 10.7 g 11   buPROPion  (WELLBUTRIN  XL) 300 MG 24 hr tablet Take 1 tablet (300 mg total) by mouth every morning. 90 tablet 2  busPIRone  (BUSPAR ) 10 MG tablet Take 1 tablet (10 mg total) by mouth 3 (three) times daily. 270 tablet 1   cholecalciferol (VITAMIN D3) 25 MCG (1000 UNIT) tablet Take 1,000 Units by mouth daily.     metoprolol  tartrate (LOPRESSOR ) 25 MG tablet Take 25 mg by mouth 2 (two) times daily.     nicotine  (NICODERM CQ  - DOSED IN MG/24 HR) 7 mg/24hr patch Place 1 patch (7 mg total) onto the skin daily. 28 patch 0   nitroGLYCERIN  (NITROSTAT ) 0.4 MG SL tablet Place 1 tablet (0.4 mg total) under the tongue every 5 (five) minutes x 3 doses as needed for chest pain. 25 tablet 2   prasugrel  (EFFIENT ) 10 MG TABS tablet TAKE 1 TABLET BY MOUTH EVERY DAY 30 tablet 0   REPATHA  SURECLICK 140 MG/ML SOAJ INJECT 140 MG INTO THE SKIN EVERY 14 (FOURTEEN) DAYS FOR 12 DOSES. 2 mL 0   rosuvastatin  (CRESTOR ) 40 MG tablet TAKE 1 TABLET BY MOUTH EVERY DAY 90 tablet 0   sacubitril-valsartan (ENTRESTO ) 24-26 MG TAKE 1 TABLET BY MOUTH TWICE A DAY 60 tablet 0   traZODone  (DESYREL ) 100 MG tablet Take 1 tablet (100 mg total) by mouth at bedtime. 30 tablet 2   vitamin B-12 (CYANOCOBALAMIN) 500 MCG tablet Take 500 mcg by mouth daily.     No current facility-administered medications for this visit.     Musculoskeletal: Strength & Muscle Tone: within normal limits Gait & Station: normal Patient leans: N/A  Psychiatric  Specialty Exam: Review of Systems  All other systems reviewed and are negative.   Last menstrual period 05/25/2016.There is no height or weight on file to calculate BMI.  General Appearance: Casual and Fairly Groomed  Eye Contact:  Good  Speech:  Clear and Coherent  Volume:  Normal  Mood:  Euthymic  Affect:  Congruent  Thought Process:  Goal Directed  Orientation:  Full (Time, Place, and Person)  Thought Content: WDL   Suicidal Thoughts:  No  Homicidal Thoughts:  No  Memory:  Immediate;   Good Recent;   Good Remote;   NA  Judgement:  Good  Insight:  Fair  Psychomotor Activity:  Normal  Concentration:  Concentration: Good and Attention Span: Good  Recall:  Good  Fund of Knowledge: Good  Language: Good  Akathisia:  No  Handed:  Right  AIMS (if indicated): not done  Assets:  Communication Skills Desire for Improvement Physical Health Resilience Social Support Talents/Skills  ADL's:  Intact  Cognition: WNL  Sleep:  Good   Screenings: PHQ2-9    Flowsheet Row Video Visit from 01/06/2022 in [facility] at [place] Video Visit from 10/06/2021 in [facility] at [place] Video Visit from 07/02/2021 in [facility] at [place] Video Visit from 12/31/2020 in [facility] at [facility] Total Score 0 1 0 1      Flowsheet Row ED to Hosp-Admission (Discharged) from 01/08/2022 in [facility] Video Visit from 01/06/2022 in [facility] at [place] Video Visit from 10/06/2021 in [facility] at [place]  C-SSRS RISK CATEGORY No Risk No Risk No Risk        Assessment and Plan: This patient is a 58 year old female with a history of ADHD depression anxiety.  She no longer takes ADHD medications because of problems with her heart but she focuses well enough to function at work.  She is doing  well on her current regimen for depression and anxiety.  She will continue Wellbutrin  XL 300 mg daily for depression, BuSpar  10 mg 3 times daily in addition to Xanax  1 mg 3 times daily as needed for anxiety and trazodone  100 mg at bedtime for sleep.  She will return to see me in 3 months  Collaboration of Care: Collaboration of Care: Other provider involved in patient's care AEB notes are shared with cardiology on the epic system  Patient/Guardian was advised Release of Information must be obtained prior to any record release in order to collaborate their care with an outside provider. Patient/Guardian was advised if they have not already done so to contact the registration department to sign all necessary forms in order for us  to release information regarding their care.   Consent: Patient/Guardian gives verbal consent for treatment and assignment of benefits for services provided during this visit. Patient/Guardian expressed understanding and agreed to proceed.    [name], MD 09/28/2023, 3:06 PM

## 2023-09-28 NOTE — Telephone Encounter (Signed)
 yes

## 2023-09-28 NOTE — Telephone Encounter (Signed)
 [facility] [place] [facility] wanting to make sure that provider is aware that patient is taking hydrocodone with Acetaminophen ? Plus if it is still okay for them to fill the Xanax ?  [phone]..Davena the pharmacist

## 2023-09-30 ENCOUNTER — Other Ambulatory Visit: Payer: Self-pay | Admitting: Student

## 2023-10-08 ENCOUNTER — Other Ambulatory Visit: Payer: Self-pay | Admitting: Cardiovascular Disease

## 2023-10-11 MED ORDER — AMLODIPINE BESYLATE 5 MG PO TABS: 5.0000 mg | ORAL_TABLET | Freq: Every day | ORAL | 0 refills | Status: DC

## 2023-10-11 NOTE — Addendum Note (Signed)
 Addended by: Debrah Fan, Elber Galyean L on: 10/11/2023 09:22 AM   Modules accepted: Orders

## 2023-10-13 ENCOUNTER — Encounter: Payer: Self-pay | Admitting: *Deleted

## 2023-10-13 DIAGNOSIS — Z006 Encounter for examination for normal comparison and control in clinical research program: Secondary | ICD-10-CM

## 2023-10-13 NOTE — Research (Signed)
 84 Week Follow-Up Visit Completed*   []  Not Necessary, No Potential Adverse Events Or Medication Issues Reported On Completed Subject Questionnaire   [x]  Yes, Contact With Subject/Alternate Contact Completed   []  Yes, No Contact With Subject/Alternate Contact Completed, But Electronic Health Record Was Reviewed   []  No, Unable To Contact Subject/Alternate Contact   Have you reviewed Ongoing medications on the Targeted Concomitant Medication form and updated the form as needed?   [x]  Yes   []  No   Subject Status*   [x]  Continuing In Follow-up   []  At Risk For Lost To Follow-up   []  Withdrawal From All Future Study Activities Including Passive Follow-up By Electronic Health Record Review Or Contact With Healthcare Provider Or Family Member/Friend   []  Death   Vital Status*   [x]  Alive   []  Deceased   []  Unknown   Last Known To Be Alive Source*   [x]  Subject Completed Follow-up Questionnaire/Seen In Person/Via Telephone Contact   []  Family Member or Caretaker   []  Primary Physician Or Medical Records   []  Publicly Available Source   []  Other  Date of last dose taken   10-Oct-2023  Over the last 12 weeks did the subject miss any doses? No   Over the last 12 weeks did the subject restart Evolocumab  after an interruption? No

## 2023-10-21 ENCOUNTER — Other Ambulatory Visit: Payer: Self-pay | Admitting: Nurse Practitioner

## 2023-10-21 NOTE — Telephone Encounter (Signed)
 Pharmacy aware

## 2023-10-22 ENCOUNTER — Other Ambulatory Visit: Payer: Self-pay | Admitting: Nurse Practitioner

## 2023-10-22 NOTE — Telephone Encounter (Signed)
*  STAT* If patient is at the pharmacy, call can be transferred to refill team.   1. Which medications need to be refilled? (please list name of each medication and dose if known) Prasugrel    2. Would you like to learn more about the convenience, safety, & potential cost savings by using the [facility]?     3. Are you open to using the [facility] (Type [facility].  Which  pharmacy/location (including street and city if local pharmacy) is medication to be sent to[facility] [place]   5. Do they need a 30 day or 90 day supply? Enough until her appointment on 12-17-23- please call  in today- out of medicine

## 2023-10-25 ENCOUNTER — Other Ambulatory Visit: Payer: Self-pay | Admitting: Nurse Practitioner

## 2023-10-25 ENCOUNTER — Other Ambulatory Visit: Payer: Self-pay | Admitting: Student

## 2023-10-25 ENCOUNTER — Other Ambulatory Visit: Payer: Self-pay | Admitting: Cardiovascular Disease

## 2023-11-05 ENCOUNTER — Other Ambulatory Visit: Payer: Self-pay | Admitting: Nurse Practitioner

## 2023-11-08 ENCOUNTER — Other Ambulatory Visit: Payer: Self-pay

## 2023-11-09 ENCOUNTER — Other Ambulatory Visit: Payer: Self-pay

## 2023-11-09 NOTE — Progress Notes (Signed)
 [facility], dis-enrolling.

## 2023-11-17 ENCOUNTER — Other Ambulatory Visit: Payer: Self-pay | Admitting: Nurse Practitioner

## 2023-11-28 ENCOUNTER — Other Ambulatory Visit: Payer: Self-pay | Admitting: Nurse Practitioner

## 2023-12-05 ENCOUNTER — Other Ambulatory Visit: Payer: Self-pay | Admitting: Nurse Practitioner

## 2023-12-17 ENCOUNTER — Ambulatory Visit: Admitting: Nurse Practitioner

## 2023-12-20 ENCOUNTER — Telehealth (HOSPITAL_COMMUNITY): Payer: Self-pay

## 2023-12-20 ENCOUNTER — Other Ambulatory Visit (HOSPITAL_COMMUNITY): Payer: Self-pay | Admitting: Psychiatry

## 2023-12-20 ENCOUNTER — Other Ambulatory Visit (HOSPITAL_COMMUNITY): Payer: Self-pay | Admitting: Pharmacist

## 2023-12-20 ENCOUNTER — Other Ambulatory Visit (HOSPITAL_COMMUNITY): Payer: Self-pay

## 2023-12-20 ENCOUNTER — Encounter: Payer: Self-pay | Admitting: *Deleted

## 2023-12-20 ENCOUNTER — Other Ambulatory Visit: Payer: Self-pay

## 2023-12-20 DIAGNOSIS — I2102 ST elevation (STEMI) myocardial infarction involving left anterior descending coronary artery: Secondary | ICD-10-CM

## 2023-12-20 DIAGNOSIS — E785 Hyperlipidemia, unspecified: Secondary | ICD-10-CM

## 2023-12-20 DIAGNOSIS — I2510 Atherosclerotic heart disease of native coronary artery without angina pectoris: Secondary | ICD-10-CM

## 2023-12-20 DIAGNOSIS — Z006 Encounter for examination for normal comparison and control in clinical research program: Secondary | ICD-10-CM

## 2023-12-20 MED ORDER — REPATHA SURECLICK 140 MG/ML ~~LOC~~ SOAJ
140.0000 mg | SUBCUTANEOUS | 3 refills | Status: AC
  Filled 2023-12-20: qty 12, 168d supply, fill #0
  Filled 2024-05-19: qty 12, 168d supply, fill #1

## 2023-12-20 MED ORDER — ALPRAZOLAM 1 MG PO TABS: 1.0000 mg | ORAL_TABLET | Freq: Three times a day (TID) | ORAL | 2 refills | Status: DC | PRN

## 2023-12-20 NOTE — Telephone Encounter (Signed)
 Sent, but can't be filled before 8/27

## 2023-12-20 NOTE — Progress Notes (Signed)
 Specialty Pharmacy Initial Fill Coordination Note  Jean Nelson is a 58 y.o. female contacted today regarding initial fill of specialty medication(s) Evolocumab  (Repatha  SureClick)   Patient requested Delivery   Delivery date: 12/22/23   Verified address: [address]   Medication will be filled on 8/19.   Patient is aware of $0 copayment.

## 2023-12-20 NOTE — Research (Signed)
 Week 96 Follow-Up Visit Completed*   []  Not Necessary, No Potential Adverse Events Or Medication Issues Reported On Completed Subject Questionnaire   [x]  Yes, Contact With Subject/Alternate Contact Completed   []  Yes, No Contact With Subject/Alternate Contact Completed, But Electronic Health Record Was Reviewed   []  No, Unable To Contact Subject/Alternate Contact   Have you reviewed Ongoing medications on the Targeted Concomitant Medication form and updated the form as needed?   [x]  Yes   []  No   Subject Status*   []  Continuing In Follow-up   []  At Risk For Lost To Follow-up   []  Withdrawal From All Future Study Activities Including Passive Follow-up By Electronic Health Record Review Or Contact With Healthcare Provider Or Family Member/Friend   []  Death   Vital Status*   [x]  Alive   []  Deceased   []  Unknown   Last Known To Be Alive Source*   [x]  Subject Completed Follow-up Questionnaire/Seen In Person/Via Telephone Contact   []  Family Member or Caretaker   []  Primary Physician Or Medical Records   []  Publicly Available Source   []  Other  Date of last dose taken  12-Dec-2023  Over the last 12 weeks did the subject miss any doses? no  Over the last 12 weeks did the subject restart Evolocumab  after an interruption?no

## 2023-12-20 NOTE — Telephone Encounter (Signed)
 Pt called in requesting a refill on her ALPRAZolam  (XANAX ) 1 MG [facility] [place] [facility]05/24. Please advise.

## 2023-12-20 NOTE — Telephone Encounter (Signed)
 Pt aware and verbalized understanding.

## 2023-12-21 ENCOUNTER — Other Ambulatory Visit: Payer: Self-pay

## 2023-12-22 ENCOUNTER — Other Ambulatory Visit: Payer: Self-pay | Admitting: Student

## 2023-12-22 ENCOUNTER — Other Ambulatory Visit (HOSPITAL_COMMUNITY): Payer: Self-pay

## 2023-12-22 ENCOUNTER — Other Ambulatory Visit: Payer: Self-pay | Admitting: Nurse Practitioner

## 2023-12-23 ENCOUNTER — Other Ambulatory Visit: Payer: Self-pay

## 2023-12-24 NOTE — Telephone Encounter (Signed)
 Pt has an upcoming appt on 02/14/24 with Almarie Miriam PIETY. Pt has not been seen since 2023 and has had numerous cancellations. Would Provider like to refill pt's medications again until appt time? Please address

## 2023-12-28 ENCOUNTER — Telehealth: Payer: Self-pay | Admitting: Pharmacy Technician

## 2023-12-28 ENCOUNTER — Other Ambulatory Visit (HOSPITAL_COMMUNITY): Payer: Self-pay

## 2023-12-28 MED ORDER — ENTRESTO 24-26 MG PO TABS: 1.0000 | ORAL_TABLET | Freq: Two times a day (BID) | ORAL | 1 refills | Status: DC

## 2023-12-28 NOTE — Telephone Encounter (Signed)
 Pt's medication was sent to pt's pharmacy as requested. Confirmation received.

## 2023-12-28 NOTE — Telephone Encounter (Signed)
 Hi, insurance will not pay for generic. They will pay for brand but they said they would need a new prescription sent in stating daw Y1 prescriber requesting brand. [facility]rest[facility] requesting brand? Thank you   Insurance will pay for brand as ran as 02 -patient requested. She has 2 insurances. Test claim came back 4.00  [facility] [phone] and they said the 02 does not work for them but 01-daw per prescriber will work. They said they would need a new prescription sent in stating daw Y1 prescriber requesting brand

## 2023-12-28 NOTE — Addendum Note (Signed)
 Addended by: BLUFORD RAMP D on: 12/28/2023 12:39 PM   Modules accepted: Orders

## 2024-01-05 ENCOUNTER — Telehealth (INDEPENDENT_AMBULATORY_CARE_PROVIDER_SITE_OTHER): Admitting: Psychiatry

## 2024-01-05 ENCOUNTER — Encounter (HOSPITAL_COMMUNITY): Payer: Self-pay | Admitting: Psychiatry

## 2024-01-05 DIAGNOSIS — F411 Generalized anxiety disorder: Secondary | ICD-10-CM | POA: Diagnosis not present

## 2024-01-05 MED ORDER — BUPROPION HCL ER (XL) 300 MG PO TB24: 300.0000 mg | ORAL_TABLET | ORAL | 2 refills | Status: AC

## 2024-01-05 MED ORDER — TRAZODONE HCL 100 MG PO TABS: 100.0000 mg | ORAL_TABLET | Freq: Every day | ORAL | 2 refills | Status: AC

## 2024-01-05 MED ORDER — ALPRAZOLAM 1 MG PO TABS: 1.0000 mg | ORAL_TABLET | Freq: Three times a day (TID) | ORAL | 2 refills | Status: AC | PRN

## 2024-01-05 MED ORDER — BUSPIRONE HCL 10 MG PO TABS: 10.0000 mg | ORAL_TABLET | Freq: Three times a day (TID) | ORAL | 1 refills | Status: AC

## 2024-01-05 NOTE — Progress Notes (Signed)
 Virtual Visit via Video Note  I connected with Jean Nelson on 01/05/24 at  1:20 PM EDT by a video enabled telemedicine application and verified that I am speaking with the correct person using two identifiers.  Location: Patient: home Provider: office   I discussed the limitations of evaluation and management by telemedicine and the availability of in person appointments. The patient expressed understanding and agreed to proceed.      I discussed the assessment and treatment plan with the patient. The patient was provided an opportunity to ask questions and all were answered. The patient agreed with the plan and demonstrated an understanding of the instructions.   The patient was advised to call back or seek an in-person evaluation if the symptoms worsen or if the condition fails to improve as anticipated.  I provided 20 minutes of non-face-to-face time during this encounter.   Barnie Gull, MD  [facility] MD/PA/NP OP Progress Note  01/05/2024 1:33 PM Jean Nelson  MRN:  [id number]  Chief Complaint:  Chief Complaint  Patient presents with   Depression   Anxiety   Follow-up   HPI: This patient is a 58 year old female lives with her 2 grandchildren and her son in [place]. She is working part-time at a [employer].   The patient returns for follow-up after 3 months regarding her depression and anxiety.  The patient states that she continues to do well.  She continues to work at [employer] and she enjoys the job.  She likes to stay busy.  She is sleeping well and she denies significant depression anxiety thoughts of self-harm or suicide.  She denies any change in her health status Visit Diagnosis:    ICD-10-CM   1. Generalized anxiety disorder  F41.1       Past Psychiatric History: none  Past Medical History:  Past Medical History:  Diagnosis Date   Anxiety    Chronic back pain    Essential hypertension    Heart attack (HCC)    Plantar fasciitis    Sciatica     Past  Surgical History:  Procedure Laterality Date   APPENDECTOMY     CARDIAC CATHETERIZATION     CORONARY/GRAFT ACUTE MI REVASCULARIZATION N/A 01/08/2022   Procedure: Coronary/Graft Acute MI Revascularization;  Surgeon: Dann Candyce RAMAN, MD;  Location: [facility] INVASIVE CV LAB;  Service: Cardiovascular;  Laterality: N/A;   LEFT HEART CATH AND CORONARY ANGIOGRAPHY N/A 01/08/2022   Procedure: LEFT HEART CATH AND CORONARY ANGIOGRAPHY;  Surgeon: Dann Candyce RAMAN, MD;  Location: [facility] INVASIVE CV LAB;  Service: Cardiovascular;  Laterality: N/A;   TUBAL LIGATION      Family Psychiatric History: See below  Family History:  Family History  Problem Relation Age of Onset   ADD / ADHD Mother    Hypertension Mother    Alcohol abuse Maternal Uncle    Depression Maternal Grandfather    Alcohol abuse Maternal Grandfather     Social History:  Social History   Socioeconomic History   Marital status: Married    Spouse name: Not on file   Number of children: Not on file   Years of education: Not on file   Highest education level: Not on file  Occupational History   Not on file  Tobacco Use   Smoking status: Every Day    Current packs/day: 0.50    Types: Cigarettes   Smokeless tobacco: Never   Tobacco comments:    1/2 PPD PER PATIENT 06/29/22  Vaping Use  Vaping status: Never Used  Substance and Sexual Activity   Alcohol use: Yes    Comment: occasionally    Drug use: No   Sexual activity: Yes    Birth control/protection: Surgical  Other Topics Concern   Not on file  Social History Narrative   Not on file   Social Drivers of Health   Financial Resource Strain: High Risk (05/20/2022)   Received from [facility]   Overall Financial Resource Strain (CARDIA)    Difficulty of Paying Living Expenses: Hard  Food Insecurity: Food Insecurity Present (05/20/2022)   Received from [facility]   Hunger Vital Sign    Within the past 12 months, you worried that your food would run out before you  got the money to buy more.: Sometimes true    Within the past 12 months, the food you bought just didn't last and you didn't have money to get more.: Sometimes true  Transportation Needs: Unmet Transportation Needs (05/20/2022)   Received from [facility] - Transportation    Lack of Transportation (Medical): Yes    Lack of Transportation (Non-Medical): Patient declined  Physical Activity: Unknown (05/20/2022)   Received from [facility]   Exercise Vital Sign    On average, how many days per week do you engage in moderate to strenuous exercise (like a brisk walk)?: Patient declined    Minutes of Exercise per Session: Not on file  Stress: Stress Concern Present (05/20/2022)   Received from [facility] - Occupational Stress Questionnaire    Feeling of Stress : Rather much  Social Connections: Unknown (11/07/2022)   Received from [facility]   Social Network    Social Network: Not on file    Allergies:  Allergies  Allergen Reactions   Sulfa Antibiotics Swelling   Codeine Hives    Metabolic Disorder Labs: Lab Results  Component Value Date   HGBA1C 5.6 01/08/2022   MPG 114.02 01/08/2022   MPG 114.02 01/08/2022   No results found for: PROLACTIN Lab Results  Component Value Date   CHOL 115 01/08/2022   TRIG 159 (H) 01/08/2022   HDL 34 (L) 01/08/2022   CHOLHDL 3.4 01/08/2022   VLDL 32 01/08/2022   LDLCALC 49 01/08/2022   LDLCALC 34 01/08/2022   No results found for: TSH  Therapeutic Level Labs: No results found for: LITHIUM No results found for: VALPROATE No results found for: CBMZ  Current Medications: Current Outpatient Medications  Medication Sig Dispense Refill   albuterol  (PROVENTIL  HFA;VENTOLIN  HFA) 108 (90 BASE) MCG/ACT inhaler Inhale 1-2 puffs into the lungs every 6 (six) hours as needed for wheezing (Use the inhaler for the next 4 days 4 times a day then begin using only if needed for wheezing).  1 Inhaler 0   ALPRAZolam  (XANAX ) 1 MG tablet Take 1 tablet (1 mg total) by mouth 3 (three) times daily as needed for anxiety. 90 tablet 2   amLODipine  (NORVASC ) 5 MG tablet TAKE 1 TABLET (5 MG TOTAL) BY MOUTH DAILY. 30 tablet 0   aspirin  EC 81 MG tablet Take 81 mg by mouth daily.     Budeson-Glycopyrrol-Formoterol (BREZTRI  AEROSPHERE) 160-9-4.8 MCG/ACT AERO Inhale 2 puffs into the lungs 2 (two) times daily. 10.7 g 11   buPROPion  (WELLBUTRIN  XL) 300 MG 24 hr tablet Take 1 tablet (300 mg total) by mouth every morning. 90 tablet 2   busPIRone  (BUSPAR ) 10 MG tablet Take 1 tablet (10 mg total) by  mouth 3 (three) times daily. 270 tablet 1   cholecalciferol (VITAMIN D3) 25 MCG (1000 UNIT) tablet Take 1,000 Units by mouth daily.     ENTRESTO  24-26 MG Take 1 tablet by mouth 2 (two) times daily. 60 tablet 1   Evolocumab  (REPATHA  SURECLICK) 140 MG/ML SOAJ Inject 140 mg into the skin every 14 (fourteen) days. For Investigational Use Only. Inject subcutaneously into abdomen, thigh, or upper arm every 14 days. Rotate injection sites and do not inject into areas where skin is tender, bruised, or red. Please contact [facility] for any questions or concerns regarding this medication. 12 mL 3   metoprolol  tartrate (LOPRESSOR ) 25 MG tablet Take 25 mg by mouth 2 (two) times daily.     nicotine  (NICODERM CQ  - DOSED IN MG/24 HR) 7 mg/24hr patch Place 1 patch (7 mg total) onto the skin daily. 28 patch 0   nitroGLYCERIN  (NITROSTAT ) 0.4 MG SL tablet Place 1 tablet (0.4 mg total) under the tongue every 5 (five) minutes x 3 doses as needed for chest pain. 25 tablet 2   prasugrel  (EFFIENT ) 10 MG TABS tablet TAKE 1 TABLET (10 MG TOTAL) BY MOUTH DAILY. PT. MUST MAKE AN APPOINTMENT WITH CARDIOLOGIST IN ORDER TO RECEIVE FUTURE REFILLS. SECOND ATTEMPT. 30 tablet 0   rosuvastatin  (CRESTOR ) 40 MG tablet TAKE 1 TABLET BY MOUTH EVERY DAY 90 tablet 1   traZODone  (DESYREL ) 100 MG tablet Take 1 tablet (100 mg total) by  mouth at bedtime. 30 tablet 2   vitamin B-12 (CYANOCOBALAMIN) 500 MCG tablet Take 500 mcg by mouth daily.     No current facility-administered medications for this visit.     Musculoskeletal: Strength & Muscle Tone: within normal limits Gait & Station: normal Patient leans: N/A  Psychiatric Specialty Exam: Review of Systems  All other systems reviewed and are negative.   Last menstrual period 05/25/2016.There is no height or weight on file to calculate BMI.  General Appearance: Casual and Fairly Groomed  Eye Contact:  Good  Speech:  Clear and Coherent  Volume:  Normal  Mood:  Euthymic  Affect:  Congruent  Thought Process:  Goal Directed  Orientation:  Full (Time, Place, and Person)  Thought Content: WDL   Suicidal Thoughts:  No  Homicidal Thoughts:  No  Memory:  Immediate;   Good Recent;   Good Remote;   NA  Judgement:  Good  Insight:  Fair  Psychomotor Activity:  Normal  Concentration:  Concentration: Good and Attention Span: Good  Recall:  Good  Fund of Knowledge: Good  Language: Good  Akathisia:  No  Handed:  Right  AIMS (if indicated): not done  Assets:  Communication Skills Desire for Improvement Resilience Social Support Talents/Skills  ADL's:  Intact  Cognition: WNL  Sleep:  Good   Screenings: PHQ2-9    Flowsheet Row Video Visit from 01/06/2022 in [facility] [place] Video Visit from 10/06/2021 in [facility] [place] Video Visit from 07/02/2021 [facility] [place] Video Visit from 12/31/2020 [facility] Total Score 0 1 0 1   Flowsheet Row ED to Hosp-Admission (Discharged) from 01/08/2022 in [place] [facility]H CARDIOVASCULAR ICU Video Visit from 01/06/2022 in [facility] at [place] Video Visit from 10/06/2021 in [facility] at [place]  C-SSRS RISK  CATEGORY No Risk No Risk No Risk     Assessment and  Plan: This patient is a 58 year old female with a history of ADHD depression and anxiety.  She no longer takes stimulant medication but seems to focus fairly well at work.  She continues to do well on her regimen for her depression and anxiety.  She will continue Wellbutrin  XL 300 mg daily for depression, BuSpar  10 mg 3 times daily in addition to Xanax  1 mg 3 times daily as needed for anxiety and trazodone  100 mg at bedtime for sleep.  She will return to see me in 3 months  Collaboration of Care: Collaboration of Care: Other provider involved in patient's care AEB notes that shared with cardiology on the epic system  Patient/Guardian was advised Release of Information must be obtained prior to any record release in order to collaborate their care with an outside provider. Patient/Guardian was advised if they have not already done so to contact the registration department to sign all necessary forms in order for us  to release information regarding their care.   Consent: Patient/Guardian gives verbal consent for treatment and assignment of benefits for services provided during this visit. Patient/Guardian expressed understanding and agreed to proceed.    Barnie Gull, MD 01/05/2024, 1:33 PM

## 2024-02-09 ENCOUNTER — Telehealth: Payer: Self-pay | Admitting: *Deleted

## 2024-02-09 NOTE — Telephone Encounter (Signed)
 Called patient for week 108 visit for the EVOLVE study left message for patient to call back.  [place] Dangela How, Research Coordinator

## 2024-02-14 ENCOUNTER — Other Ambulatory Visit: Payer: Self-pay | Admitting: Nurse Practitioner

## 2024-02-14 ENCOUNTER — Ambulatory Visit: Payer: Self-pay | Admitting: Nurse Practitioner

## 2024-02-15 NOTE — Telephone Encounter (Signed)
 Pt has an upcoming appt on 05/08/2024 with Almarie Crate, NP. Pt has not been seen since 01/21/2022 and has had numerous cancellations. Does pt need to be seen sooner to get a refill on medication? Please address

## 2024-02-24 ENCOUNTER — Other Ambulatory Visit: Payer: Self-pay | Admitting: Nurse Practitioner

## 2024-03-08 ENCOUNTER — Telehealth: Payer: Self-pay | Admitting: *Deleted

## 2024-03-08 NOTE — Telephone Encounter (Signed)
 Called patient for  her week 108 call for the EVOLVE study left message for patient to call back.     Brisa Auth, research coordinator 03/08/2024 12:06 pm

## 2024-03-10 ENCOUNTER — Encounter: Payer: Self-pay | Admitting: *Deleted

## 2024-03-10 DIAGNOSIS — Z006 Encounter for examination for normal comparison and control in clinical research program: Secondary | ICD-10-CM

## 2024-03-10 NOTE — Research (Signed)
 Week 108 Follow-Up Visit Completed*   []  Not Necessary, No Potential Adverse Events Or Medication Issues Reported On Completed Subject Questionnaire   [x]  Yes, Contact With Subject/Alternate Contact Completed   []  Yes, No Contact With Subject/Alternate Contact Completed, But Electronic Health Record Was Reviewed   []  No, Unable To Contact Subject/Alternate Contact   Have you reviewed Ongoing medications on the Targeted Concomitant Medication form and updated the form as needed?   [x]  Yes   []  No   Subject Status*   [x]  Continuing In Follow-up   []  At Risk For Lost To Follow-up   []  Withdrawal From All Future Study Activities Including Passive Follow-up By Electronic Health Record Review Or Contact With Healthcare Provider Or Family Member/Friend   []  Death   Vital Status*   [x]  Alive   []  Deceased   []  Unknown   Last Known To Be Alive Source*   [x]  Subject Completed Follow-up Questionnaire/Seen In Person/Via Telephone Contact   []  Family Member or Caretaker   []  Primary Physician Or Medical Records   []  Publicly Available Source   []  Other  Date of last dose taken   27-Feb-2024  Over the last 12 weeks did the subject miss any doses? No   Over the last 12 weeks did the subject restart Evolocumab  after an interruption? No

## 2024-03-29 ENCOUNTER — Other Ambulatory Visit: Payer: Self-pay | Admitting: Nurse Practitioner

## 2024-04-05 NOTE — Telephone Encounter (Signed)
Patient requires appointment.

## 2024-05-08 ENCOUNTER — Ambulatory Visit: Admitting: Nurse Practitioner

## 2024-05-16 ENCOUNTER — Encounter: Payer: Self-pay | Admitting: *Deleted

## 2024-05-16 DIAGNOSIS — Z006 Encounter for examination for normal comparison and control in clinical research program: Secondary | ICD-10-CM

## 2024-05-16 NOTE — Research (Signed)
 Week 120 Follow-Up Visit Completed*   []  Not Necessary, No Potential Adverse Events Or Medication Issues Reported On Completed Subject Questionnaire   [x]  Yes, Contact With Subject/Alternate Contact Completed   []  Yes, No Contact With Subject/Alternate Contact Completed, But Electronic Health Record Was Reviewed   []  No, Unable To Contact Subject/Alternate Contact   Have you reviewed Ongoing medications on the Targeted Concomitant Medication form and updated the form as needed?   [x]  Yes   []  No   Subject Status*   [x]  Continuing In Follow-up   []  At Risk For Lost To Follow-up   []  Withdrawal From All Future Study Activities Including Passive Follow-up By Electronic Health Record Review Or Contact With Healthcare Provider Or Family Member/Friend   []  Death   Vital Status*   [x]  Alive   []  Deceased   []  Unknown   Last Known To Be Alive Source*   [x]  Subject Completed Follow-up Questionnaire/Seen In Person/Via Telephone Contact   []  Family Member or Caretaker   []  Primary Physician Or Medical Records   []  Publicly Available Source   []  Other  Date of last dose taken   14-May-2024  Over the last 12 weeks did the subject miss any doses? No   Over the last 12 weeks did the subject restart Evolocumab  after an interruption? No

## 2024-05-19 ENCOUNTER — Other Ambulatory Visit: Payer: Self-pay

## 2024-05-19 NOTE — Progress Notes (Signed)
 Specialty Pharmacy Initial Fill Coordination Note  Jean Nelson is a 59 y.o. female contacted today regarding initial fill of specialty medication(s) Evolocumab  (Repatha  SureClick)   Patient requested Delivery   Delivery date: 05/23/24   Verified address: [address]   Medication will be filled on: 05/22/24   Patient is aware of $0 copayment.

## 2024-05-22 ENCOUNTER — Other Ambulatory Visit: Payer: Self-pay

## 2024-05-28 ENCOUNTER — Other Ambulatory Visit: Payer: Self-pay | Admitting: Nurse Practitioner

## 2024-06-12 ENCOUNTER — Ambulatory Visit: Admitting: Physician Assistant
